# Patient Record
Sex: Female | Born: 1965
Health system: Southern US, Community
[De-identification: ages and names within clinical notes are randomized; demographics above are authoritative.]

## PROBLEM LIST (undated history)

## (undated) DIAGNOSIS — K219 Gastro-esophageal reflux disease without esophagitis: Secondary | ICD-10-CM

## (undated) DIAGNOSIS — F419 Anxiety disorder, unspecified: Secondary | ICD-10-CM

## (undated) DIAGNOSIS — N189 Chronic kidney disease, unspecified: Secondary | ICD-10-CM

## (undated) HISTORY — PX: LAPAROSCOPIC GASTRIC BYPASS: SUR771

## (undated) HISTORY — PX: KIDNEY STONE SURGERY: SHX686

## (undated) HISTORY — PX: ABDOMINAL HYSTERECTOMY: SHX81

## (undated) HISTORY — PX: COLONOSCOPY: SHX174

---

## 2003-01-22 HISTORY — PX: BREAST BIOPSY: SHX20

## 2005-03-13 ENCOUNTER — Ambulatory Visit: Payer: Self-pay | Admitting: Internal Medicine

## 2005-04-30 ENCOUNTER — Ambulatory Visit: Payer: Self-pay | Admitting: Internal Medicine

## 2006-08-05 ENCOUNTER — Ambulatory Visit: Payer: Self-pay | Admitting: Internal Medicine

## 2006-11-06 ENCOUNTER — Ambulatory Visit: Payer: Self-pay | Admitting: Emergency Medicine

## 2006-12-31 ENCOUNTER — Ambulatory Visit: Payer: Self-pay | Admitting: Internal Medicine

## 2007-01-06 ENCOUNTER — Ambulatory Visit: Payer: Self-pay | Admitting: Internal Medicine

## 2007-03-31 ENCOUNTER — Ambulatory Visit: Payer: Self-pay | Admitting: Family Medicine

## 2007-04-07 ENCOUNTER — Ambulatory Visit: Payer: Self-pay | Admitting: Internal Medicine

## 2007-04-10 ENCOUNTER — Ambulatory Visit: Payer: Self-pay | Admitting: Family Medicine

## 2007-04-30 ENCOUNTER — Ambulatory Visit: Payer: Self-pay | Admitting: Specialist

## 2007-05-15 ENCOUNTER — Ambulatory Visit: Payer: Self-pay | Admitting: Family Medicine

## 2007-06-11 ENCOUNTER — Ambulatory Visit: Payer: Self-pay | Admitting: Specialist

## 2007-07-13 ENCOUNTER — Ambulatory Visit: Payer: Self-pay | Admitting: Specialist

## 2008-03-23 ENCOUNTER — Ambulatory Visit: Payer: Self-pay | Admitting: Internal Medicine

## 2009-02-10 ENCOUNTER — Ambulatory Visit: Payer: Self-pay | Admitting: Gastroenterology

## 2009-02-19 IMAGING — RF DG UGI W/ KUB
2 series · 15 of 21 positions shown · non-contrast
Comparison: none

REASON FOR EXAM: dysphagia     reflux
COMMENTS:

[Series 1: run · 14 of 20 slices shown]
[im 1/20]
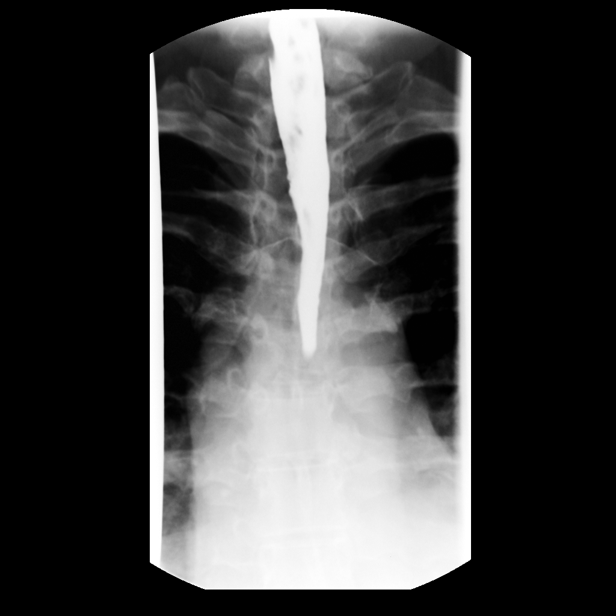
[im 3/20]
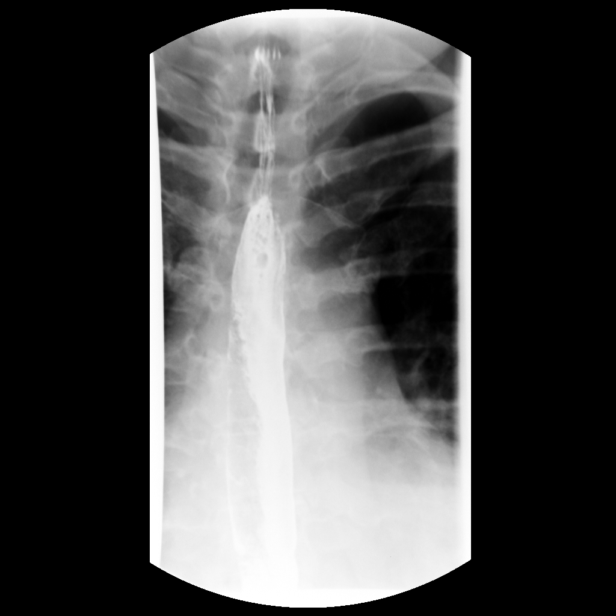
[im 4/20]
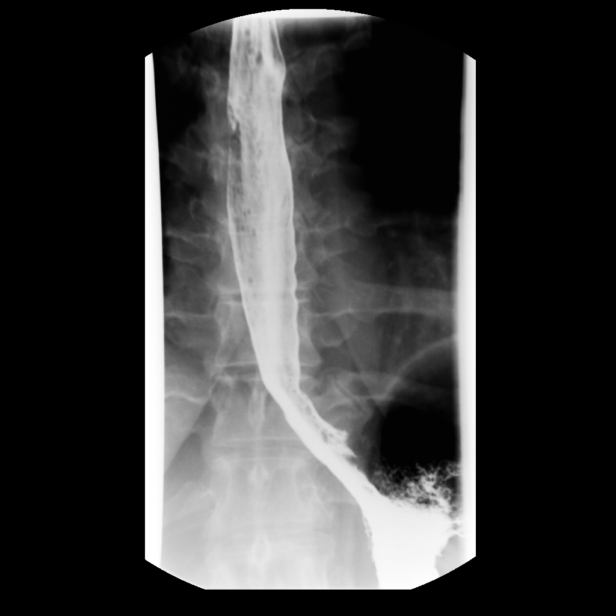
[im 5/20]
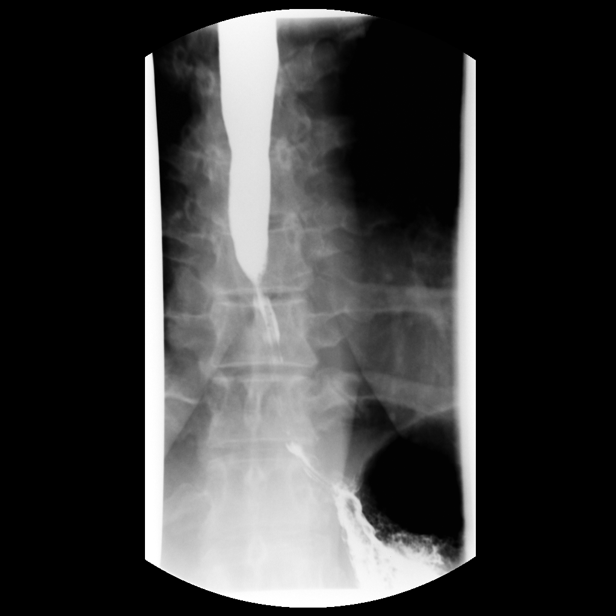
[im 7/20]
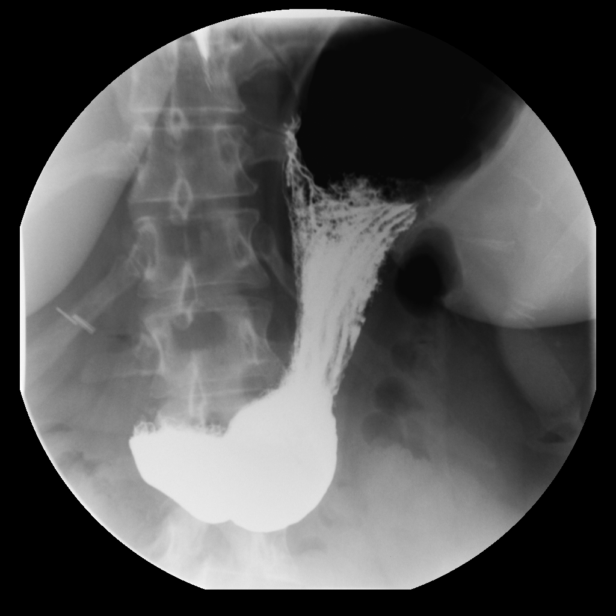
[im 8/20]
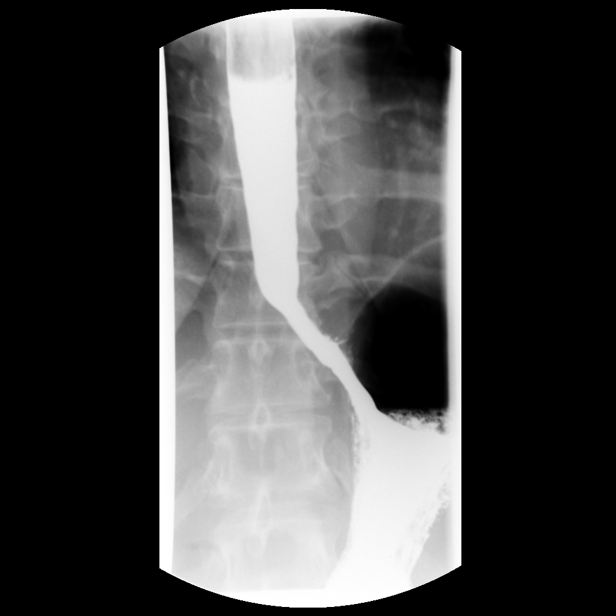
[im 10/20]
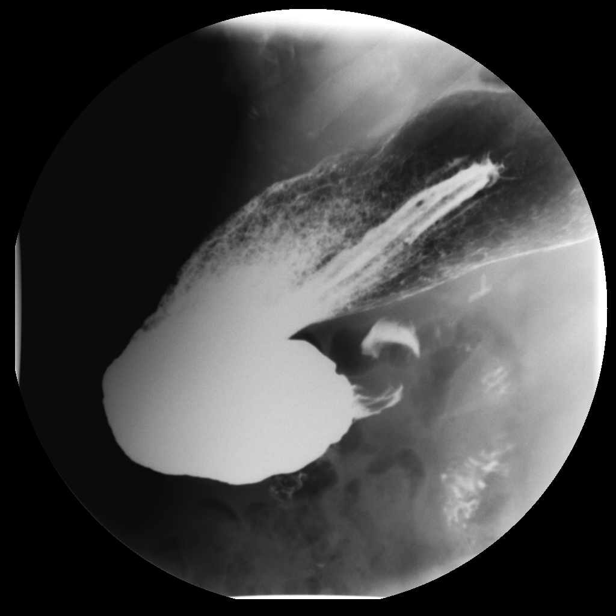
[im 11/20]
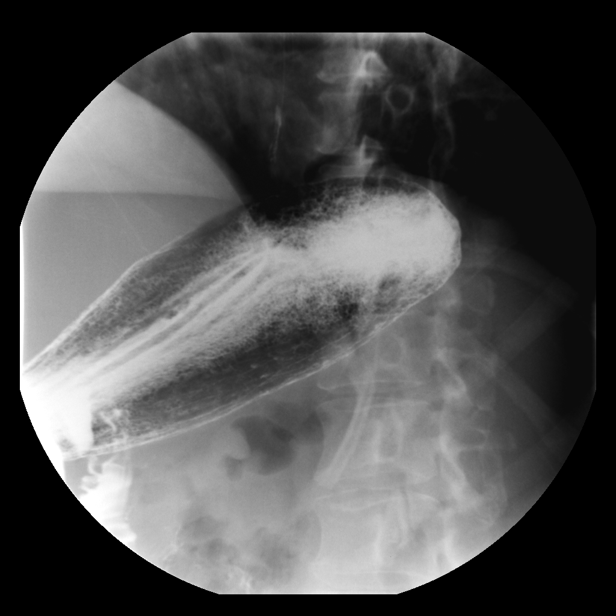
[im 12/20]
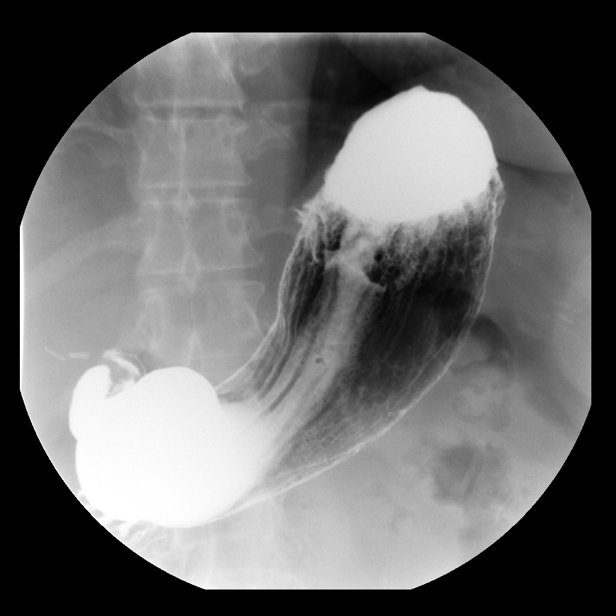
[im 14/20]
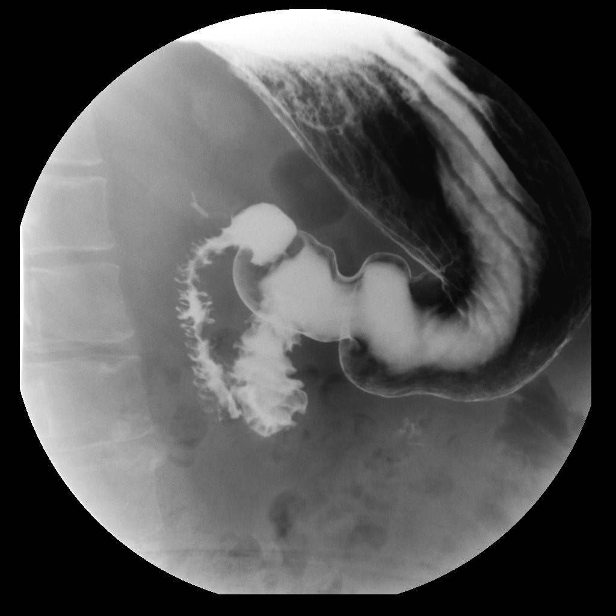
[im 15/20]
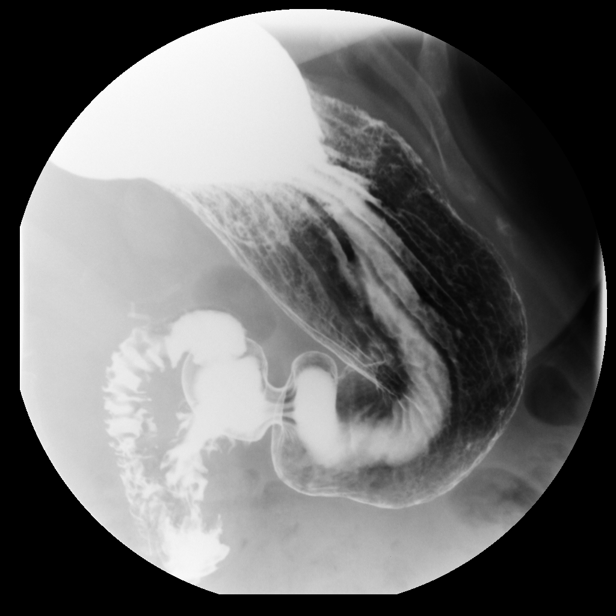
[im 17/20]
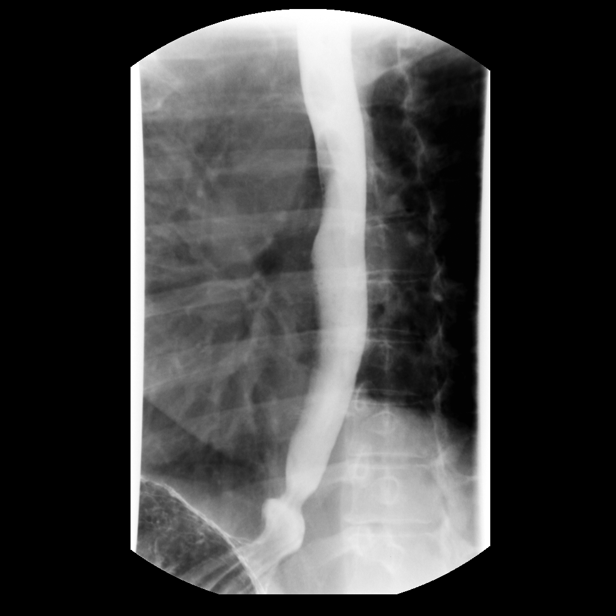
[im 18/20]
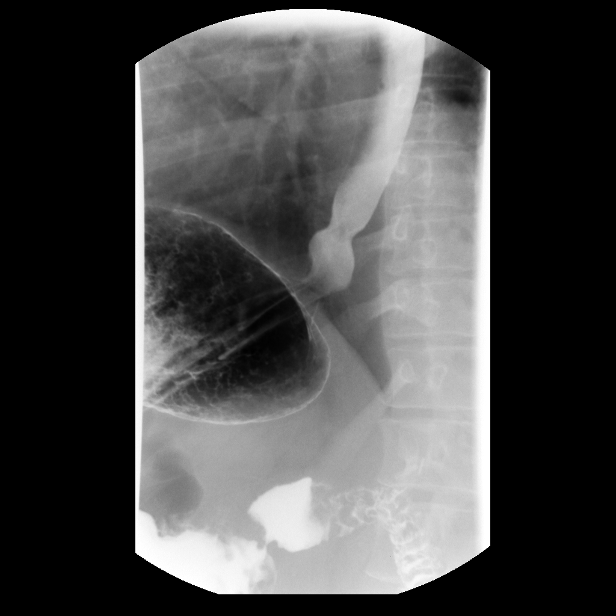
[im 19/20]
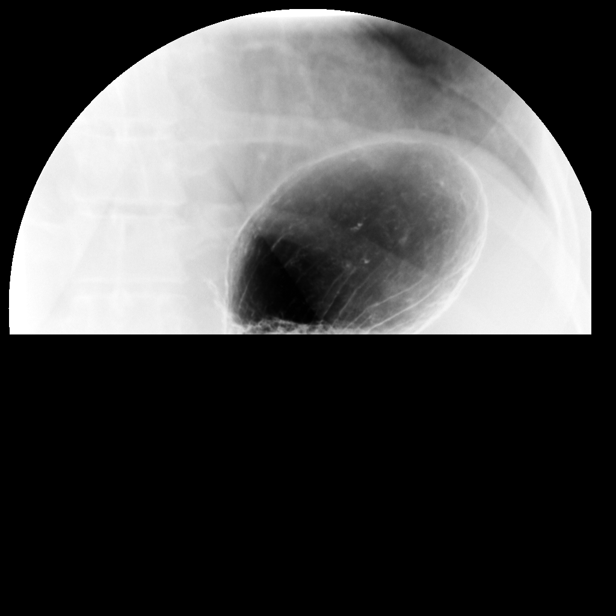

[Series 1: view not recorded · 0.17mm/px · 1 of 1 slices shown]
[im 1/1]
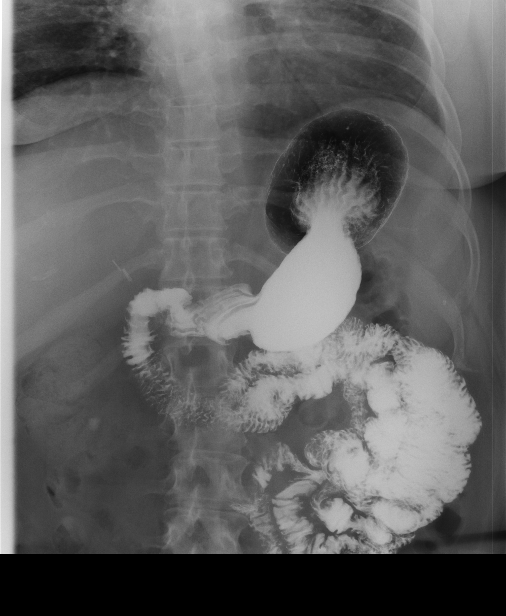

[15 of 21 positions shown; findings below may reference images not displayed]

PROCEDURE:     FL  - FL UPPER GI W/ BARIUM SWALLOW  - December 31, 2006  [DATE]

RESULT:     A double-contrast upper GI and barium swallow is performed. The
patient easily ingests the liquid barium. The esophageal mucosa and
morphology are normal. The stomach distends fully. The gastric mucosa is
unremarkable. The proximal small bowel is within normal limits. When the
patient is in a supine position there is a small sliding-type hiatal hernia.
No gastroesophageal reflux could be observed. With prone swallowing there is
proximal escape and some minimal distal esophageal spasm.
IMPRESSION: 1. No obstruction or aspiration.
2. There is a small intermittent sliding-type hiatal hernia.
3. There is no gastroesophageal reflux. There is some intermittent minimal
distal esophageal spasm.
4. Cholecystectomy clips are noted incidentally.
5. The proximal small bowel is unremarkable in appearance.

## 2009-02-24 ENCOUNTER — Ambulatory Visit: Payer: Self-pay | Admitting: Gastroenterology

## 2009-05-01 ENCOUNTER — Ambulatory Visit: Payer: Self-pay | Admitting: Specialist

## 2009-08-28 ENCOUNTER — Ambulatory Visit: Payer: Self-pay | Admitting: Internal Medicine

## 2009-12-04 ENCOUNTER — Ambulatory Visit (HOSPITAL_COMMUNITY)
Admission: RE | Admit: 2009-12-04 | Discharge: 2009-12-05 | Payer: Self-pay | Source: Home / Self Care | Admitting: Urology

## 2010-04-03 LAB — CBC
HCT: 40 % (ref 36.0–46.0)
Hemoglobin: 13.8 g/dL (ref 12.0–15.0)
MCH: 31.7 pg (ref 26.0–34.0)
MCHC: 34.6 g/dL (ref 30.0–36.0)
MCV: 91.7 fL (ref 78.0–100.0)
Platelets: 224 10*3/uL (ref 150–400)
RBC: 4.36 MIL/uL (ref 3.87–5.11)
RDW: 12.8 % (ref 11.5–15.5)
WBC: 7.9 10*3/uL (ref 4.0–10.5)

## 2010-04-03 LAB — SURGICAL PCR SCREEN
MRSA, PCR: NEGATIVE
Staphylococcus aureus: NEGATIVE

## 2010-09-13 ENCOUNTER — Ambulatory Visit: Payer: Self-pay | Admitting: Internal Medicine

## 2011-02-22 ENCOUNTER — Ambulatory Visit: Payer: Self-pay

## 2011-04-16 ENCOUNTER — Ambulatory Visit: Payer: Self-pay

## 2011-04-16 IMAGING — CR DG BE W/ AIR HIGH DENSITY
1 series · 8 of 10 positions shown · non-contrast
Comparison: none

REASON FOR EXAM: incomplete colonscopy scr
COMMENTS:

[Series 1: view not recorded · 0.17mm/px · 8 of 13 slices shown]
[im 1/13]
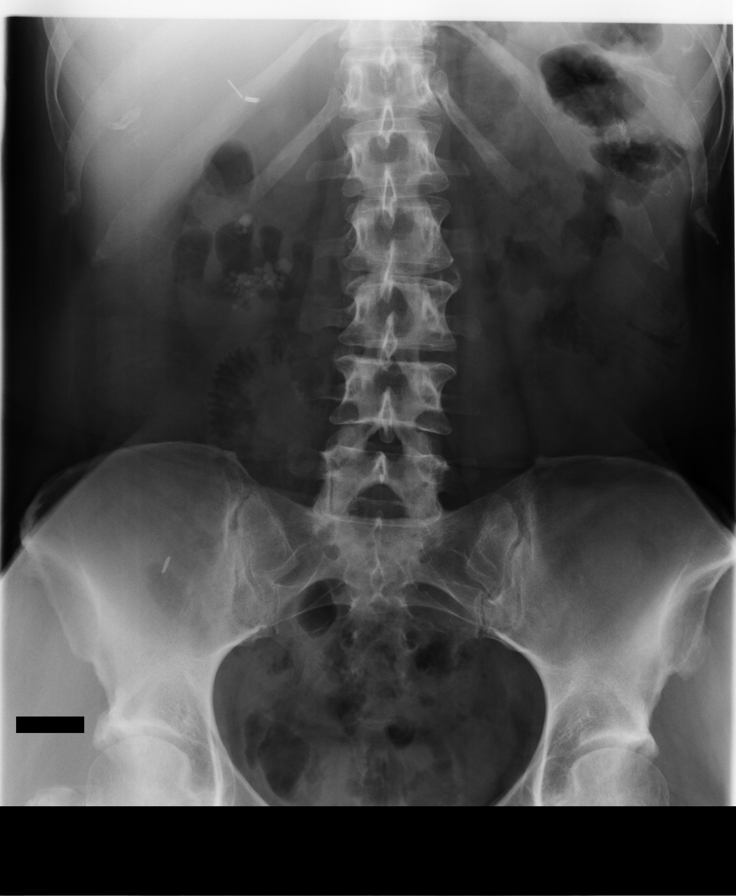
[im 2/13]
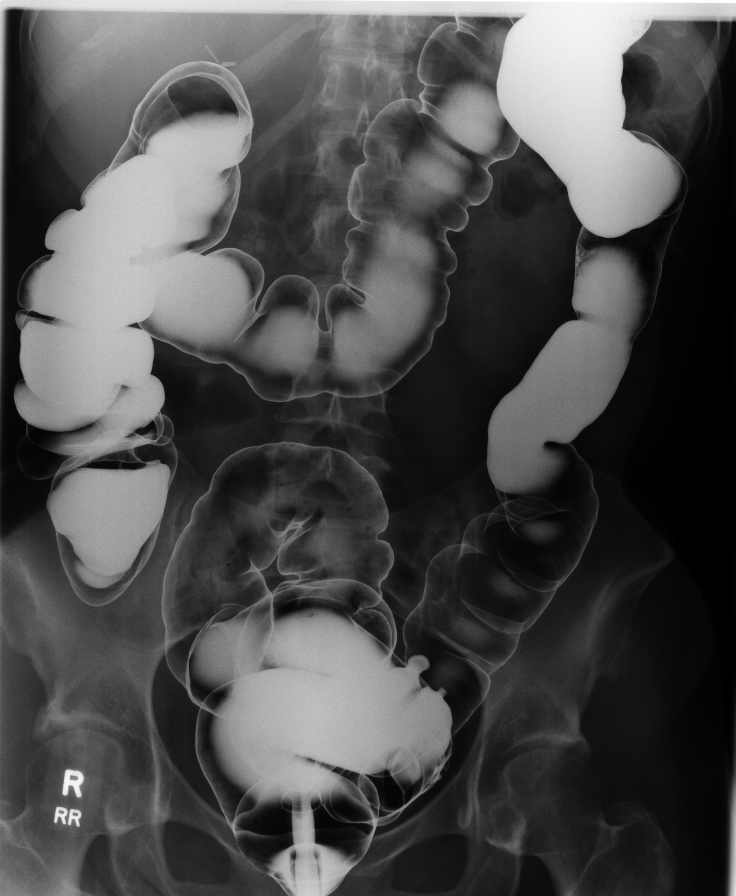
[im 3/13]
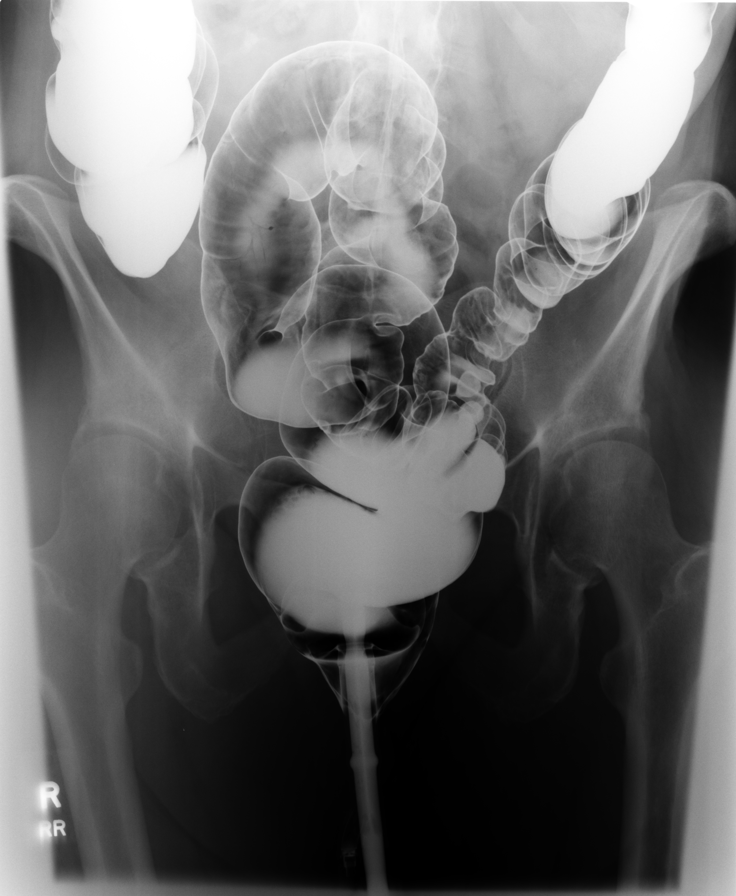
[im 5/13]
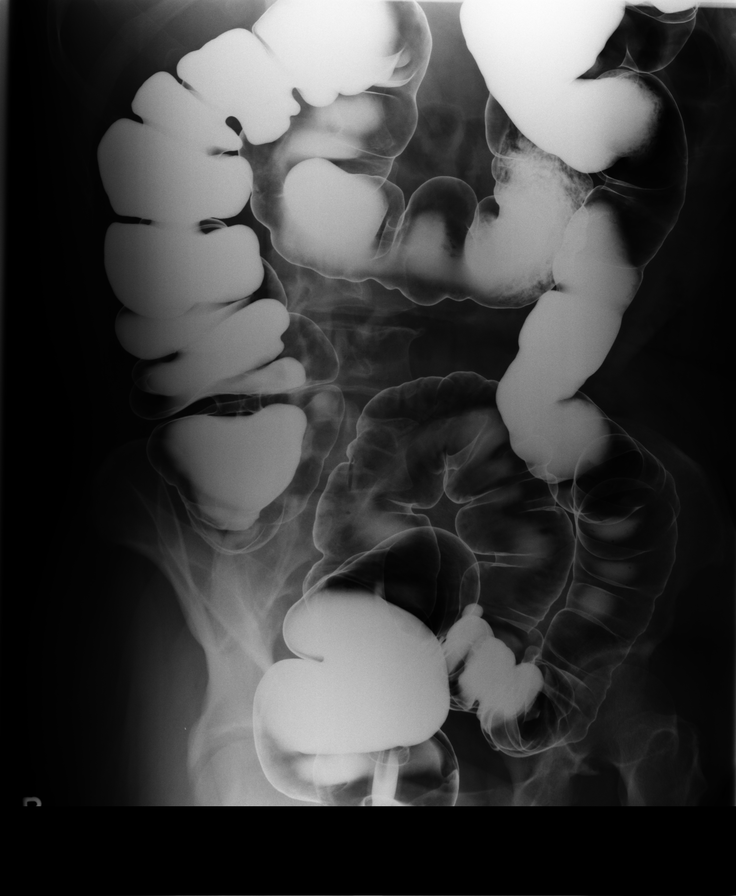
[im 6/13]
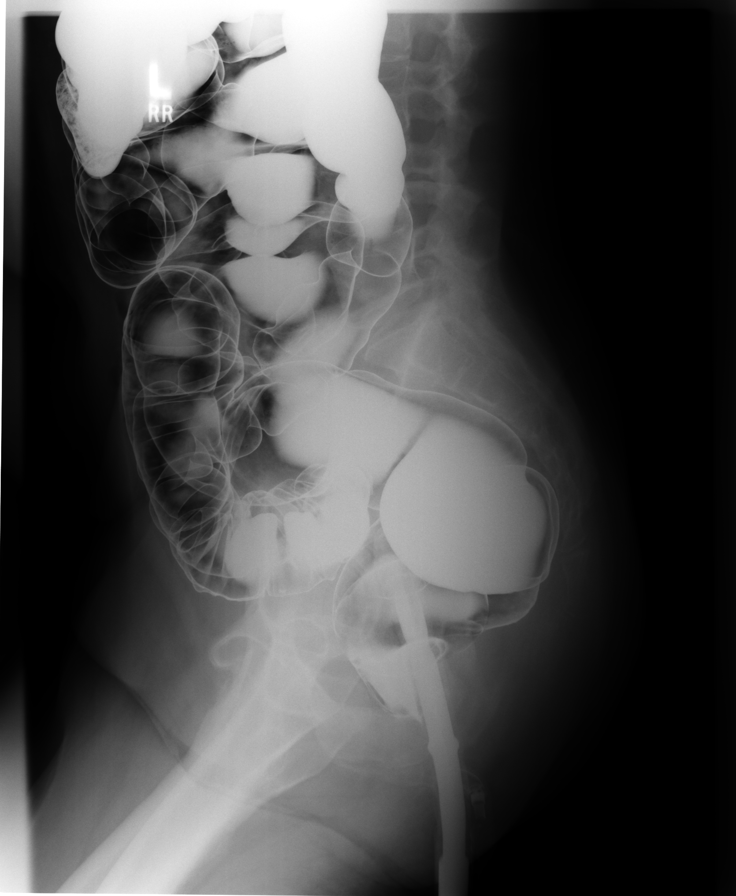
[im 7/13]
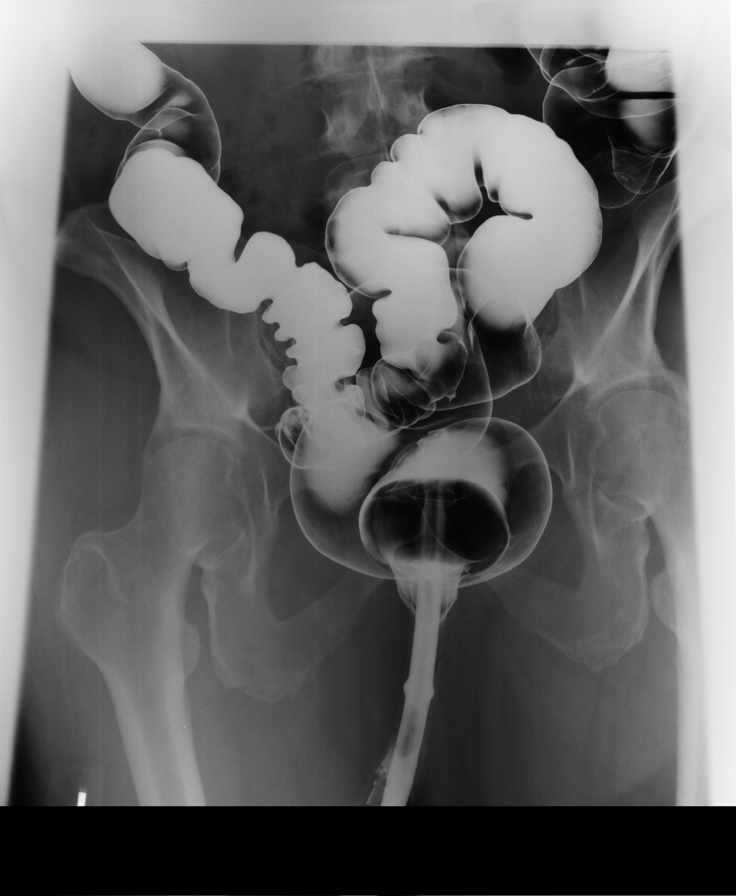
[im 9/13]
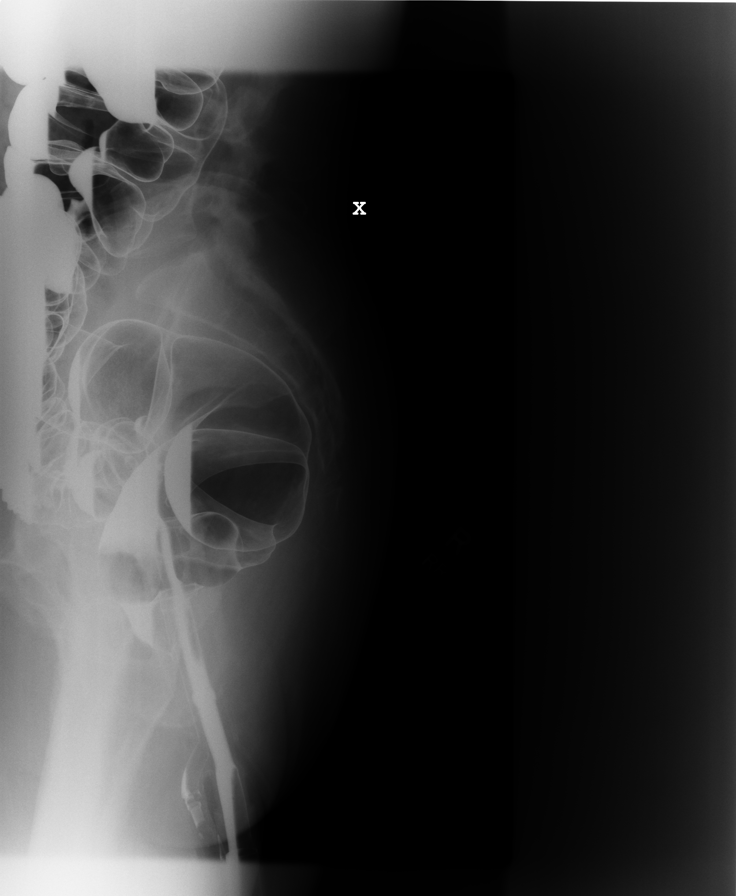
[im 10/13]
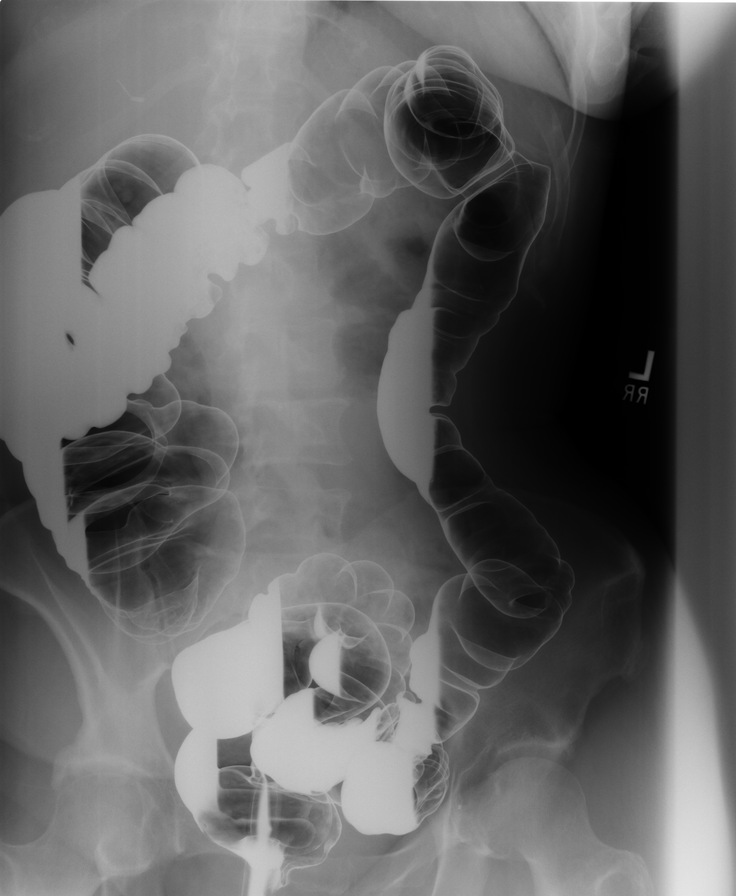

[8 of 10 positions shown; findings below may reference images not displayed]

PROCEDURE:     FL  - FL BARIUM ENEMA WITH AIR COLON  - February 24, 2009  [DATE]

RESULT:     The anticipated procedure was discussed with Ms. Siviora. She
voiced her willingness to proceed. The scout film reveals numerous calcified
stones projecting over the right renal collecting systems. There are
surgical clips in the gallbladder fossa and over the right iliac bone. There
is a moderate amount of gas within bowel but no significant stool was
identified.

The barium catheter balloon was inflated under fluoroscopic guidance. Barium
was then instilled into the colon followed by drainage in an insufflation of
air. Adequate coating and distention of the entire large bowel was obtained.

There are redundant loops of bowel in the rectosigmoid region and in the
region of the hepatic flexure. No annular constricting lesions are
identified. There are a few sigmoid diverticula without evidence of acute
diverticulitis. The haustral pattern appears normal. The appendix is
surgically absent. No significant reflux into the terminal ileum is
demonstrated. The cecum is normal in contour.
IMPRESSION: 1. I do not see evidence of annular constricting lesions nor of intraluminal
polypoid masses.
2. There are scattered diverticula noted predominantly in the sigmoid
region, but there is no evidence of acute diverticulitis nor other forms of
colitis.
3. I see no extrinsic impression upon the colonic loops.

## 2011-09-24 ENCOUNTER — Ambulatory Visit: Payer: Self-pay

## 2011-10-21 ENCOUNTER — Ambulatory Visit: Payer: Self-pay | Admitting: Urology

## 2011-10-21 DIAGNOSIS — N23 Unspecified renal colic: Secondary | ICD-10-CM | POA: Insufficient documentation

## 2011-10-21 DIAGNOSIS — N200 Calculus of kidney: Secondary | ICD-10-CM | POA: Insufficient documentation

## 2011-10-21 DIAGNOSIS — M545 Low back pain, unspecified: Secondary | ICD-10-CM | POA: Insufficient documentation

## 2012-06-30 ENCOUNTER — Ambulatory Visit: Payer: Self-pay | Admitting: Family Medicine

## 2012-09-07 ENCOUNTER — Ambulatory Visit: Payer: Self-pay | Admitting: Physician Assistant

## 2012-09-07 LAB — URINALYSIS, COMPLETE
Glucose,UR: NEGATIVE mg/dL (ref 0–75)
Ketone: NEGATIVE
Nitrite: POSITIVE
Ph: 6 (ref 4.5–8.0)
Protein: 30
RBC,UR: >30 /HPF (ref 0–5)
Specific Gravity: 1.02 (ref 1.003–1.030)

## 2012-09-09 LAB — URINE CULTURE

## 2012-09-30 ENCOUNTER — Ambulatory Visit: Payer: Self-pay | Admitting: Physician Assistant

## 2012-10-22 ENCOUNTER — Ambulatory Visit: Payer: Self-pay | Admitting: Urology

## 2012-11-05 ENCOUNTER — Ambulatory Visit: Payer: Self-pay | Admitting: Urology

## 2012-12-24 ENCOUNTER — Ambulatory Visit: Payer: Self-pay

## 2012-12-30 ENCOUNTER — Ambulatory Visit: Payer: Self-pay

## 2013-09-06 ENCOUNTER — Ambulatory Visit: Payer: Self-pay | Admitting: Physician Assistant

## 2013-09-06 LAB — RAPID STREP-A WITH REFLX: Micro Text Report: NEGATIVE

## 2013-09-08 LAB — BETA STREP CULTURE(ARMC)

## 2013-10-06 ENCOUNTER — Ambulatory Visit: Payer: Self-pay | Admitting: Internal Medicine

## 2014-01-12 ENCOUNTER — Ambulatory Visit: Payer: Self-pay | Admitting: Physician Assistant

## 2014-10-13 ENCOUNTER — Other Ambulatory Visit: Payer: Self-pay | Admitting: Physician Assistant

## 2014-10-13 DIAGNOSIS — R109 Unspecified abdominal pain: Secondary | ICD-10-CM

## 2014-10-13 DIAGNOSIS — R319 Hematuria, unspecified: Secondary | ICD-10-CM

## 2014-10-19 ENCOUNTER — Ambulatory Visit
Admission: RE | Admit: 2014-10-19 | Discharge: 2014-10-19 | Disposition: A | Discharge status: Home / Self Care | Payer: BLUE CROSS/BLUE SHIELD | Source: Ambulatory Visit | Attending: Physician Assistant | Admitting: Physician Assistant

## 2014-10-19 DIAGNOSIS — R319 Hematuria, unspecified: Secondary | ICD-10-CM

## 2014-10-19 DIAGNOSIS — R109 Unspecified abdominal pain: Secondary | ICD-10-CM | POA: Insufficient documentation

## 2014-12-12 IMAGING — CR DG ABDOMEN 1V
1 series · 2 of 2 positions shown · non-contrast
Comparison: none

REASON FOR EXAM: pre-op
COMMENTS:

[Series 1: t abdomen supine · 0.14mm/px · 2 of 2 slices shown]
[im 1/2]
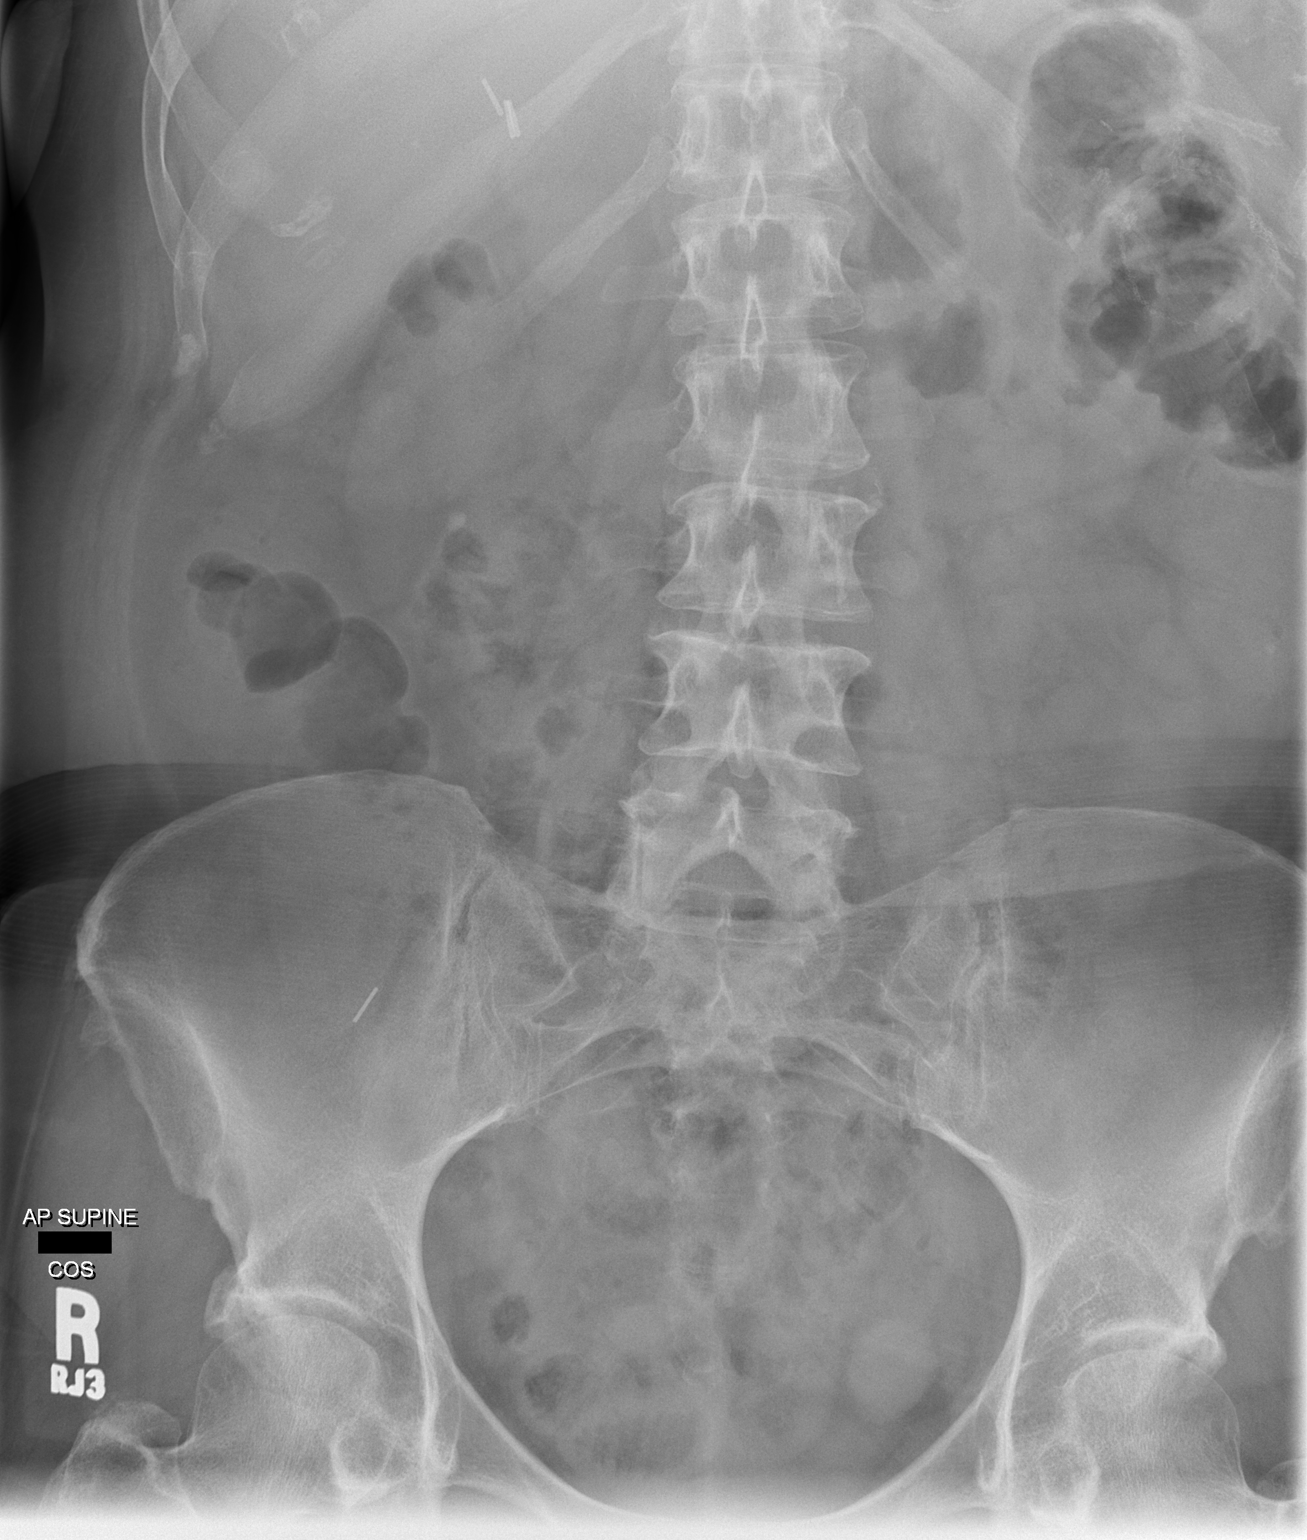
[im 2/2]
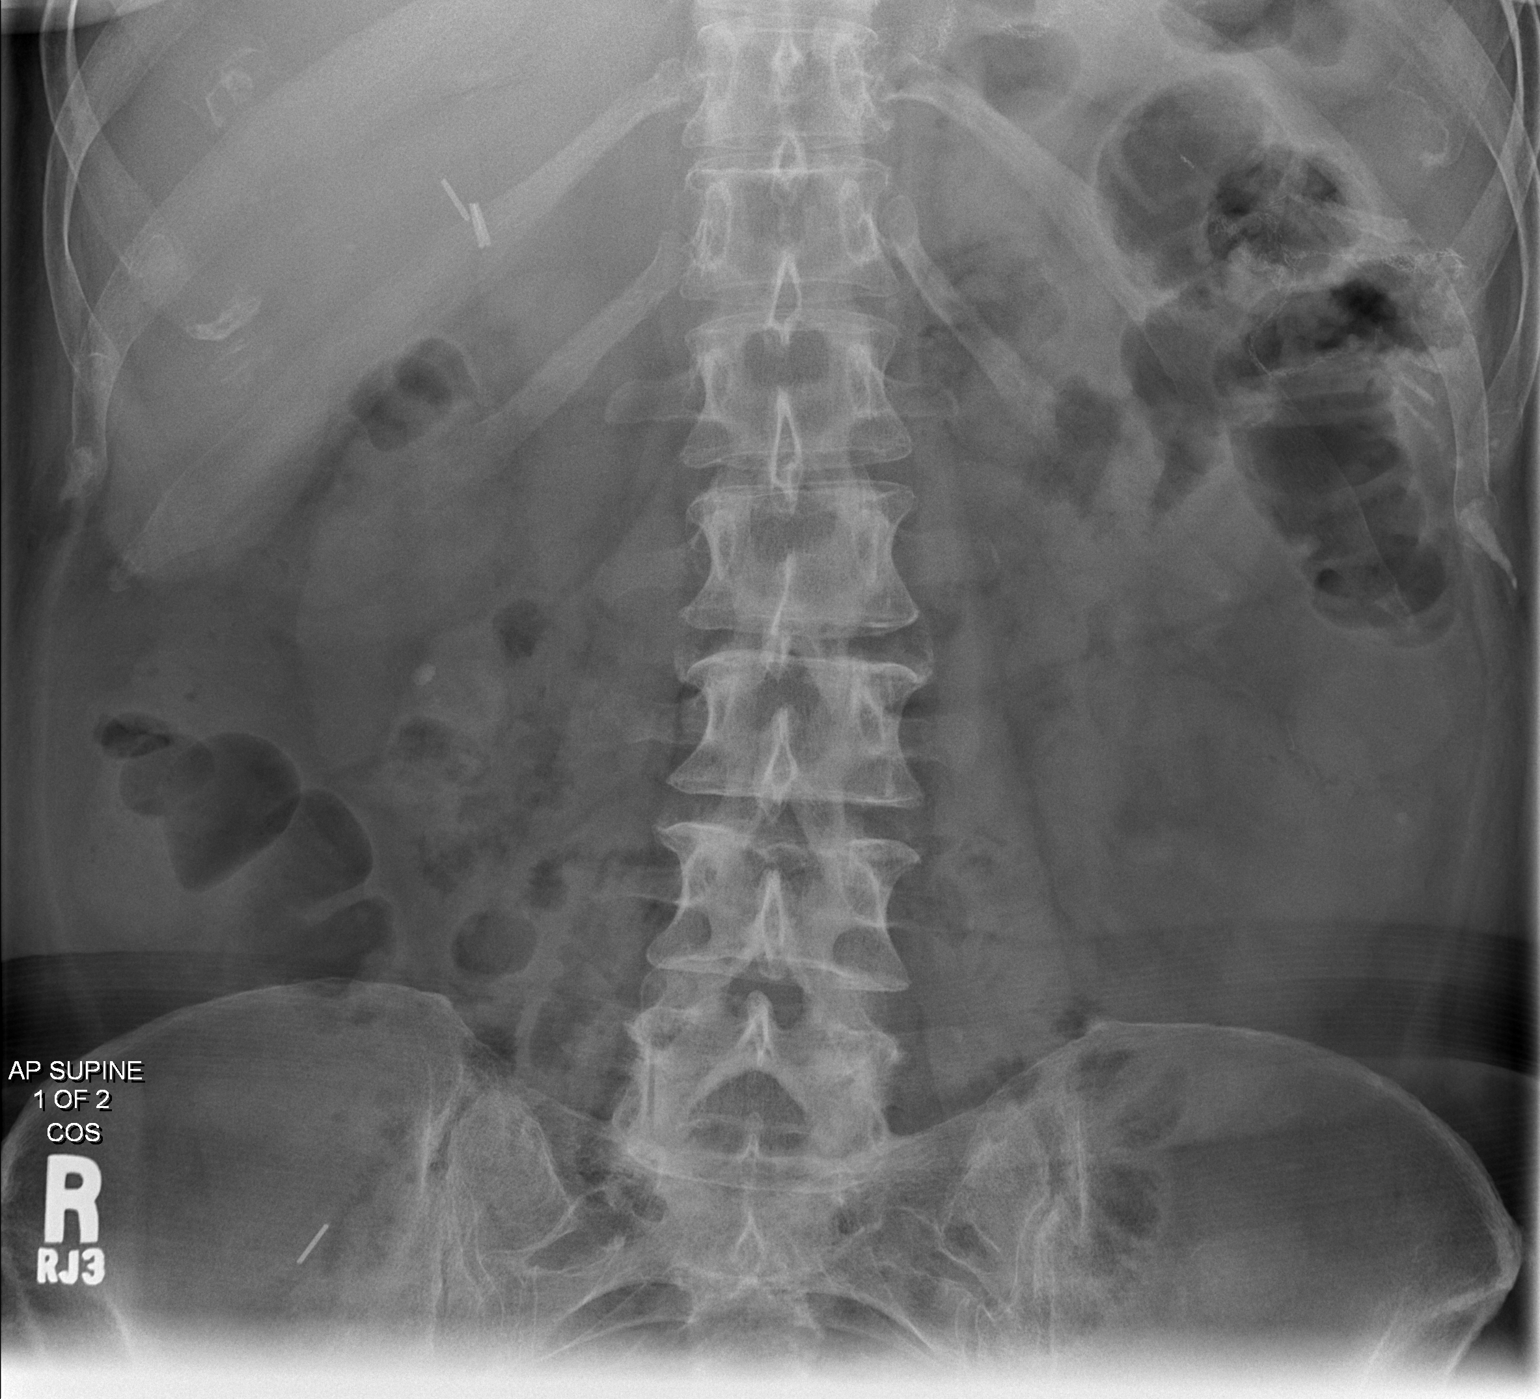

[2 of 2 positions shown; findings below may reference images not displayed]

PROCEDURE:     DXR - DXR KIDNEY URETER BLADDER  - October 22, 2012 [DATE]

RESULT:     Comparison made to the study September 30, 2012.

The bowel gas pattern is nonspecific. There is a coarse calcification
projecting over the lower pole of the right kidney measuring approximately 3
mm in diameter. No definite stones are demonstrated on the left. No definite
bladder stones are demonstrated.
IMPRESSION: There is an approximately 3 mm diameter lower pole stone
projecting over the lower pole of the right kidney.

[REDACTED]

## 2015-02-02 ENCOUNTER — Other Ambulatory Visit: Payer: Self-pay | Admitting: Nurse Practitioner

## 2015-02-02 DIAGNOSIS — Z1231 Encounter for screening mammogram for malignant neoplasm of breast: Secondary | ICD-10-CM

## 2015-02-13 ENCOUNTER — Ambulatory Visit
Admission: RE | Admit: 2015-02-13 | Discharge: 2015-02-13 | Disposition: A | Discharge status: Home / Self Care | Payer: Commercial Managed Care - HMO | Source: Ambulatory Visit | Attending: Nurse Practitioner | Admitting: Nurse Practitioner

## 2015-02-13 DIAGNOSIS — Z1231 Encounter for screening mammogram for malignant neoplasm of breast: Secondary | ICD-10-CM | POA: Diagnosis not present

## 2015-02-13 IMAGING — CR DG KNEE COMPLETE 4+V*R*
1 series · 4 of 4 positions shown · non-contrast
Comparison: None.

CLINICAL DATA: Right knee pain.

EXAM:
RIGHT KNEE - COMPLETE 4+ VIEW

[Series 1: ap · 0.17mm/px · 4 of 4 slices shown]
[im 1/4]
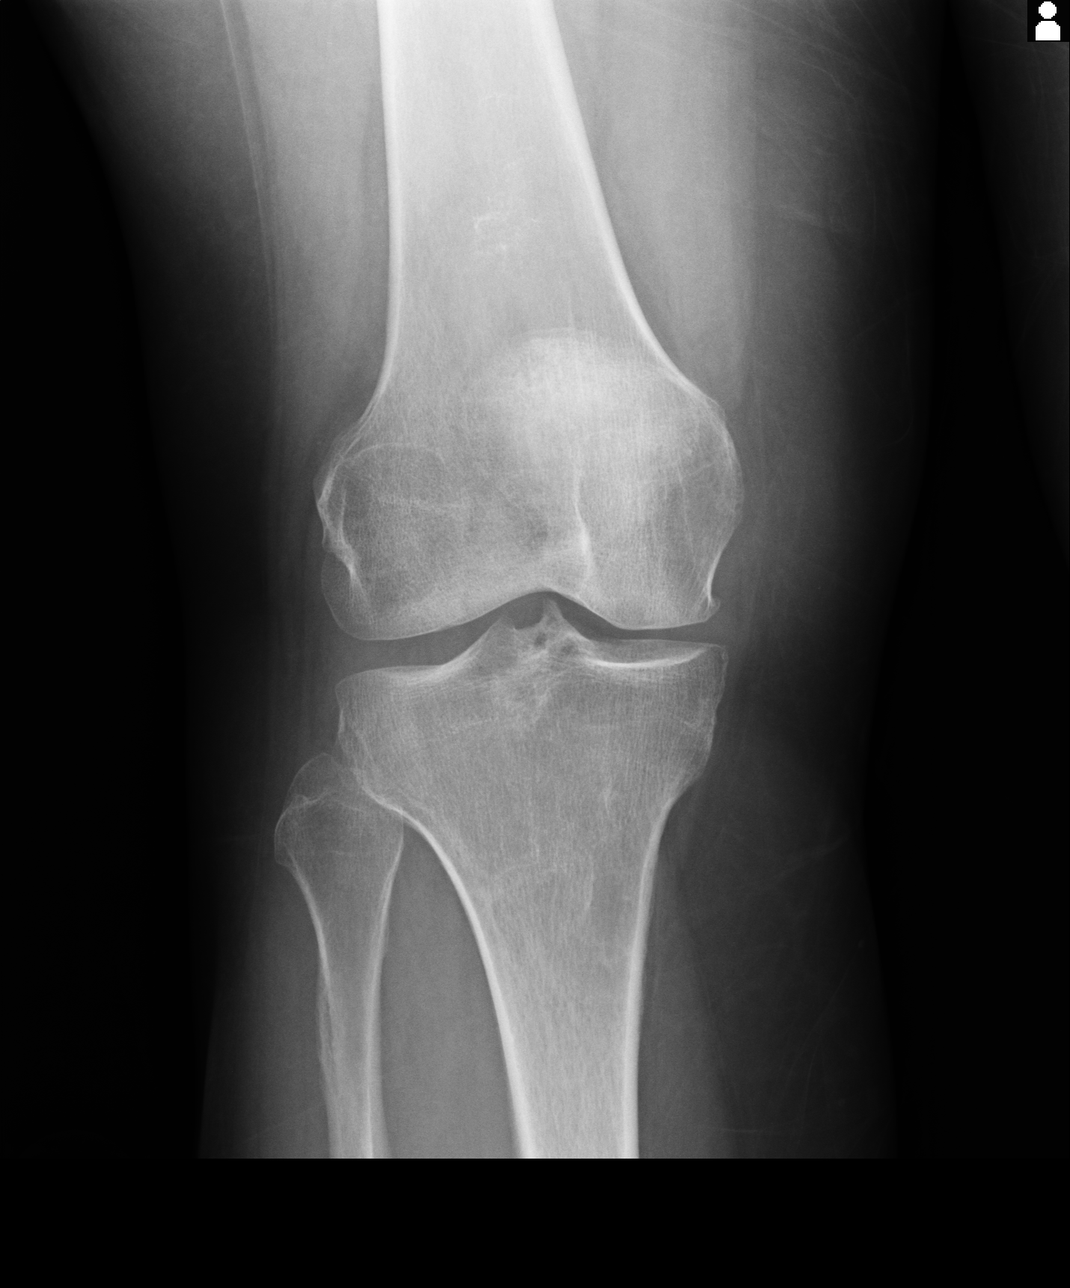
[im 2/4]
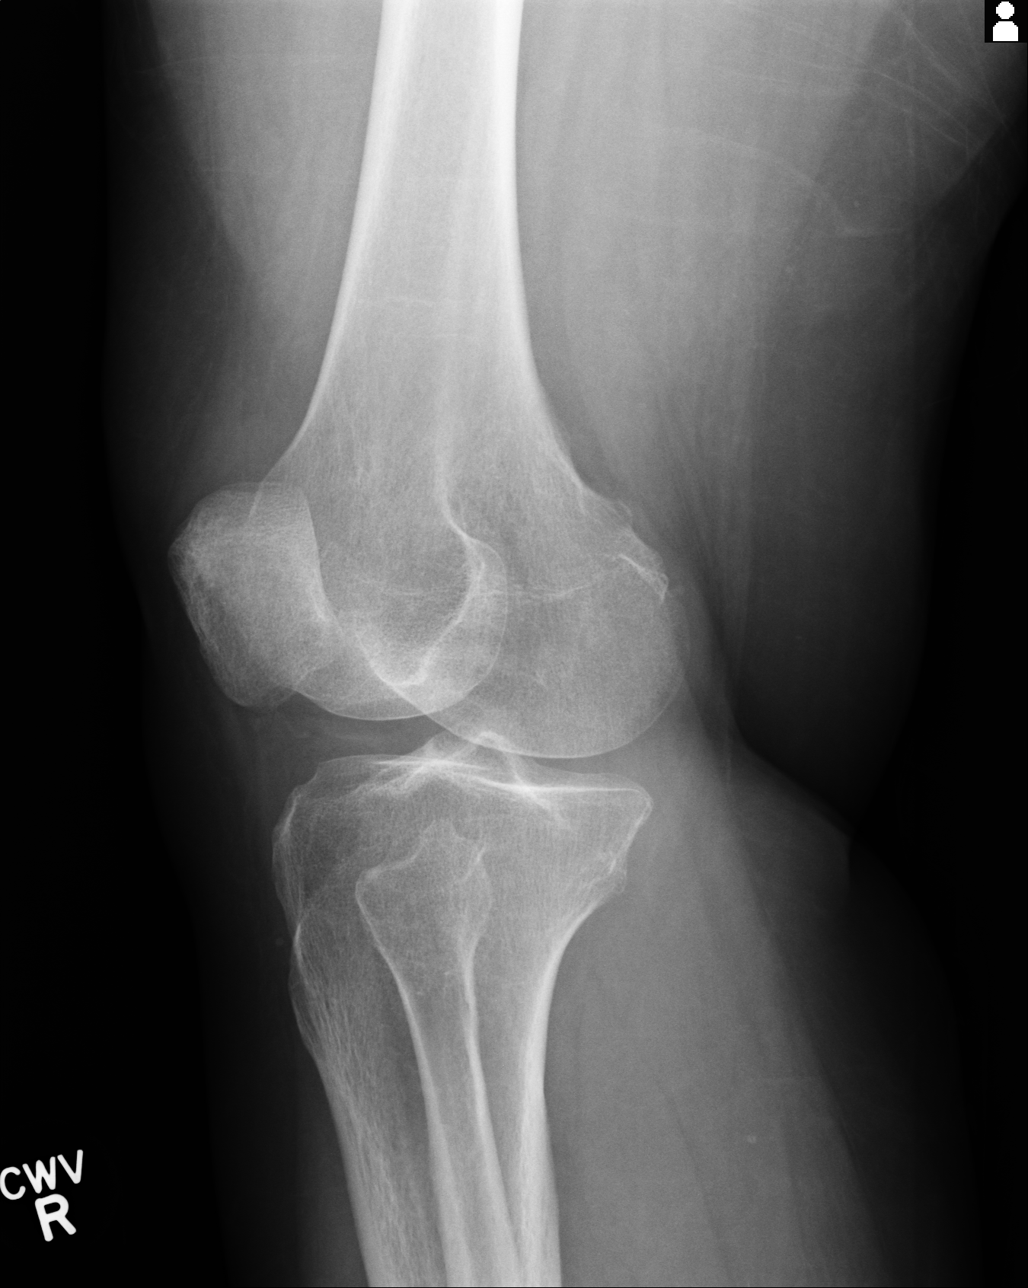
[im 3/4]
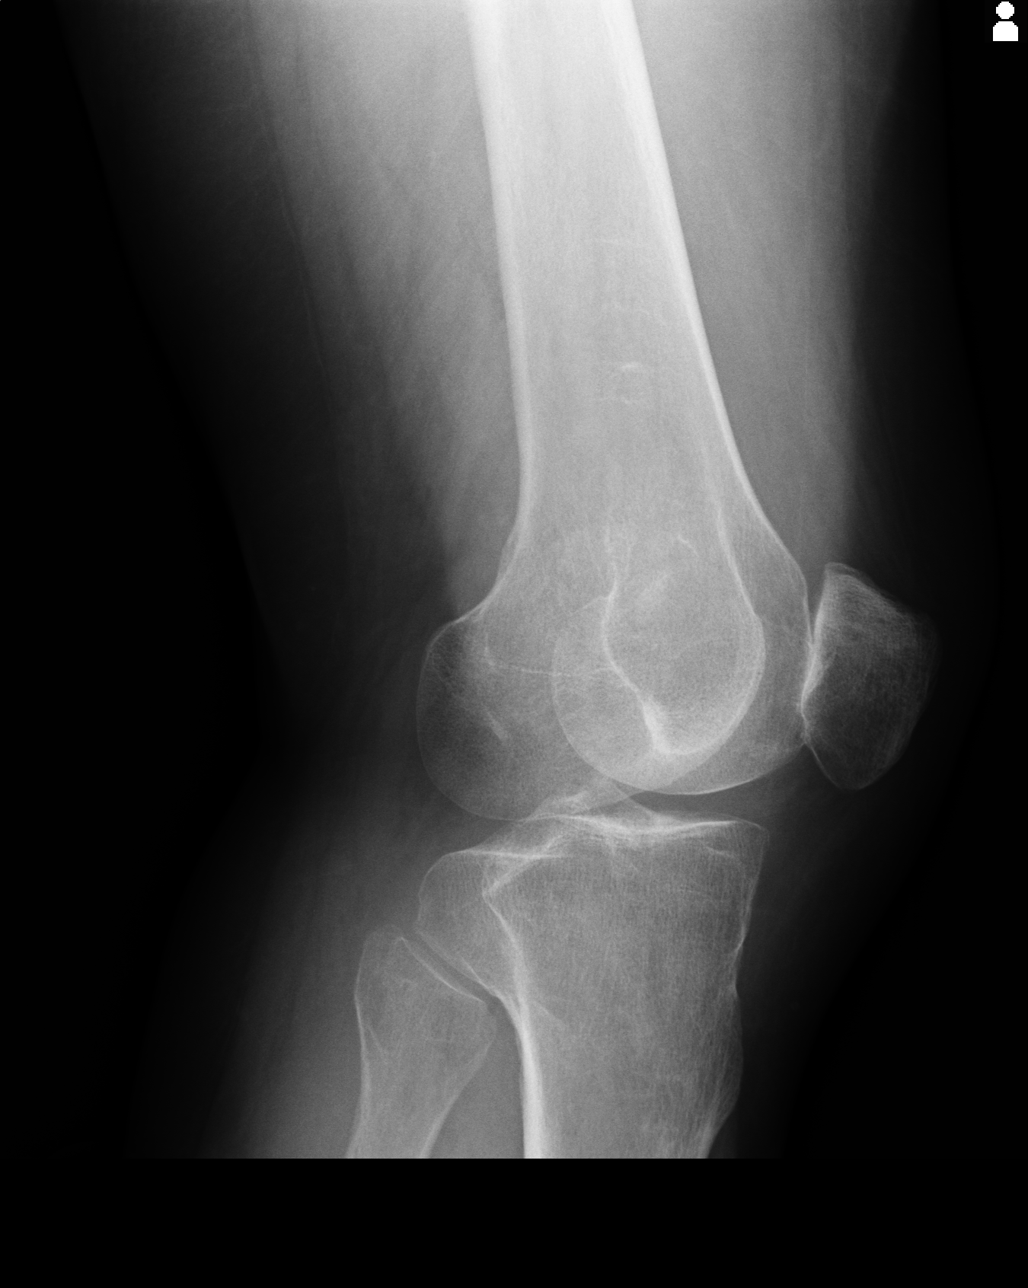
[im 4/4]
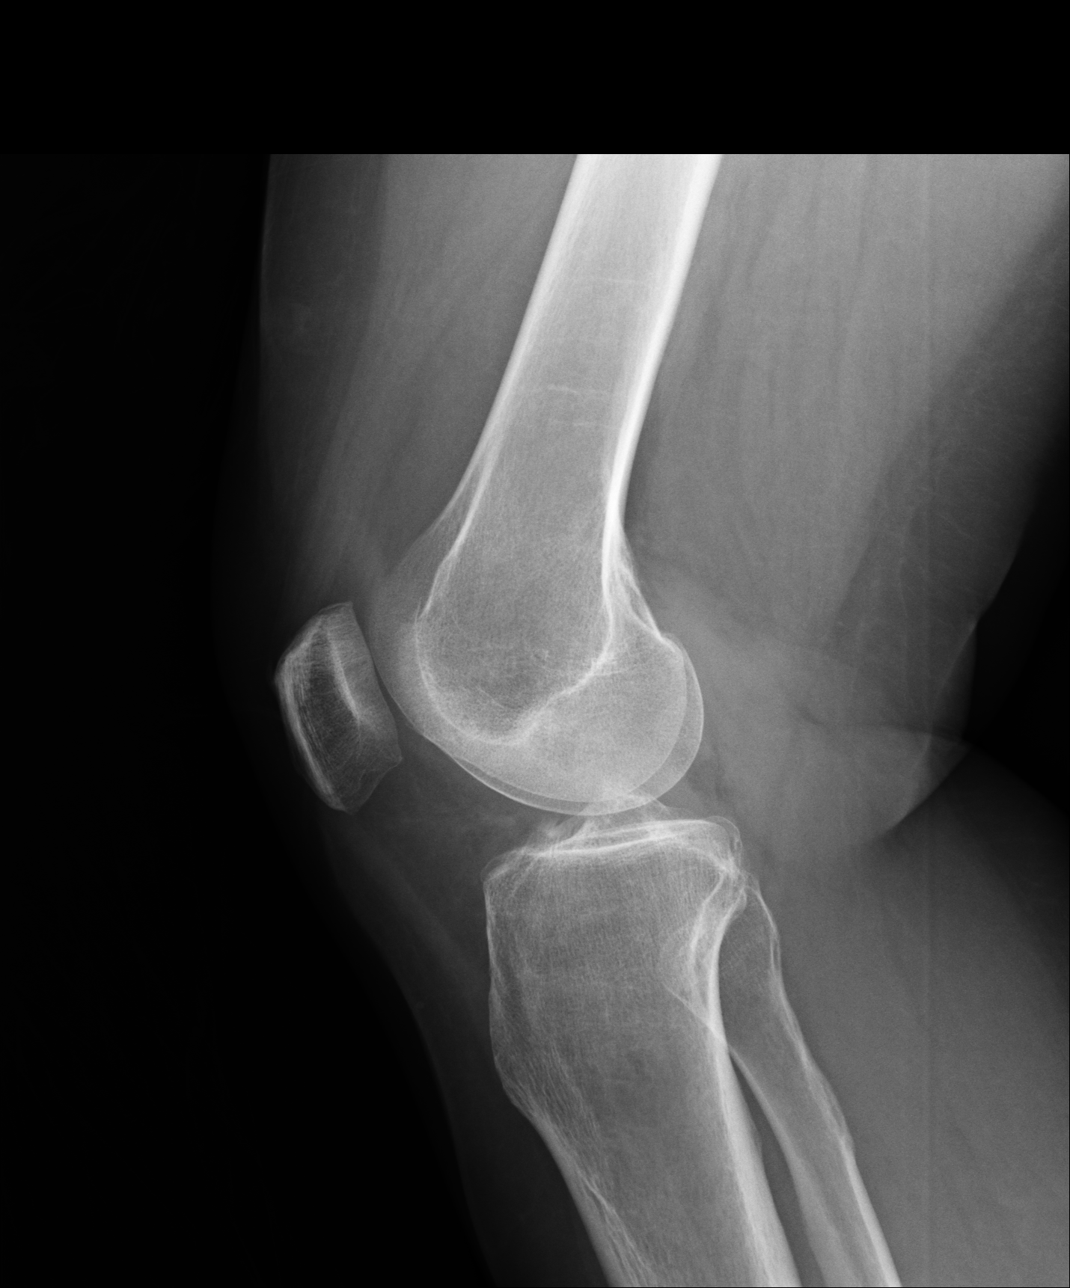

[4 of 4 positions shown; findings below may reference images not displayed]

FINDINGS: No fracture or dislocation is noted. No joint effusion is noted.
Mild narrowing of the medial joint space is noted with minimal
osteophyte formation. No soft tissue abnormality is noted.
IMPRESSION: Mild degenerative joint disease is noted involving the medial joint
space. No acute abnormality seen in the right knee.

## 2015-10-10 ENCOUNTER — Other Ambulatory Visit: Payer: Self-pay | Admitting: Nurse Practitioner

## 2015-10-10 DIAGNOSIS — R059 Cough, unspecified: Secondary | ICD-10-CM

## 2015-10-10 DIAGNOSIS — R05 Cough: Secondary | ICD-10-CM

## 2015-10-11 ENCOUNTER — Ambulatory Visit
Admission: RE | Admit: 2015-10-11 | Discharge: 2015-10-11 | Disposition: A | Discharge status: Home / Self Care | Payer: 59 | Source: Ambulatory Visit | Attending: Nurse Practitioner | Admitting: Nurse Practitioner

## 2015-10-11 DIAGNOSIS — R05 Cough: Secondary | ICD-10-CM

## 2015-10-11 DIAGNOSIS — R059 Cough, unspecified: Secondary | ICD-10-CM

## 2016-01-30 DIAGNOSIS — K5909 Other constipation: Secondary | ICD-10-CM | POA: Diagnosis not present

## 2016-01-30 DIAGNOSIS — R1084 Generalized abdominal pain: Secondary | ICD-10-CM | POA: Diagnosis not present

## 2016-02-21 DIAGNOSIS — R3129 Other microscopic hematuria: Secondary | ICD-10-CM | POA: Diagnosis not present

## 2016-02-21 DIAGNOSIS — N3020 Other chronic cystitis without hematuria: Secondary | ICD-10-CM | POA: Diagnosis not present

## 2016-02-21 DIAGNOSIS — Z6829 Body mass index (BMI) 29.0-29.9, adult: Secondary | ICD-10-CM | POA: Diagnosis not present

## 2016-04-01 ENCOUNTER — Encounter: Payer: Self-pay | Admitting: *Deleted

## 2016-04-02 ENCOUNTER — Encounter: Payer: Self-pay | Admitting: *Deleted

## 2016-04-02 ENCOUNTER — Ambulatory Visit
Admission: RE | Admit: 2016-04-02 | Discharge: 2016-04-02 | Disposition: A | Discharge status: Home / Self Care | Payer: BLUE CROSS/BLUE SHIELD | Source: Ambulatory Visit | Attending: Gastroenterology | Admitting: Gastroenterology

## 2016-04-02 ENCOUNTER — Ambulatory Visit: Payer: BLUE CROSS/BLUE SHIELD | Admitting: *Deleted

## 2016-04-02 ENCOUNTER — Encounter
Admission: RE | Disposition: A | Discharge status: Home / Self Care | Payer: Self-pay | Source: Ambulatory Visit | Attending: Gastroenterology

## 2016-04-02 DIAGNOSIS — F17200 Nicotine dependence, unspecified, uncomplicated: Secondary | ICD-10-CM | POA: Diagnosis not present

## 2016-04-02 DIAGNOSIS — Z9884 Bariatric surgery status: Secondary | ICD-10-CM | POA: Insufficient documentation

## 2016-04-02 DIAGNOSIS — K621 Rectal polyp: Secondary | ICD-10-CM | POA: Diagnosis not present

## 2016-04-02 DIAGNOSIS — K219 Gastro-esophageal reflux disease without esophagitis: Secondary | ICD-10-CM | POA: Diagnosis not present

## 2016-04-02 DIAGNOSIS — Z79899 Other long term (current) drug therapy: Secondary | ICD-10-CM | POA: Diagnosis not present

## 2016-04-02 DIAGNOSIS — K5730 Diverticulosis of large intestine without perforation or abscess without bleeding: Secondary | ICD-10-CM | POA: Diagnosis not present

## 2016-04-02 DIAGNOSIS — D125 Benign neoplasm of sigmoid colon: Secondary | ICD-10-CM | POA: Diagnosis not present

## 2016-04-02 DIAGNOSIS — D128 Benign neoplasm of rectum: Secondary | ICD-10-CM | POA: Insufficient documentation

## 2016-04-02 DIAGNOSIS — F419 Anxiety disorder, unspecified: Secondary | ICD-10-CM | POA: Insufficient documentation

## 2016-04-02 DIAGNOSIS — Z1211 Encounter for screening for malignant neoplasm of colon: Secondary | ICD-10-CM | POA: Diagnosis not present

## 2016-04-02 DIAGNOSIS — K5909 Other constipation: Secondary | ICD-10-CM | POA: Insufficient documentation

## 2016-04-02 DIAGNOSIS — R1030 Lower abdominal pain, unspecified: Secondary | ICD-10-CM | POA: Insufficient documentation

## 2016-04-02 DIAGNOSIS — K635 Polyp of colon: Secondary | ICD-10-CM | POA: Diagnosis not present

## 2016-04-02 DIAGNOSIS — Z8371 Family history of colonic polyps: Secondary | ICD-10-CM | POA: Diagnosis not present

## 2016-04-02 DIAGNOSIS — K5790 Diverticulosis of intestine, part unspecified, without perforation or abscess without bleeding: Secondary | ICD-10-CM | POA: Diagnosis not present

## 2016-04-02 HISTORY — DX: Gastro-esophageal reflux disease without esophagitis: K21.9

## 2016-04-02 HISTORY — DX: Chronic kidney disease, unspecified: N18.9

## 2016-04-02 HISTORY — PX: COLONOSCOPY WITH PROPOFOL: SHX5780

## 2016-04-02 HISTORY — DX: Anxiety disorder, unspecified: F41.9

## 2016-04-02 SURGERY — COLONOSCOPY WITH PROPOFOL
Anesthesia: General

## 2016-04-02 MED ORDER — SODIUM CHLORIDE 0.9 % IV SOLN
INTRAVENOUS | Status: DC
  Administered 2016-04-02: 13:00:00 via INTRAVENOUS

## 2016-04-02 MED ORDER — EPHEDRINE SULFATE 50 MG/ML IJ SOLN
INTRAMUSCULAR | Status: AC
  Filled 2016-04-02: qty 1

## 2016-04-02 MED ORDER — SODIUM CHLORIDE 0.9 % IV SOLN: INTRAVENOUS | Status: DC

## 2016-04-02 MED ORDER — PROPOFOL 500 MG/50ML IV EMUL
INTRAVENOUS | Status: AC
  Filled 2016-04-02: qty 50

## 2016-04-02 MED ORDER — EPHEDRINE SULFATE 50 MG/ML IJ SOLN
INTRAMUSCULAR | Status: DC | PRN
  Administered 2016-04-02 (×3): 10 mg via INTRAVENOUS

## 2016-04-02 MED ORDER — PROPOFOL 500 MG/50ML IV EMUL
INTRAVENOUS | Status: DC | PRN
  Administered 2016-04-02: 100 ug/kg/min via INTRAVENOUS

## 2016-04-02 NOTE — Anesthesia Preprocedure Evaluation (Signed)
Anesthesia Evaluation  Patient identified by MRN, date of birth, ID band Patient awake    Reviewed: Allergy & Precautions, NPO status , Patient's Chart, lab work & pertinent test results  History of Anesthesia Complications Negative for: history of anesthetic complications  Airway Mallampati: II  TM Distance: >3 FB Neck ROM: Full    Dental no notable dental hx.    Pulmonary neg sleep apnea, neg COPD, Current Smoker,    breath sounds clear to auscultation- rhonchi (-) wheezing      Cardiovascular Exercise Tolerance: Good (-) hypertension(-) CAD and (-) Past MI  Rhythm:Regular Rate:Normal - Systolic murmurs and - Diastolic murmurs    Neuro/Psych negative neurological ROS  negative psych ROS   GI/Hepatic Neg liver ROS, GERD  ,  Endo/Other  negative endocrine ROSneg diabetes  Renal/GU negative Renal ROS     Musculoskeletal negative musculoskeletal ROS (+)   Abdominal (+) - obese,   Peds  Hematology negative hematology ROS (+)   Anesthesia Other Findings Past Medical History: No date: Anxiety No date: Chronic kidney disease     Comment: kidney stones No date: GERD (gastroesophageal reflux disease)   Reproductive/Obstetrics                             Anesthesia Physical Anesthesia Plan  ASA: II  Anesthesia Plan: General   Post-op Pain Management:    Induction: Intravenous  Airway Management Planned: Natural Airway  Additional Equipment:   Intra-op Plan:   Post-operative Plan:   Informed Consent: I have reviewed the patients History and Physical, chart, labs and discussed the procedure including the risks, benefits and alternatives for the proposed anesthesia with the patient or authorized representative who has indicated his/her understanding and acceptance.   Dental advisory given  Plan Discussed with: CRNA and Anesthesiologist  Anesthesia Plan Comments:          Anesthesia Quick Evaluation

## 2016-04-02 NOTE — H&P (Signed)
Outpatient short stay form Pre-procedure 04/02/2016 12:45 PM Lollie Sails MD  Primary Physician: Dr Clayborn Bigness  Reason for visit:  Colonoscopy  History of present illness:  Patient is a 51 year old female presenting today as above. She does have problems with chronic constipation but takes MiraLAX only irregularly. She does have some central lower abdominal discomfort that is decreased after a bowel movement. She did have a colonoscopy in 2011. This was incomplete to the sigmoid he has had a gastric bypass predating that for about 2 years. She had a barium enema at that time that was negative for annular constricting lesions or intraluminal polypoid masses. There were scattered diverticuli. There is no evidence of extrinsic compression.    Current Facility-Administered Medications:  .  0.9 %  sodium chloride infusion, , Intravenous, Continuous, Lollie Sails, MD .  0.9 %  sodium chloride infusion, , Intravenous, Continuous, Lollie Sails, MD .  0.9 %  sodium chloride infusion, , Intravenous, Continuous, Lollie Sails, MD  Prescriptions Prior to Admission  Medication Sig Dispense Refill Last Dose  . clonazePAM (KLONOPIN) 1 MG tablet Take 1 mg by mouth daily as needed for anxiety.   Past Week at Unknown time  . ergocalciferol (VITAMIN D2) 50000 units capsule Take 50,000 Units by mouth once a week.     . gabapentin (NEURONTIN) 600 MG tablet Take 600 mg by mouth 3 (three) times daily.   04/01/2016 at Unknown time  . omeprazole (PRILOSEC) 20 MG capsule Take 20 mg by mouth daily.   04/01/2016 at Unknown time  . oxyCODONE-acetaminophen (PERCOCET/ROXICET) 5-325 MG tablet Take 1 tablet by mouth every 8 (eight) hours as needed for severe pain.   04/01/2016 at Unknown time  . dicyclomine (BENTYL) 10 MG capsule Take 10 mg by mouth 4 (four) times daily -  before meals and at bedtime.   Not Taking at Unknown time  . doxycycline (VIBRAMYCIN) 100 MG capsule Take 100 mg by mouth 2 (two) times  daily.   Not Taking at Unknown time     Allergies  Allergen Reactions  . Sulfamethoxazole-Trimethoprim Other (See Comments)     Past Medical History:  Diagnosis Date  . Anxiety   . Chronic kidney disease    kidney stones  . GERD (gastroesophageal reflux disease)     Review of systems:      Physical Exam    Heart and lungs: Regular rate and rhythm without rub or gallop, lungs are bilaterally clear.    HEENT: Normocephalic atraumatic eyes are anicteric    Other:     Pertinant exam for procedure: Soft nontender nondistended bowel sounds positive normoactive.    Planned proceedures: Colonoscopy and indicated procedures. I have discussed the risks benefits and complications of procedures to include not limited to bleeding, infection, perforation and the risk of sedation and the patient wishes to proceed.    Lollie Sails, MD Gastroenterology 04/02/2016  12:45 PM

## 2016-04-02 NOTE — Anesthesia Post-op Follow-up Note (Cosign Needed)
Anesthesia QCDR form completed.        

## 2016-04-02 NOTE — Op Note (Signed)
[facility] Gastroenterology Patient Name: Tamara Garrett Procedure Date: 04/02/2016 12:39 PM MRN: [id number] Account #: [id number] Date of Birth: 01-Jan-1966 Admit Type: Outpatient Age: 51 Room: [facility] ENDO ROOM 3 Gender: Female Note Status: Finalized Procedure:            Colonoscopy Indications:          Lower abdominal pain, Family history of colonic polyps                        in a first-degree relative Providers:            Lollie Sails, MD Referring MD:         Lavera Guise, MD (Referring MD) Medicines:            Monitored Anesthesia Care Complications:        No immediate complications. Procedure:            Pre-Anesthesia Assessment:                       - ASA Grade Assessment: II - A patient with mild                        systemic disease.                       After obtaining informed consent, the colonoscope was                        passed under direct vision. Throughout the procedure,                        the patient's blood pressure, pulse, and oxygen                        saturations were monitored continuously. The                        Colonoscope was introduced through the anus and                        advanced to the the cecum, identified by appendiceal                        orifice and ileocecal valve. The quality of the bowel                        preparation was good. The patient tolerated the                        procedure well. The colonoscopy was performed with                        moderate difficulty. Findings:      A few medium-mouthed diverticula were found in the sigmoid colon and       descending colon.      A 5 mm polyp was found in the rectum. The polyp was sessile. The polyp       was removed with a cold snare. Resection and retrieval were complete.      A 3 mm polyp was found in the distal sigmoid colon. The polyp  was       sessile. The polyp was removed with a cold snare. Resection and       retrieval were  complete.      Two sessile polyps were found in the distal sigmoid colon. The polyps       were 1 to 2 mm in size. These polyps were removed with a cold biopsy       forceps. Resection and retrieval were complete.      A 2 mm polyp was found in the rectum. The polyp was sessile. The polyp       was removed with a cold biopsy forceps. Resection and retrieval were       complete.      The exam was otherwise normal throughout the examined colon.      The digital rectal exam was normal. Impression:           - Diverticulosis in the sigmoid colon and in the                        descending colon.                       - One 5 mm polyp in the rectum, removed with a cold                        snare. Resected and retrieved.                       - One 3 mm polyp in the distal sigmoid colon, removed                        with a cold snare. Resected and retrieved.                       - Two 1 to 2 mm polyps in the distal sigmoid colon,                        removed with a cold biopsy forceps. Resected and                        retrieved.                       - One 2 mm polyp in the rectum, removed with a cold                        biopsy forceps. Resected and retrieved. Recommendation:       - Discharge patient to home.                       - Await pathology results.                       - Telephone GI clinic for pathology results in 1 week. Procedure Code(s):    --- Professional ---                       [phone], Colonoscopy, flexible; with removal of tumor(s),                        polyp(s), or other  lesion(s) by snare technique                       [id number], 59, Colonoscopy, flexible; with biopsy, single                        or multiple Diagnosis Code(s):    --- Professional ---                       D12.5, Benign neoplasm of sigmoid colon                       K62.1, Rectal polyp                       R10.30, Lower abdominal pain, unspecified                       Z83.71, Family  history of colonic polyps                       K57.30, Diverticulosis of large intestine without                        perforation or abscess without bleeding CPT copyright 2016 [employer]. All rights reserved. The codes documented in this report are preliminary and upon coder review may  be revised to meet current compliance requirements. Lollie Sails, MD 04/02/2016 1:38:08 PM This report has been signed electronically. Number of Addenda: 0 Note Initiated On: 04/02/2016 12:39 PM Scope Withdrawal Time: 0 hours 12 minutes 58 seconds  Total Procedure Duration: 0 hours 29 minutes 3 seconds       [facility]

## 2016-04-02 NOTE — Anesthesia Postprocedure Evaluation (Signed)
Anesthesia Post Note  Patient: Tamara Garrett  Procedure(s) Performed: Procedure(s) (LRB): COLONOSCOPY WITH PROPOFOL (N/A)  Patient location during evaluation: PACU Anesthesia Type: General Level of consciousness: awake and alert and oriented Pain management: pain level controlled Vital Signs Assessment: post-procedure vital signs reviewed and stable Respiratory status: spontaneous breathing Cardiovascular status: blood pressure returned to baseline Anesthetic complications: no     Last Vitals:  Vitals:   04/02/16 1345 04/02/16 1355  BP: 130/71 136/79  Pulse:    Resp:    Temp:      Last Pain:  Vitals:   04/02/16 1335  TempSrc: Tympanic  PainSc:                  [name]

## 2016-04-02 NOTE — H&P (Signed)
Outpatient short stay form Pre-procedure 04/02/2016 12:19 PM Lollie Sails MD  Primary Physician:   Reason for visit:    History of present illness:      Current Facility-Administered Medications:  .  0.9 %  sodium chloride infusion, , Intravenous, Continuous, Lollie Sails, MD  Prescriptions Prior to Admission  Medication Sig Dispense Refill Last Dose  . clonazePAM (KLONOPIN) 1 MG tablet Take 1 mg by mouth daily as needed for anxiety.   Past Week at Unknown time  . ergocalciferol (VITAMIN D2) 50000 units capsule Take 50,000 Units by mouth once a week.     . gabapentin (NEURONTIN) 600 MG tablet Take 600 mg by mouth 3 (three) times daily.   04/01/2016 at Unknown time  . omeprazole (PRILOSEC) 20 MG capsule Take 20 mg by mouth daily.   04/01/2016 at Unknown time  . oxyCODONE-acetaminophen (PERCOCET/ROXICET) 5-325 MG tablet Take 1 tablet by mouth every 8 (eight) hours as needed for severe pain.   04/01/2016 at Unknown time  . dicyclomine (BENTYL) 10 MG capsule Take 10 mg by mouth 4 (four) times daily -  before meals and at bedtime.   Not Taking at Unknown time  . doxycycline (VIBRAMYCIN) 100 MG capsule Take 100 mg by mouth 2 (two) times daily.   Not Taking at Unknown time     Allergies  Allergen Reactions  . Sulfamethoxazole-Trimethoprim Other (See Comments)     Past Medical History:  Diagnosis Date  . Anxiety   . Chronic kidney disease    kidney stones  . GERD (gastroesophageal reflux disease)     Review of systems:      Physical Exam    Heart and lungs:     HEENT:     Other:     Pertinant exam for procedure:     Planned proceedures:     Lollie Sails, MD Gastroenterology 04/02/2016  12:19 PM

## 2016-04-02 NOTE — Transfer of Care (Signed)
Immediate Anesthesia Transfer of Care Note  Patient: Iyania Denne  Procedure(s) Performed: Procedure(s): COLONOSCOPY WITH PROPOFOL (N/A)  Patient Location: PACU  Anesthesia Type:General  Level of Consciousness: awake, alert  and oriented  Airway & Oxygen Therapy: Patient Spontanous Breathing and Patient connected to nasal cannula oxygen  Post-op Assessment: Report given to RN and Post -op Vital signs reviewed and stable  Post vital signs: Reviewed and stable  Last Vitals:  Vitals:   04/02/16 1223  BP: 132/78  Pulse: 66  Resp: 18  Temp: 36.3 C    Last Pain:  Vitals:   04/02/16 1223  TempSrc: Tympanic  PainSc: 4       Patients Stated Pain Goal: 4 (07/57/32 2567)  Complications: No apparent anesthesia complications

## 2016-04-03 LAB — SURGICAL PATHOLOGY

## 2016-04-24 DIAGNOSIS — L0100 Impetigo, unspecified: Secondary | ICD-10-CM | POA: Diagnosis not present

## 2016-05-07 DIAGNOSIS — K219 Gastro-esophageal reflux disease without esophagitis: Secondary | ICD-10-CM | POA: Diagnosis not present

## 2016-05-07 DIAGNOSIS — F17210 Nicotine dependence, cigarettes, uncomplicated: Secondary | ICD-10-CM | POA: Diagnosis not present

## 2016-05-07 DIAGNOSIS — F419 Anxiety disorder, unspecified: Secondary | ICD-10-CM | POA: Diagnosis not present

## 2016-05-07 DIAGNOSIS — N390 Urinary tract infection, site not specified: Secondary | ICD-10-CM | POA: Diagnosis not present

## 2016-05-07 DIAGNOSIS — Z0001 Encounter for general adult medical examination with abnormal findings: Secondary | ICD-10-CM | POA: Diagnosis not present

## 2016-05-08 ENCOUNTER — Other Ambulatory Visit: Payer: Self-pay | Admitting: Nurse Practitioner

## 2016-05-08 DIAGNOSIS — Z1231 Encounter for screening mammogram for malignant neoplasm of breast: Secondary | ICD-10-CM

## 2016-05-30 ENCOUNTER — Ambulatory Visit: Payer: BLUE CROSS/BLUE SHIELD

## 2016-06-21 DIAGNOSIS — N390 Urinary tract infection, site not specified: Secondary | ICD-10-CM | POA: Diagnosis not present

## 2016-06-21 DIAGNOSIS — F411 Generalized anxiety disorder: Secondary | ICD-10-CM | POA: Diagnosis not present

## 2016-06-21 DIAGNOSIS — R319 Hematuria, unspecified: Secondary | ICD-10-CM | POA: Diagnosis not present

## 2016-06-21 DIAGNOSIS — F17210 Nicotine dependence, cigarettes, uncomplicated: Secondary | ICD-10-CM | POA: Diagnosis not present

## 2016-06-24 ENCOUNTER — Ambulatory Visit
Admission: RE | Admit: 2016-06-24 | Discharge: 2016-06-24 | Disposition: A | Discharge status: Home / Self Care | Payer: BLUE CROSS/BLUE SHIELD | Source: Ambulatory Visit | Attending: Nurse Practitioner | Admitting: Nurse Practitioner

## 2016-06-24 DIAGNOSIS — N200 Calculus of kidney: Secondary | ICD-10-CM | POA: Diagnosis not present

## 2016-06-24 DIAGNOSIS — Z1231 Encounter for screening mammogram for malignant neoplasm of breast: Secondary | ICD-10-CM

## 2016-06-24 DIAGNOSIS — R319 Hematuria, unspecified: Secondary | ICD-10-CM | POA: Diagnosis not present

## 2016-08-05 DIAGNOSIS — F17210 Nicotine dependence, cigarettes, uncomplicated: Secondary | ICD-10-CM | POA: Diagnosis not present

## 2016-08-05 DIAGNOSIS — F411 Generalized anxiety disorder: Secondary | ICD-10-CM | POA: Diagnosis not present

## 2016-08-05 DIAGNOSIS — N390 Urinary tract infection, site not specified: Secondary | ICD-10-CM | POA: Diagnosis not present

## 2016-08-05 DIAGNOSIS — R319 Hematuria, unspecified: Secondary | ICD-10-CM | POA: Diagnosis not present

## 2016-08-19 DIAGNOSIS — F17210 Nicotine dependence, cigarettes, uncomplicated: Secondary | ICD-10-CM | POA: Diagnosis not present

## 2016-08-19 DIAGNOSIS — N390 Urinary tract infection, site not specified: Secondary | ICD-10-CM | POA: Diagnosis not present

## 2016-08-19 DIAGNOSIS — F411 Generalized anxiety disorder: Secondary | ICD-10-CM | POA: Diagnosis not present

## 2016-08-19 DIAGNOSIS — F331 Major depressive disorder, recurrent, moderate: Secondary | ICD-10-CM | POA: Diagnosis not present

## 2016-09-19 DIAGNOSIS — F900 Attention-deficit hyperactivity disorder, predominantly inattentive type: Secondary | ICD-10-CM | POA: Diagnosis not present

## 2016-09-19 DIAGNOSIS — N390 Urinary tract infection, site not specified: Secondary | ICD-10-CM | POA: Diagnosis not present

## 2016-09-19 DIAGNOSIS — F331 Major depressive disorder, recurrent, moderate: Secondary | ICD-10-CM | POA: Diagnosis not present

## 2016-09-19 DIAGNOSIS — F17210 Nicotine dependence, cigarettes, uncomplicated: Secondary | ICD-10-CM | POA: Diagnosis not present

## 2017-02-25 ENCOUNTER — Encounter: Payer: Self-pay | Admitting: Nurse Practitioner

## 2017-02-25 ENCOUNTER — Ambulatory Visit: Payer: Self-pay | Admitting: Nurse Practitioner

## 2017-02-25 VITALS — BP 130/80 | HR 95 | Resp 16 | Ht 64.0 in | Wt 171.0 lb

## 2017-02-25 DIAGNOSIS — J208 Acute bronchitis due to other specified organisms: Secondary | ICD-10-CM

## 2017-02-25 DIAGNOSIS — B9689 Other specified bacterial agents as the cause of diseases classified elsewhere: Secondary | ICD-10-CM

## 2017-02-25 DIAGNOSIS — R05 Cough: Secondary | ICD-10-CM

## 2017-02-25 DIAGNOSIS — F419 Anxiety disorder, unspecified: Secondary | ICD-10-CM

## 2017-02-25 DIAGNOSIS — R300 Dysuria: Secondary | ICD-10-CM

## 2017-02-25 DIAGNOSIS — R41840 Attention and concentration deficit: Secondary | ICD-10-CM

## 2017-02-25 DIAGNOSIS — R059 Cough, unspecified: Secondary | ICD-10-CM

## 2017-02-25 LAB — POCT URINALYSIS DIPSTICK
Bilirubin, UA: NEGATIVE
Glucose, UA: NEGATIVE
Ketones, UA: NEGATIVE
Nitrite, UA: NEGATIVE
Spec Grav, UA: 1.01 (ref 1.010–1.025)
Urobilinogen, UA: 0.2 E.U./dL
pH, UA: 5 (ref 5.0–8.0)

## 2017-02-25 MED ORDER — LEVOFLOXACIN 500 MG PO TABS: 500.0000 mg | ORAL_TABLET | Freq: Every day | ORAL | 0 refills | Status: DC

## 2017-02-25 MED ORDER — PROMETHAZINE-CODEINE 6.25-10 MG/5ML PO SYRP: 5.0000 mL | ORAL_SOLUTION | Freq: Three times a day (TID) | ORAL | 0 refills | Status: DC | PRN

## 2017-02-25 NOTE — Progress Notes (Addendum)
[facility] [place], [place] [address]  Internal MEDICINE  Office Visit Note  Patient Name: Tamara Garrett  [id number]  [id number]  Date of Service: 02/25/2017  Chief Complaint  Patient presents with  . Sinusitis  . Urinary Tract Infection    Sinusitis  This is a new problem. The current episode started in the past 7 days. The problem is unchanged. The maximum temperature recorded prior to her arrival was 102 - 102.9 F. The fever has been present for 1 to 2 days. The pain is moderate. Associated symptoms include congestion, coughing, headaches and sinus pressure. (Cough is congested and more severe at night. Keeping her awake at night. ) Past treatments include acetaminophen. The treatment provided moderate relief.  Urinary Tract Infection   This is a recurrent problem. The current episode started in the past 7 days. The problem occurs intermittently.    Pt is here for routine follow up.    Current Medication: Outpatient Encounter Medications as of 02/25/2017  Medication Sig  . amphetamine-dextroamphetamine (ADDERALL) 10 MG tablet TAKE 1 TABLET BY MOUTH TWICE A DAY AS NEEDED FOR FOCUS  . clonazePAM (KLONOPIN) 1 MG tablet Take 1 mg by mouth daily as needed for anxiety.  Marland Kitchen estradiol (ESTRACE) 0.1 MG/GM vaginal cream Use pea sized application to the urethra each night at bedtime each night.  Marland Kitchen FLUoxetine (PROZAC) 20 MG capsule Take by mouth daily.  Marland Kitchen gabapentin (NEURONTIN) 600 MG tablet Take 600 mg by mouth 3 (three) times daily.  Marland Kitchen omeprazole (PRILOSEC) 20 MG capsule Take 20 mg by mouth daily.  Marland Kitchen oxyCODONE-acetaminophen (PERCOCET/ROXICET) 5-325 MG tablet Take 1 tablet by mouth every 8 (eight) hours as needed for severe pain.  . [DISCONTINUED] dicyclomine (BENTYL) 10 MG capsule Take 10 mg by mouth 4 (four) times daily -  before meals and at bedtime.  Marland Kitchen levofloxacin (LEVAQUIN) 500 MG tablet Take 1 tablet (500 mg total) by mouth daily.  . promethazine-codeine  (PHENERGAN WITH CODEINE) 6.25-10 MG/5ML syrup Take 5 mLs by mouth every 8 (eight) hours as needed for cough.  . [DISCONTINUED] doxycycline (VIBRAMYCIN) 100 MG capsule Take 100 mg by mouth 2 (two) times daily.  . [DISCONTINUED] ergocalciferol (VITAMIN D2) 50000 units capsule Take 50,000 Units by mouth once a week.   No facility-administered encounter medications on file as of 02/25/2017.     Surgical History: Past Surgical History:  Procedure Laterality Date  . ABDOMINAL HYSTERECTOMY    . BREAST BIOPSY Left 2005   benign  . COLONOSCOPY    . COLONOSCOPY WITH PROPOFOL N/A 04/02/2016   Procedure: COLONOSCOPY WITH PROPOFOL;  Surgeon: Lollie Sails, MD;  Location: [facility] ENDOSCOPY;  Service: Endoscopy;  Laterality: N/A;  . KIDNEY STONE SURGERY Right   . LAPAROSCOPIC GASTRIC BYPASS      Medical History: Past Medical History:  Diagnosis Date  . Anxiety   . Chronic kidney disease    kidney stones  . GERD (gastroesophageal reflux disease)     Family History: Family History  Problem Relation Age of Onset  . Colon polyps Mother   . COPD Mother   . Congestive Heart Failure Mother   . Diabetes Mother   . Dementia Father   . Stroke Father     Social History   Socioeconomic History  . Marital status: Married    Spouse name: Not on file  . Number of children: Not on file  . Years of education: Not on file  . Highest education level:  Not on file  Social Needs  . Financial resource strain: Not on file  . Food insecurity - worry: Not on file  . Food insecurity - inability: Not on file  . Transportation needs - medical: Not on file  . Transportation needs - non-medical: Not on file  Occupational History  . Not on file  Tobacco Use  . Smoking status: Current Every Day Smoker  . Smokeless tobacco: Never Used  Substance and Sexual Activity  . Alcohol use: No  . Drug use: No  . Sexual activity: Not on file  Other Topics Concern  . Not on file  Social History Narrative  . Not  on file      Review of Systems  HENT: Positive for congestion and sinus pressure.   Respiratory: Positive for cough.   Neurological: Positive for headaches.  Psychiatric/Behavioral: Positive for decreased concentration. The patient is nervous/anxious.        The patient started new job at the end of November. She had been started on low dose adderall - 10mg . She usually takes this daily when needed, but will sometimes take a second dose. Her new job is part time, however, expected to increase to full-time hours over the next few months. This medication helps to keep her on track and focused on her work. She needs to have new prescriptions for this today.    Today's Vitals   02/25/17 1204  BP: 130/80  Pulse: 95  Resp: 16  SpO2: 98%  Weight: 171 lb (77.6 kg)  Height: 5\' 4"  (1.626 m)    Physical Exam  Constitutional: She is oriented to person, place, and time. She appears well-developed and well-nourished.  HENT:  Head: Normocephalic and atraumatic.  Right Ear: Tympanic membrane is erythematous and bulging.  Left Ear: Tympanic membrane is erythematous and bulging.  Nose: Rhinorrhea present. Right sinus exhibits maxillary sinus tenderness and frontal sinus tenderness. Left sinus exhibits maxillary sinus tenderness and frontal sinus tenderness.  Mouth/Throat: Posterior oropharyngeal edema and posterior oropharyngeal erythema present.  Eyes: EOM are normal. Pupils are equal, round, and reactive to light.  Neck: Normal range of motion. Neck supple.  Cardiovascular: Normal rate and regular rhythm.  Pulmonary/Chest: Effort normal and breath sounds normal. She has no wheezes.  Congested, non-productive cough present.   Abdominal: Soft. Bowel sounds are normal. There is no tenderness.  Genitourinary:  Genitourinary Comments: Urine sample showing small WBC and large amount of blood.   Musculoskeletal: Normal range of motion.  Lymphadenopathy:    She has cervical adenopathy.   Neurological: She is alert and oriented to person, place, and time.  Skin: Skin is warm and dry.  Psychiatric: She has a normal mood and affect. Her behavior is normal. Judgment and thought content normal.   Assessment/Plan:  1. Acute bronchitis, bacterial - levofloxacin (LEVAQUIN) 500 MG tablet; Take 1 tablet (500 mg total) by mouth daily.  Dispense: 10 tablet; Refill: 0 mucinex OTC to relieve congestion. Recommend use of tylenol as needed to relieve headache and fever.   2. Cough - promethazine-codeine (PHENERGAN WITH CODEINE) 6.25-10 MG/5ML syrup; Take 5 mLs by mouth every 8 (eight) hours as needed for cough.  Dispense: 120 mL; Refill: 0 Advised patient to limit this to night time dosing, as it may cause dizziness or drowsiness.   3. Dysuria - POCT Urinalysis Dipstick - indicates small WBC and large blood. Treat for infection with levofloxacin 500mg  daily.  4. Attention and concentration deficit Renew rx for adderall 10mg  BID when  needed. Three 30 day prescriptions provided. Dates are 02/25/2017, 03/25/2017, and 04/23/2017  5. Anxiety Written rx for clonazepam 1mg  daily as needed given to patient today.   General Counseling: Tamara Garrett verbalizes understanding of the findings of todays visit and agrees with plan of treatment. I have discussed any further diagnostic evaluation that may be needed or ordered today. We also reviewed her medications today. she has been encouraged to call the office with any questions or concerns that should arise related to todays visit.  This patient was seen by Leretha Pol, FNP- C in Collaboration with Dr Lavera Guise as a part of collaborative care agreement    Orders Placed This Encounter  Procedures  . POCT Urinalysis Dipstick    Meds ordered this encounter  Medications  . levofloxacin (LEVAQUIN) 500 MG tablet    Sig: Take 1 tablet (500 mg total) by mouth daily.    Dispense:  10 tablet    Refill:  0    Order Specific Question:   Supervising  Provider    Answer:   Lavera Guise [[id number]]  . promethazine-codeine (PHENERGAN WITH CODEINE) 6.25-10 MG/5ML syrup    Sig: Take 5 mLs by mouth every 8 (eight) hours as needed for cough.    Dispense:  120 mL    Refill:  0    Order Specific Question:   Supervising Provider    Answer:   Lavera Guise [[id number]]    Time spent 32 Minutes    Dr Lavera Guise Internal medicine

## 2017-04-21 ENCOUNTER — Other Ambulatory Visit: Payer: Self-pay

## 2017-04-21 MED ORDER — FLUOXETINE HCL 20 MG PO CAPS: ORAL_CAPSULE | ORAL | 0 refills | Status: DC

## 2017-05-22 ENCOUNTER — Encounter: Payer: Self-pay | Admitting: Nurse Practitioner

## 2017-05-22 ENCOUNTER — Ambulatory Visit (INDEPENDENT_AMBULATORY_CARE_PROVIDER_SITE_OTHER): Payer: Self-pay | Admitting: Nurse Practitioner

## 2017-05-22 VITALS — BP 110/78 | HR 76 | Resp 16 | Ht 64.0 in | Wt 179.0 lb

## 2017-05-22 DIAGNOSIS — J3481 Nasal mucositis (ulcerative): Secondary | ICD-10-CM

## 2017-05-22 DIAGNOSIS — R238 Other skin changes: Secondary | ICD-10-CM

## 2017-05-22 DIAGNOSIS — R233 Spontaneous ecchymoses: Secondary | ICD-10-CM

## 2017-05-22 MED ORDER — MUPIROCIN 2 % EX OINT: 1.0000 "application " | TOPICAL_OINTMENT | Freq: Two times a day (BID) | CUTANEOUS | 1 refills | Status: DC

## 2017-05-22 NOTE — Progress Notes (Signed)
[facility] [place], [place] [address]  Internal MEDICINE  Office Visit Note  Patient Name: Tamara Garrett  [id number]  [id number]  Date of Service: 06/11/2017    Pt is here for a sick visit.  Chief Complaint  Patient presents with  . bruise    bruise on the middle of the back that has been there for two months not going away  . Nasal Congestion    scab in right nostril     Patient noted bruise on upper part of the left side of her back about 2 months ago. Gets lighter and darker. Does not remember backing into anything. Does not remember falling or any injury of any sort. She has not bruises at any other place on her body. Does have scab inner part of the right nostril. Will pick it off. Nostril will bleed. Hurts up into the right sinus area and even into her teeth.        Current Medication:  Outpatient Encounter Medications as of 05/22/2017  Medication Sig  . amphetamine-dextroamphetamine (ADDERALL) 10 MG tablet TAKE 1 TABLET BY MOUTH TWICE A DAY AS NEEDED FOR FOCUS  . clonazePAM (KLONOPIN) 1 MG tablet Take 1 mg by mouth daily as needed for anxiety.  Marland Kitchen FLUoxetine (PROZAC) 20 MG capsule Take 1 tab po daily  . gabapentin (NEURONTIN) 600 MG tablet Take 600 mg by mouth 3 (three) times daily.  Marland Kitchen oxyCODONE-acetaminophen (PERCOCET/ROXICET) 5-325 MG tablet Take 1 tablet by mouth every 8 (eight) hours as needed for severe pain.  . [DISCONTINUED] omeprazole (PRILOSEC) 20 MG capsule Take 20 mg by mouth daily.  Marland Kitchen estradiol (ESTRACE) 0.1 MG/GM vaginal cream Use pea sized application to the urethra each night at bedtime each night.  . mupirocin ointment (BACTROBAN) 2 % Place 1 application into the nose 2 (two) times daily.  . [DISCONTINUED] levofloxacin (LEVAQUIN) 500 MG tablet Take 1 tablet (500 mg total) by mouth daily.  . [DISCONTINUED] promethazine-codeine (PHENERGAN WITH CODEINE) 6.25-10 MG/5ML syrup Take 5 mLs by mouth every 8 (eight) hours as needed  for cough.   No facility-administered encounter medications on file as of 05/22/2017.       Medical History: Past Medical History:  Diagnosis Date  . Anxiety   . Chronic kidney disease    kidney stones  . GERD (gastroesophageal reflux disease)      Today's Vitals   05/22/17 1054  BP: 110/78  Pulse: 76  Resp: 16  SpO2: 97%  Weight: 179 lb (81.2 kg)  Height: 5\' 4"  (1.626 m)    Review of Systems  Constitutional: Negative for activity change, chills, fatigue and unexpected weight change.  HENT: Positive for rhinorrhea. Negative for congestion, sneezing and sore throat.        Scab in right nostril   Eyes: Negative.  Negative for redness.  Respiratory: Negative for cough, chest tightness and shortness of breath.   Cardiovascular: Negative for chest pain and palpitations.  Gastrointestinal: Negative for abdominal pain, constipation, diarrhea, nausea and vomiting.  Genitourinary: Negative for dysuria and frequency.  Musculoskeletal: Negative for arthralgias, back pain, joint swelling and neck pain.  Skin: Negative for rash.       Bruising on left upper back, adjacent to the spine.   Neurological: Negative.  Negative for tremors and numbness.  Hematological: Negative for adenopathy. Does not bruise/bleed easily.  Psychiatric/Behavioral: Negative for behavioral problems (Depression), sleep disturbance and suicidal ideas. The patient is not nervous/anxious.     Physical  Exam  Constitutional: She is oriented to person, place, and time. She appears well-developed and well-nourished. No distress.  HENT:  Head: Normocephalic and atraumatic.  Mouth/Throat: Oropharynx is clear and moist. No oropharyngeal exudate.  Eyes: Pupils are equal, round, and reactive to light. EOM are normal.  Neck: Normal range of motion. Neck supple. No JVD present. No tracheal deviation present. No thyromegaly present.  Cardiovascular: Normal rate, regular rhythm and normal heart sounds. Exam reveals no  gallop and no friction rub.  No murmur heard. Pulmonary/Chest: Effort normal and breath sounds normal. No respiratory distress. She has no wheezes. She has no rales. She exhibits no tenderness.  Abdominal: Soft. Bowel sounds are normal. There is no tenderness.  Musculoskeletal: Normal range of motion.  Lymphadenopathy:    She has no cervical adenopathy.  Neurological: She is alert and oriented to person, place, and time. No cranial nerve deficit.  Skin: Skin is warm and dry. She is not diaphoretic.     Psychiatric: She has a normal mood and affect. Her behavior is normal. Judgment and thought content normal.  Nursing note and vitals reviewed.  Assessment/Plan: 1. Abnormal bruising Check cbc with platelet count for further evaluation.  - CBC, No Differential/Platelet  2. Nasal mucositis (ulcerative) - mupirocin ointment (BACTROBAN) 2 %; Place 1 application into the nose 2 (two) times daily.  Dispense: 22 g; Refill: 1  General Counseling: Michell verbalizes understanding of the findings of todays visit and agrees with plan of treatment. I have discussed any further diagnostic evaluation that may be needed or ordered today. We also reviewed her medications today. she has been encouraged to call the office with any questions or concerns that should arise related to todays visit.  This patient was seen by Leretha Pol, FNP- C in Collaboration with Dr Lavera Guise as a part of collaborative care agreement    Orders Placed This Encounter  Procedures  . CBC, No Differential/Platelet  . Platelet count  . Specimen status report    Meds ordered this encounter  Medications  . mupirocin ointment (BACTROBAN) 2 %    Sig: Place 1 application into the nose 2 (two) times daily.    Dispense:  22 g    Refill:  1    Order Specific Question:   Supervising Provider    Answer:   Lavera Guise [[id number]]    Time spent: 15 Minutes

## 2017-05-23 ENCOUNTER — Telehealth: Payer: Self-pay

## 2017-05-23 LAB — CBC, NO DIFFERENTIAL/PLATELET
Hematocrit: 38.8 % (ref 34.0–46.6)
Hemoglobin: 12.7 g/dL (ref 11.1–15.9)
MCH: 30 pg (ref 26.6–33.0)
MCHC: 32.7 g/dL (ref 31.5–35.7)
MCV: 92 fL (ref 79–97)
RBC: 4.23 x10E6/uL (ref 3.77–5.28)
RDW: 13.8 % (ref 12.3–15.4)
WBC: 8.4 10*3/uL (ref 3.4–10.8)

## 2017-05-23 NOTE — Progress Notes (Signed)
Please let the patient know that her lab work was perfect. thanks

## 2017-05-23 NOTE — Progress Notes (Signed)
Hey. Can you have them add platelet count onto this ASAP. Thanks

## 2017-05-23 NOTE — Telephone Encounter (Signed)
Called [facility] and add on platelel count

## 2017-05-26 ENCOUNTER — Telehealth: Payer: Self-pay

## 2017-05-26 NOTE — Telephone Encounter (Signed)
Pt advised that labs were normal.  dbs

## 2017-05-27 ENCOUNTER — Other Ambulatory Visit: Payer: Self-pay | Admitting: Internal Medicine

## 2017-06-07 LAB — PLATELET COUNT: Platelets: 280 10*3/uL (ref 150–379)

## 2017-06-07 LAB — SPECIMEN STATUS REPORT

## 2017-06-09 ENCOUNTER — Telehealth: Payer: Self-pay | Admitting: Nurse Practitioner

## 2017-06-09 NOTE — Telephone Encounter (Signed)
Attempted to call patient , pt voicemail was full unable to leave a message.

## 2017-06-09 NOTE — Telephone Encounter (Signed)
-----   Message from Ronnell Freshwater, NP sent at 06/09/2017  8:34 AM EDT ----- Please let the patient know that blood count and platelets were normal. thanks

## 2017-06-11 DIAGNOSIS — J3481 Nasal mucositis (ulcerative): Secondary | ICD-10-CM | POA: Insufficient documentation

## 2017-06-11 DIAGNOSIS — R233 Spontaneous ecchymoses: Secondary | ICD-10-CM | POA: Insufficient documentation

## 2017-06-11 DIAGNOSIS — R238 Other skin changes: Secondary | ICD-10-CM | POA: Insufficient documentation

## 2017-06-26 ENCOUNTER — Ambulatory Visit: Payer: Self-pay | Admitting: Nurse Practitioner

## 2017-07-25 ENCOUNTER — Other Ambulatory Visit: Payer: Self-pay

## 2017-07-25 MED ORDER — FLUOXETINE HCL 20 MG PO CAPS: ORAL_CAPSULE | ORAL | 0 refills | Status: DC

## 2017-09-03 ENCOUNTER — Encounter: Payer: Self-pay | Admitting: Nurse Practitioner

## 2017-09-03 ENCOUNTER — Ambulatory Visit (INDEPENDENT_AMBULATORY_CARE_PROVIDER_SITE_OTHER): Payer: Self-pay | Admitting: Nurse Practitioner

## 2017-09-03 VITALS — BP 122/74 | HR 75 | Resp 16 | Ht 64.0 in | Wt 183.8 lb

## 2017-09-03 DIAGNOSIS — R41840 Attention and concentration deficit: Secondary | ICD-10-CM | POA: Insufficient documentation

## 2017-09-03 DIAGNOSIS — R109 Unspecified abdominal pain: Secondary | ICD-10-CM

## 2017-09-03 DIAGNOSIS — L209 Atopic dermatitis, unspecified: Secondary | ICD-10-CM

## 2017-09-03 DIAGNOSIS — G8929 Other chronic pain: Secondary | ICD-10-CM | POA: Insufficient documentation

## 2017-09-03 DIAGNOSIS — F419 Anxiety disorder, unspecified: Secondary | ICD-10-CM

## 2017-09-03 MED ORDER — TRIAMCINOLONE ACETONIDE 0.025 % EX CREA: 1.0000 "application " | TOPICAL_CREAM | Freq: Two times a day (BID) | CUTANEOUS | 2 refills | Status: DC

## 2017-09-03 MED ORDER — AMPHETAMINE-DEXTROAMPHETAMINE 10 MG PO TABS: 10.0000 mg | ORAL_TABLET | Freq: Two times a day (BID) | ORAL | 0 refills | Status: DC

## 2017-09-03 MED ORDER — CLONAZEPAM 1 MG PO TABS: 1.0000 mg | ORAL_TABLET | Freq: Every day | ORAL | 2 refills | Status: DC | PRN

## 2017-09-03 NOTE — Progress Notes (Signed)
[facility] [place], [place] [address]  Internal MEDICINE  Office Visit Note  Patient Name: Tamara Garrett  [id number]  [id number]  Date of Service: 09/03/2017  Chief Complaint  Patient presents with  . Gastroesophageal Reflux    general follow up  . Anxiety  . Chronic Kidney Disease  . Medical Management of Chronic Issues    medication refills    Patient noted bruise on upper part of the left side of her back about 4 months ago. Gets lighter and darker. Does not remember backing into anything. Does not remember falling or any injury of any sort. She has not bruises at any other place on her body. Has not changed since her last visit. Does have tenderness associated with light/medium palpation.         Current Medication: Outpatient Encounter Medications as of 09/03/2017  Medication Sig  . amphetamine-dextroamphetamine (ADDERALL) 10 MG tablet Take 1 tablet (10 mg total) by mouth 2 (two) times daily.  . clonazePAM (KLONOPIN) 1 MG tablet Take 1 tablet (1 mg total) by mouth daily as needed for anxiety.  Marland Kitchen estradiol (ESTRACE) 0.1 MG/GM vaginal cream Use pea sized application to the urethra each night at bedtime each night.  Marland Kitchen FLUoxetine (PROZAC) 20 MG capsule Take 1 tab po daily  . gabapentin (NEURONTIN) 600 MG tablet Take 600 mg by mouth 3 (three) times daily.  . mupirocin ointment (BACTROBAN) 2 % Place 1 application into the nose 2 (two) times daily.  Marland Kitchen omeprazole (PRILOSEC) 20 MG capsule TAKE 1 CAPSULE BY MOUTH EVERY DAY  . oxyCODONE-acetaminophen (PERCOCET/ROXICET) 5-325 MG tablet Take 1 tablet by mouth every 8 (eight) hours as needed for severe pain.  . [DISCONTINUED] amphetamine-dextroamphetamine (ADDERALL) 10 MG tablet TAKE 1 TABLET BY MOUTH TWICE A DAY AS NEEDED FOR FOCUS  . [DISCONTINUED] amphetamine-dextroamphetamine (ADDERALL) 10 MG tablet Take 1 tablet (10 mg total) by mouth 2 (two) times daily.  . [DISCONTINUED]  amphetamine-dextroamphetamine (ADDERALL) 10 MG tablet Take 1 tablet (10 mg total) by mouth 2 (two) times daily.  . [DISCONTINUED] clonazePAM (KLONOPIN) 1 MG tablet Take 1 mg by mouth daily as needed for anxiety.  . triamcinolone (KENALOG) 0.025 % cream Apply 1 application topically 2 (two) times daily.   No facility-administered encounter medications on file as of 09/03/2017.     Surgical History: Past Surgical History:  Procedure Laterality Date  . ABDOMINAL HYSTERECTOMY    . BREAST BIOPSY Left 2005   benign  . COLONOSCOPY    . COLONOSCOPY WITH PROPOFOL N/A 04/02/2016   Procedure: COLONOSCOPY WITH PROPOFOL;  Surgeon: Lollie Sails, MD;  Location[facility] ENDOSCOPY;  Service: Endoscopy;  Laterality: N/A;  . KIDNEY STONE SURGERY Right   . LAPAROSCOPIC GASTRIC BYPASS      Medical History: Past Medical History:  Diagnosis Date  . Anxiety   . Chronic kidney disease    kidney stones  . GERD (gastroesophageal reflux disease)     Family History: Family History  Problem Relation Age of Onset  . Colon polyps Mother   . COPD Mother   . Congestive Heart Failure Mother   . Diabetes Mother   . Dementia Father   . Stroke Father     Social History   Socioeconomic History  . Marital status: Married    Spouse name: Not on file  . Number of children: Not on file  . Years of education: Not on file  . Highest education level: Not on file  Occupational  History  . Not on file  Social Needs  . Financial resource strain: Not on file  . Food insecurity:    Worry: Not on file    Inability: Not on file  . Transportation needs:    Medical: Not on file    Non-medical: Not on file  Tobacco Use  . Smoking status: Current Every Day Smoker  . Smokeless tobacco: Never Used  Substance and Sexual Activity  . Alcohol use: No  . Drug use: No  . Sexual activity: Not on file  Lifestyle  . Physical activity:    Days per week: Not on file    Minutes per session: Not on file  . Stress: Not  on file  Relationships  . Social connections:    Talks on phone: Not on file    Gets together: Not on file    Attends religious service: Not on file    Active member of club or organization: Not on file    Attends meetings of clubs or organizations: Not on file    Relationship status: Not on file  . Intimate partner violence:    Fear of current or ex partner: Not on file    Emotionally abused: Not on file    Physically abused: Not on file    Forced sexual activity: Not on file  Other Topics Concern  . Not on file  Social History Narrative  . Not on file    Review of Systems  Constitutional: Negative for activity change, chills, fatigue and unexpected weight change.  HENT: Negative for congestion, rhinorrhea, sneezing and sore throat.   Eyes: Negative.  Negative for redness.  Respiratory: Negative for cough, chest tightness, shortness of breath and wheezing.   Cardiovascular: Negative for chest pain and palpitations.  Gastrointestinal: Negative for abdominal pain, constipation, diarrhea, nausea and vomiting.  Endocrine: Negative for cold intolerance, heat intolerance, polydipsia and polyphagia.  Genitourinary: Negative.  Negative for dysuria and frequency.  Musculoskeletal: Positive for back pain. Negative for arthralgias, joint swelling and neck pain.       Chronic. Patient does see pain management.   Skin: Negative for rash.       Bruising on left upper back, adjacent to the spine.   Allergic/Immunologic: Negative for environmental allergies.  Neurological: Negative for dizziness, tremors, numbness and headaches.  Hematological: Negative for adenopathy. Does not bruise/bleed easily.  Psychiatric/Behavioral: Positive for decreased concentration. Negative for behavioral problems (Depression), sleep disturbance and suicidal ideas. The patient is not nervous/anxious.        Has started a new job at [employer] near [place]. Enjoys this job much more than prion job. Continues to use  adderall to help her stay focused and on track. Will also take clonazepam at night if needed for anxiety which keeps her awake.       Today's Vitals   09/03/17 1004  BP: 122/74  Pulse: 75  Resp: 16  SpO2: 98%  Weight: 183 lb 12.8 oz (83.4 kg)  Height: 5\' 4"  (1.626 m)   Physical Exam  Constitutional: She is oriented to person, place, and time. She appears well-developed and well-nourished. No distress.  HENT:  Head: Normocephalic and atraumatic.  Nose: Nose normal.  Mouth/Throat: Oropharynx is clear and moist. No oropharyngeal exudate.  Eyes: Pupils are equal, round, and reactive to light. Conjunctivae and EOM are normal.  Neck: Normal range of motion. Neck supple. No JVD present. No tracheal deviation present. No thyromegaly present.  Cardiovascular: Normal rate, regular rhythm and  normal heart sounds. Exam reveals no gallop and no friction rub.  No murmur heard. Pulmonary/Chest: Effort normal and breath sounds normal. No respiratory distress. She has no wheezes. She has no rales. She exhibits no tenderness.  Abdominal: Soft. Bowel sounds are normal. There is no tenderness.  Musculoskeletal: Normal range of motion.  Lymphadenopathy:    She has no cervical adenopathy.  Neurological: She is alert and oriented to person, place, and time. No cranial nerve deficit.  Skin: Skin is warm and dry. Capillary refill takes less than 2 seconds. She is not diaphoretic.     Psychiatric: She has a normal mood and affect. Her behavior is normal. Judgment and thought content normal.  Nursing note and vitals reviewed.  Assessment/Plan: 1. Atopic dermatitis, unspecified type Add triamcinolone 0.025% cream. Apply to affected area twice daily.  - triamcinolone (KENALOG) 0.025 % cream; Apply 1 application topically 2 (two) times daily.  Dispense: 80 g; Refill: 2  2. Left flank pain, chronic Tenderness of flank area, directly associated with area of bruisin/discoloration of the skin. Normal labs.  Will ultrasound kidneys for further evaluation.  - US Renal; Future  3. Attention and concentration deficit Ok to continue adderall 10mg  twice daily if needed. Three 30 day prescriptions provided today. Dates are 09/03/2017, 10/02/2017, and 10/30/2017 - amphetamine-dextroamphetamine (ADDERALL) 10 MG tablet; Take 1 tablet (10 mg total) by mouth 2 (two) times daily.  Dispense: 60 tablet; Refill: 0  4. Anxiety Ok to continue clonazepam 1mg  daily/at bedtime, as needed. New rx provided today.  - clonazePAM (KLONOPIN) 1 MG tablet; Take 1 tablet (1 mg total) by mouth daily as needed for anxiety.  Dispense: 30 tablet; Refill: 2  General Counseling: Neyah verbalizes understanding of the findings of todays visit and agrees with plan of treatment. I have discussed any further diagnostic evaluation that may be needed or ordered today. We also reviewed her medications today. she has been encouraged to call the office with any questions or concerns that should arise related to todays visit.  This patient was seen by Leretha Pol FNP Collaboration with Dr Lavera Guise as a part of collaborative care agreement  Orders Placed This Encounter  Procedures  . US Renal    Meds ordered this encounter  Medications  . triamcinolone (KENALOG) 0.025 % cream    Sig: Apply 1 application topically 2 (two) times daily.    Dispense:  80 g    Refill:  2    Order Specific Question:   Supervising Provider    Answer:   Lavera Guise [[id number]]  . DISCONTD: amphetamine-dextroamphetamine (ADDERALL) 10 MG tablet    Sig: Take 1 tablet (10 mg total) by mouth 2 (two) times daily.    Dispense:  60 tablet    Refill:  0    Order Specific Question:   Supervising Provider    Answer:   Lavera Guise [[id number]]  . clonazePAM (KLONOPIN) 1 MG tablet    Sig: Take 1 tablet (1 mg total) by mouth daily as needed for anxiety.    Dispense:  30 tablet    Refill:  2    Order Specific Question:   Supervising Provider    Answer:   Lavera Guise  [[id number]]  . DISCONTD: amphetamine-dextroamphetamine (ADDERALL) 10 MG tablet    Sig: Take 1 tablet (10 mg total) by mouth 2 (two) times daily.    Dispense:  60 tablet    Refill:  0    Fill after 10/02/2017  Order Specific Question:   Supervising Provider    Answer:   Lavera Guise [[id number]]  . amphetamine-dextroamphetamine (ADDERALL) 10 MG tablet    Sig: Take 1 tablet (10 mg total) by mouth 2 (two) times daily.    Dispense:  60 tablet    Refill:  0    Fill after 10/30/2017    Order Specific Question:   Supervising Provider    Answer:   Lavera Guise [[id number]]    Time spent: 40 Minutes      Dr Lavera Guise Internal medicine

## 2017-09-19 ENCOUNTER — Other Ambulatory Visit: Payer: Self-pay

## 2017-09-26 ENCOUNTER — Other Ambulatory Visit: Payer: Self-pay

## 2017-10-10 ENCOUNTER — Other Ambulatory Visit: Payer: Self-pay

## 2017-10-24 ENCOUNTER — Other Ambulatory Visit: Payer: Self-pay | Admitting: Nurse Practitioner

## 2017-10-24 MED ORDER — FLUOXETINE HCL 20 MG PO CAPS: ORAL_CAPSULE | ORAL | 0 refills | Status: DC

## 2017-11-01 ENCOUNTER — Other Ambulatory Visit: Payer: Self-pay

## 2017-11-01 ENCOUNTER — Ambulatory Visit
Admission: EM | Admit: 2017-11-01 | Discharge: 2017-11-01 | Disposition: A | Discharge status: Home / Self Care | Payer: BLUE CROSS/BLUE SHIELD | Attending: Family Medicine | Admitting: Family Medicine

## 2017-11-01 DIAGNOSIS — R300 Dysuria: Secondary | ICD-10-CM

## 2017-11-01 DIAGNOSIS — F17210 Nicotine dependence, cigarettes, uncomplicated: Secondary | ICD-10-CM

## 2017-11-01 DIAGNOSIS — J029 Acute pharyngitis, unspecified: Secondary | ICD-10-CM

## 2017-11-01 DIAGNOSIS — J069 Acute upper respiratory infection, unspecified: Secondary | ICD-10-CM

## 2017-11-01 DIAGNOSIS — R062 Wheezing: Secondary | ICD-10-CM

## 2017-11-01 LAB — URINALYSIS, COMPLETE (UACMP) WITH MICROSCOPIC
Bilirubin Urine: NEGATIVE
Glucose, UA: NEGATIVE mg/dL
Ketones, ur: NEGATIVE mg/dL
Nitrite: NEGATIVE
Protein, ur: NEGATIVE mg/dL
Specific Gravity, Urine: 1.015 (ref 1.005–1.030)
pH: 5 (ref 5.0–8.0)

## 2017-11-01 MED ORDER — PREDNISONE 10 MG PO TABS: ORAL_TABLET | ORAL | 0 refills | Status: DC

## 2017-11-01 MED ORDER — DOXYCYCLINE HYCLATE 100 MG PO CAPS
100.0000 mg | ORAL_CAPSULE | Freq: Two times a day (BID) | ORAL | 0 refills | Status: DC
Start: 2017-11-01 — End: 2018-03-09

## 2017-11-01 MED ORDER — BENZONATATE 100 MG PO CAPS: 100.0000 mg | ORAL_CAPSULE | Freq: Three times a day (TID) | ORAL | 0 refills | Status: DC | PRN

## 2017-11-01 MED ORDER — ALBUTEROL SULFATE HFA 108 (90 BASE) MCG/ACT IN AERS: 2.0000 | INHALATION_SPRAY | RESPIRATORY_TRACT | 0 refills | Status: DC | PRN

## 2017-11-01 NOTE — ED Triage Notes (Addendum)
Pt reports "I think I have a UTI." Also wants to be checked for her cough and congestion. Reports "I smoke at least a pack a day."

## 2017-11-01 NOTE — Discharge Instructions (Addendum)
Take medication as prescribed. Rest. Drink plenty of fluids.  ° °Follow up with your primary care physician this week as needed. Return to Urgent care for new or worsening concerns.  ° °

## 2017-11-01 NOTE — ED Provider Notes (Signed)
[facility] ____________________________________________  Time seen: Approximately 2:00 PM  I have reviewed the triage vital signs and the nursing notes.   HISTORY  Chief Complaint Dysuria and Cough   HPI Tamara Garrett is a 52 y.o. female presenting for evaluation of 4 days of runny nose, nasal congestion, sore throat and cough.  Reports has intermittently been hearing herself wheeze.  Sometimes cough is productive.  Denies hemoptysis.  States occasional tightness in chest, but denies any chest pain or shortness of breath.  States tightness is only present when she hears herself wheeze.  Patient reports that she has a chronic smoker but denies any known history of COPD.  No history of asthma.  Patient states that she would not be surprised that she has COPD, but reports she has had a gap in insurance and has not been following with her primary care regularly.  Denies known fevers.  Sore throat currently mild, was moderate earlier in the week.  Has been taken over-the-counter cough and congestion medication without resolution.  Patient also reports concern for urinary tract infection.  States that she has a history of recurrent UTIs.  Does report that she has kidney stones as well, but denies any flank pain at this time consistent with previous kidney stones.  States urinary concerns is that she has some intermittent pressure and burning with urination. denies urinary frequency, urinary urgency, vaginal discharge, abdominal pain or atypical back pain.  States that she wanted to make sure she did not have a UTI.  Denies chest pain, shortness of breath, abdominal pain, rash.  Denies recent sickness or recent antibiotic use.  Past Medical History:  Diagnosis Date  . Anxiety   . Chronic kidney disease    kidney stones  . GERD (gastroesophageal reflux disease)   Chronic back pain  Patient Active Problem List   Diagnosis Date Noted  . Atopic dermatitis 09/03/2017  . Left  flank pain, chronic 09/03/2017  . Attention and concentration deficit 09/03/2017  . Abnormal bruising 06/11/2017  . Nasal mucositis (ulcerative) 06/11/2017  . Anxiety 02/25/2017    Past Surgical History:  Procedure Laterality Date  . ABDOMINAL HYSTERECTOMY    . BREAST BIOPSY Left 2005   benign  . COLONOSCOPY    . COLONOSCOPY WITH PROPOFOL N/A 04/02/2016   Procedure: COLONOSCOPY WITH PROPOFOL;  Surgeon: Lollie Sails, MD;  Location: [facility] ENDOSCOPY;  Service: Endoscopy;  Laterality: N/A;  . KIDNEY STONE SURGERY Right   . LAPAROSCOPIC GASTRIC BYPASS       No current facility-administered medications for this encounter.   Current Outpatient Medications:  .  albuterol (PROVENTIL HFA;VENTOLIN HFA) 108 (90 Base) MCG/ACT inhaler, Inhale 2 puffs into the lungs every 4 (four) hours as needed for wheezing., Disp: 1 Inhaler, Rfl: 0 .  amphetamine-dextroamphetamine (ADDERALL) 10 MG tablet, Take 1 tablet (10 mg total) by mouth 2 (two) times daily., Disp: 60 tablet, Rfl: 0 .  benzonatate (TESSALON PERLES) 100 MG capsule, Take 1 capsule (100 mg total) by mouth 3 (three) times daily as needed for cough., Disp: 15 capsule, Rfl: 0 .  clonazePAM (KLONOPIN) 1 MG tablet, Take 1 tablet (1 mg total) by mouth daily as needed for anxiety., Disp: 30 tablet, Rfl: 2 .  doxycycline (VIBRAMYCIN) 100 MG capsule, Take 1 capsule (100 mg total) by mouth 2 (two) times daily., Disp: 20 capsule, Rfl: 0 .  estradiol (ESTRACE) 0.1 MG/GM vaginal cream, Use pea sized application to the urethra each night at bedtime each night.,  Disp: , Rfl:  .  FLUoxetine (PROZAC) 20 MG capsule, Take 1 tab po daily, Disp: 90 capsule, Rfl: 0 .  gabapentin (NEURONTIN) 600 MG tablet, Take 600 mg by mouth 3 (three) times daily., Disp: , Rfl:  .  mupirocin ointment (BACTROBAN) 2 %, Place 1 application into the nose 2 (two) times daily., Disp: 22 g, Rfl: 1 .  omeprazole (PRILOSEC) 20 MG capsule, TAKE 1 CAPSULE BY MOUTH EVERY DAY, Disp: 90  capsule, Rfl: 0 .  oxyCODONE-acetaminophen (PERCOCET/ROXICET) 5-325 MG tablet, Take 1 tablet by mouth every 8 (eight) hours as needed for severe pain., Disp: , Rfl:  .  predniSONE (DELTASONE) 10 MG tablet, Start 60 mg po day one, then 50 mg po day two, taper by 10 mg daily until complete., Disp: 21 tablet, Rfl: 0  Allergies Nsaids and Sulfamethoxazole-trimethoprim  Family History  Problem Relation Age of Onset  . Colon polyps Mother   . COPD Mother   . Congestive Heart Failure Mother   . Diabetes Mother   . Dementia Father   . Stroke Father     Social History Social History   Tobacco Use  . Smoking status: Current Every Day Smoker    Packs/day: 1.00    Types: Cigarettes  . Smokeless tobacco: Never Used  Substance Use Topics  . Alcohol use: No  . Drug use: No    Review of Systems Constitutional: No fever ENT: As above.  Cardiovascular: Denies chest pain. Respiratory: Denies shortness of breath. Gastrointestinal: No abdominal pain.   Musculoskeletal: Negative for back pain. Skin: Negative for rash.   ____________________________________________   PHYSICAL EXAM:  VITAL SIGNS: ED Triage Vitals  Enc Vitals Group     BP 11/01/17 1249 108/72     Pulse Rate 11/01/17 1249 75     Resp 11/01/17 1249 18     Temp 11/01/17 1249 98 F (36.7 C)     Temp Source 11/01/17 1249 Oral     SpO2 11/01/17 1249 98 %     Weight 11/01/17 1246 175 lb (79.4 kg)     Height 11/01/17 1246 5\' 4"  (1.626 m)     Head Circumference --      Peak Flow --      Pain Score 11/01/17 1246 3     Pain Loc --      Pain Edu? --      Excl. in [place]? --     Constitutional: Alert and oriented. Well appearing and in no acute distress. Eyes: Conjunctivae are normal.  Head: Atraumatic. No sinus tenderness to palpation. No swelling. No erythema.  Ears: no erythema, normal TMs bilaterally.   Nose:Nasal congestion   Mouth/Throat: Mucous membranes are moist. No pharyngeal erythema. No tonsillar swelling or  exudate.  Neck: No stridor.  No cervical spine tenderness to palpation. Hematological/Lymphatic/Immunilogical: No cervical lymphadenopathy. Cardiovascular: Normal rate, regular rhythm. Grossly normal heart sounds.  Good peripheral circulation. Respiratory: Normal respiratory effort.  No retractions.  Mild scattered rhonchi.  Scattered mild inspiratory and expiratory wheezes.  Dry intermittent cough with bronchospasm.  Speaks in complete sentences.  Good air movement.  Gastrointestinal: Soft and nontender. No CVA tenderness. Musculoskeletal: Ambulatory with steady gait.  Neurologic:  Normal speech and language. No gait instability. Skin:  Skin appears warm, dry and intact. No rash noted. Psychiatric: Mood and affect are normal. Speech and behavior are normal.  ___________________________________________   LABS (all labs ordered are listed, but only abnormal results are displayed)  Labs Reviewed  URINALYSIS, COMPLETE (  UACMP) WITH MICROSCOPIC - Abnormal; Notable for the following components:      Result Value   Hgb urine dipstick MODERATE (*)    Leukocytes, UA TRACE (*)    Bacteria, UA RARE (*)    All other components within normal limits  URINE CULTURE    PROCEDURES Procedures   INITIAL IMPRESSION / ASSESSMENT AND PLAN / ED COURSE  Pertinent labs & imaging results that were available during my care of the patient were reviewed by me and considered in my medical decision making (see chart for details).  Well-appearing patient.  No acute distress.  Patient chronic smoker with scattered wheezes.  Counseled regarding possible diagnosis of COPD.  Patient expressed concern that she does not currently have insurance.  Will defer chest x-ray at this time, patient agrees.  Also reports some dysuria complaints, urinalysis reviewed and discussed with patient, not clear UTI.  Discussed with patient and agreed to culture urine.  Will start patient on oral doxycycline, prednisone taper, PRN  albuterol inhaler, PRN Tessalon Perles.  Encourage rest, fluids, supportive care.  Discussed strict follow-up and return parameters.Discussed indication, risks and benefits of medications with patient.  Discussed follow up and return parameters including no resolution or any worsening concerns. Patient verbalized understanding and agreed to plan.   ____________________________________________   FINAL CLINICAL IMPRESSION(S) / ED DIAGNOSES  Final diagnoses:  Upper respiratory tract infection, unspecified type  Wheezing  Dysuria     ED Discharge Orders         Ordered    doxycycline (VIBRAMYCIN) 100 MG capsule  2 times daily     11/01/17 1336    benzonatate (TESSALON PERLES) 100 MG capsule  3 times daily PRN     11/01/17 1336    predniSONE (DELTASONE) 10 MG tablet     11/01/17 1336    albuterol (PROVENTIL HFA;VENTOLIN HFA) 108 (90 Base) MCG/ACT inhaler  Every 4 hours PRN     11/01/17 1336           Note: This dictation was prepared with Dragon dictation along with smaller phrase technology. Any transcriptional errors that result from this process are unintentional.         Marylene Land, NP 11/01/17 1429

## 2017-11-04 LAB — URINE CULTURE: Culture: >=100000 — AB

## 2017-11-05 ENCOUNTER — Telehealth (HOSPITAL_COMMUNITY): Payer: Self-pay

## 2017-11-05 MED ORDER — CEPHALEXIN 500 MG PO CAPS: 500.0000 mg | ORAL_CAPSULE | Freq: Two times a day (BID) | ORAL | 0 refills | Status: AC

## 2017-11-05 NOTE — Telephone Encounter (Signed)
Urine culture positive for klebsiella pneumoniae. This was not treated at [facility] visit. rx for Keflex 500 mg bid x 5 days sent to pharmacy of record. Attempted to reach patient. No answer at this time.

## 2017-11-07 ENCOUNTER — Telehealth (HOSPITAL_COMMUNITY): Payer: Self-pay

## 2017-11-07 NOTE — Telephone Encounter (Signed)
Pt aware of results and new rx.

## 2017-12-04 ENCOUNTER — Ambulatory Visit: Payer: Self-pay | Admitting: Nurse Practitioner

## 2017-12-05 ENCOUNTER — Ambulatory Visit: Payer: Self-pay | Admitting: Nurse Practitioner

## 2017-12-05 ENCOUNTER — Encounter: Payer: Self-pay | Admitting: Nurse Practitioner

## 2017-12-05 VITALS — BP 137/71 | HR 59 | Resp 16 | Ht 64.0 in | Wt 183.0 lb

## 2017-12-05 DIAGNOSIS — Z79899 Other long term (current) drug therapy: Secondary | ICD-10-CM

## 2017-12-05 DIAGNOSIS — N390 Urinary tract infection, site not specified: Secondary | ICD-10-CM | POA: Insufficient documentation

## 2017-12-05 DIAGNOSIS — J069 Acute upper respiratory infection, unspecified: Secondary | ICD-10-CM | POA: Insufficient documentation

## 2017-12-05 DIAGNOSIS — R319 Hematuria, unspecified: Secondary | ICD-10-CM

## 2017-12-05 DIAGNOSIS — R300 Dysuria: Secondary | ICD-10-CM

## 2017-12-05 DIAGNOSIS — R41840 Attention and concentration deficit: Secondary | ICD-10-CM

## 2017-12-05 DIAGNOSIS — F419 Anxiety disorder, unspecified: Secondary | ICD-10-CM

## 2017-12-05 LAB — POCT URINE DRUG SCREEN
POC Amphetamine UR: NOT DETECTED
POC BENZODIAZEPINES UR: POSITIVE — AB
POC Barbiturate UR: NOT DETECTED
POC Cocaine UR: NOT DETECTED
POC Ecstasy UR: NOT DETECTED
POC Marijuana UR: NOT DETECTED
POC Methadone UR: NOT DETECTED
POC Methamphetamine UR: NOT DETECTED
POC Opiate Ur: POSITIVE — AB
POC Oxycodone UR: POSITIVE — AB
POC PHENCYCLIDINE UR: NOT DETECTED
POC TRICYCLICS UR: NOT DETECTED

## 2017-12-05 LAB — POCT URINALYSIS DIPSTICK
Bilirubin, UA: NEGATIVE
Glucose, UA: NEGATIVE
Ketones, UA: NEGATIVE
Leukocytes, UA: NEGATIVE
Nitrite, UA: NEGATIVE
Protein, UA: NEGATIVE
Spec Grav, UA: 1.01 (ref 1.010–1.025)
Urobilinogen, UA: 0.2 E.U./dL
pH, UA: 6.5 (ref 5.0–8.0)

## 2017-12-05 MED ORDER — CLONAZEPAM 1 MG PO TABS: 1.0000 mg | ORAL_TABLET | Freq: Every day | ORAL | 2 refills | Status: DC | PRN

## 2017-12-05 MED ORDER — AMPHETAMINE-DEXTROAMPHETAMINE 10 MG PO TABS: 10.0000 mg | ORAL_TABLET | Freq: Two times a day (BID) | ORAL | 0 refills | Status: DC

## 2017-12-05 MED ORDER — CIPROFLOXACIN HCL 500 MG PO TABS: 500.0000 mg | ORAL_TABLET | Freq: Two times a day (BID) | ORAL | 0 refills | Status: DC

## 2017-12-05 NOTE — Progress Notes (Signed)
[facility] [place], [place] [address]  Internal MEDICINE  Office Visit Note  Patient Name: Tamara Garrett  [id number]  [id number]  Date of Service: 12/05/2017  Chief Complaint  Patient presents with  . Anxiety  . Gastroesophageal Reflux  . Urinary Tract Infection    The patient is here for routine follow up exam. Does feel some pressure in her bladder and lower back pain. Was treated for URI and urine was positive for infection. She was treated with two antibiotics. She took doxycycline twice daily and added macrobid twice daily. She did get better after that for a little while. Symptoms started right back after she stopped antibiotics. She does have long history of UTIs and kidney stones. Always feels some symptoms and never knows for sure if she has infection or not.  She also complains of cough, sore throat, and congestion. Restarted symptoms a few days ago. Denies fever nausea, or vomiting.  She needs to have refills for her routine medications today. She does take adderall 10mg  tablets twice daily when needed. Mostly, she takes this in the mornings before work. Will sometimes take the afternoon dose. She takes clonazepam 1mg , generally at bedtime, to help with anxiety and insomnia. A UDS performed is appropriately positive for BZO and very light positive for AMP. She is positive for oxycodone. She sees a pain management provider for this medicaiton and it's management.       Current Medication: Outpatient Encounter Medications as of 12/05/2017  Medication Sig  . albuterol (PROVENTIL HFA;VENTOLIN HFA) 108 (90 Base) MCG/ACT inhaler Inhale 2 puffs into the lungs every 4 (four) hours as needed for wheezing.  Marland Kitchen amphetamine-dextroamphetamine (ADDERALL) 10 MG tablet Take 1 tablet (10 mg total) by mouth 2 (two) times daily.  . benzonatate (TESSALON PERLES) 100 MG capsule Take 1 capsule (100 mg total) by mouth 3 (three) times daily as needed for cough.  .  clonazePAM (KLONOPIN) 1 MG tablet Take 1 tablet (1 mg total) by mouth daily as needed for anxiety.  Marland Kitchen doxycycline (VIBRAMYCIN) 100 MG capsule Take 1 capsule (100 mg total) by mouth 2 (two) times daily.  Marland Kitchen estradiol (ESTRACE) 0.1 MG/GM vaginal cream Use pea sized application to the urethra each night at bedtime each night.  Marland Kitchen FLUoxetine (PROZAC) 20 MG capsule Take 1 tab po daily  . gabapentin (NEURONTIN) 600 MG tablet Take 600 mg by mouth 3 (three) times daily.  . mupirocin ointment (BACTROBAN) 2 % Place 1 application into the nose 2 (two) times daily.  Marland Kitchen omeprazole (PRILOSEC) 20 MG capsule TAKE 1 CAPSULE BY MOUTH EVERY DAY  . oxyCODONE-acetaminophen (PERCOCET/ROXICET) 5-325 MG tablet Take 1 tablet by mouth every 8 (eight) hours as needed for severe pain.  . predniSONE (DELTASONE) 10 MG tablet Start 60 mg po day one, then 50 mg po day two, taper by 10 mg daily until complete.  . [DISCONTINUED] amphetamine-dextroamphetamine (ADDERALL) 10 MG tablet Take 1 tablet (10 mg total) by mouth 2 (two) times daily.  . [DISCONTINUED] amphetamine-dextroamphetamine (ADDERALL) 10 MG tablet Take 1 tablet (10 mg total) by mouth 2 (two) times daily.  . [DISCONTINUED] amphetamine-dextroamphetamine (ADDERALL) 10 MG tablet Take 1 tablet (10 mg total) by mouth 2 (two) times daily.  . [DISCONTINUED] amphetamine-dextroamphetamine (ADDERALL) 10 MG tablet Take 1 tablet (10 mg total) by mouth 2 (two) times daily.  . [DISCONTINUED] clonazePAM (KLONOPIN) 1 MG tablet Take 1 tablet (1 mg total) by mouth daily as needed for anxiety.  . ciprofloxacin (  CIPRO) 500 MG tablet Take 1 tablet (500 mg total) by mouth 2 (two) times daily.   No facility-administered encounter medications on file as of 12/05/2017.     Surgical History: Past Surgical History:  Procedure Laterality Date  . ABDOMINAL HYSTERECTOMY    . BREAST BIOPSY Left 2005   benign  . COLONOSCOPY    . COLONOSCOPY WITH PROPOFOL N/A 04/02/2016   Procedure: COLONOSCOPY  WITH PROPOFOL;  Surgeon: Lollie Sails, MD;  Location[facility] ENDOSCOPY;  Service: Endoscopy;  Laterality: N/A;  . KIDNEY STONE SURGERY Right   . LAPAROSCOPIC GASTRIC BYPASS      Medical History: Past Medical History:  Diagnosis Date  . Anxiety   . Chronic kidney disease    kidney stones  . GERD (gastroesophageal reflux disease)     Family History: Family History  Problem Relation Age of Onset  . Colon polyps Mother   . COPD Mother   . Congestive Heart Failure Mother   . Diabetes Mother   . Dementia Father   . Stroke Father     Social History   Socioeconomic History  . Marital status: Married    Spouse name: Not on file  . Number of children: Not on file  . Years of education: Not on file  . Highest education level: Not on file  Occupational History  . Not on file  Social Needs  . Financial resource strain: Not on file  . Food insecurity:    Worry: Not on file    Inability: Not on file  . Transportation needs:    Medical: Not on file    Non-medical: Not on file  Tobacco Use  . Smoking status: Current Every Day Smoker    Packs/day: 1.00    Types: Cigarettes  . Smokeless tobacco: Never Used  Substance and Sexual Activity  . Alcohol use: No  . Drug use: No  . Sexual activity: Not on file  Lifestyle  . Physical activity:    Days per week: Not on file    Minutes per session: Not on file  . Stress: Not on file  Relationships  . Social connections:    Talks on phone: Not on file    Gets together: Not on file    Attends religious service: Not on file    Active member of club or organization: Not on file    Attends meetings of clubs or organizations: Not on file    Relationship status: Not on file  . Intimate partner violence:    Fear of current or ex partner: Not on file    Emotionally abused: Not on file    Physically abused: Not on file    Forced sexual activity: Not on file  Other Topics Concern  . Not on file  Social History Narrative  . Not on  file      Review of Systems  Constitutional: Positive for fatigue. Negative for chills and unexpected weight change.  HENT: Positive for congestion, postnasal drip, rhinorrhea, sinus pain and sore throat. Negative for ear pain and voice change.   Respiratory: Positive for cough and wheezing.   Cardiovascular: Negative for chest pain and palpitations.  Gastrointestinal: Positive for nausea. Negative for vomiting.  Endocrine: Negative for cold intolerance, heat intolerance, polydipsia, polyphagia and polyuria.  Genitourinary: Positive for dysuria, flank pain and hematuria.  Musculoskeletal: Positive for back pain.  Skin: Negative.   Allergic/Immunologic: Positive for environmental allergies.  Neurological: Positive for headaches.  Hematological: Negative for adenopathy.  Psychiatric/Behavioral: Positive for decreased concentration. The patient is nervous/anxious.     Today's Vitals   12/05/17 0838  BP: 137/71  Pulse: (!) 59  Resp: 16  SpO2: 99%  Weight: 183 lb (83 kg)  Height: 5\' 4"  (1.626 m)    Physical Exam  Constitutional: She is oriented to person, place, and time. She appears well-developed and well-nourished. No distress.  HENT:  Head: Normocephalic and atraumatic.  Nose: Rhinorrhea present. Right sinus exhibits frontal sinus tenderness. Left sinus exhibits frontal sinus tenderness.  Mouth/Throat: Oropharyngeal exudate and posterior oropharyngeal erythema present.  Eyes: Pupils are equal, round, and reactive to light. EOM are normal.  Neck: Normal range of motion. Neck supple. No JVD present. No tracheal deviation present. No thyromegaly present.  Cardiovascular: Normal rate, regular rhythm and normal heart sounds. Exam reveals no gallop and no friction rub.  No murmur heard. Pulmonary/Chest: Effort normal and breath sounds normal. No respiratory distress. She has no wheezes. She has no rales. She exhibits no tenderness.  Congested, non-productive cough present.    Abdominal: Soft. Bowel sounds are normal.  Genitourinary:  Genitourinary Comments: Urine sample positive for large blood .  Lymphadenopathy:    She has cervical adenopathy.  Neurological: She is alert and oriented to person, place, and time. No cranial nerve deficit.  Skin: Skin is warm and dry. She is not diaphoretic.  Psychiatric: She has a normal mood and affect. Her behavior is normal. Judgment and thought content normal.  Nursing note and vitals reviewed.  Assessment/Plan: 1. Urinary tract infection with hematuria, site unspecified Will treat with cipro 500mg  bid for 10 days. Send urine for culture and sensitivity and adjust abx as indicated.  - ciproflox acin (CIPRO) 500 MG tablet; Take 1 tablet (500 mg total) by mouth 2 (two) times daily.  Dispense: 20 tablet; Refill: 0  2. Acute upper respiratory infection cipro to cover uri as well. Take OTC medication to alleviate symptoms.   3. Attention and concentration deficit May continue adderall 10mg  twice daily when needed for focus/concentration. Three 30 day prescriptions provided. Dates are 12/05/2017, 01/02/2018, and 01/31/2018 - amphetamine-dextroamphetamine (ADDERALL) 10 MG tablet; Take 1 tablet (10 mg total) by mouth 2 (two) times daily.  Dispense: 60 tablet; Refill: 0  4. Anxiety May continue clonazepam 1mg  one time daily as needed. A new prescription was sent to her pharmac.  - clonazePAM (KLONOPIN) 1 MG tablet; Take 1 tablet (1 mg total) by mouth daily as needed for anxiety.  Dispense: 30 tablet; Refill: 2  5. Encounter for long-term (current) use of medications - POCT Urine Drug Screen positive for BZO and OXY. Slight positive for AMP. Appropriate. She does seen pain management provider who administers narcotic pain medications.   6. Dysuria - POCT Urinalysis Dipstick  General Counseling: Jacee verbalizes understanding of the findings of todays visit and agrees with plan of treatment. I have discussed any further  diagnostic evaluation that may be needed or ordered today. We also reviewed her medications today. she has been encouraged to call the office with any questions or concerns that should arise related to todays visit.  Rest and increase fluids. Continue using OTC medication to control symptoms.   Refilled Controlled medications today. Reviewed risks and possible side effects associated with taking Stimulants. Combination of these drugs with other psychotropic medications could cause dizziness and drowsiness. Pt needs to Monitor symptoms and exercise caution in driving and operating heavy machinery to avoid damages to oneself, to others and to the surroundings. Patient  verbalized understanding in this matter. Dependence and abuse for these drugs will be monitored closely. A Controlled substance policy and procedure is on file which allows [facility] to order a urine drug screen test at any visit. Patient understands and agrees with the plan..  This patient was seen by Leretha Pol FNP Collaboration with Dr Lavera Guise as a part of collaborative care agreement  Orders Placed This Encounter  Procedures  . POCT Urinalysis Dipstick  . POCT Urine Drug Screen    Meds ordered this encounter  Medications  . DISCONTD: amphetamine-dextroamphetamine (ADDERALL) 10 MG tablet    Sig: Take 1 tablet (10 mg total) by mouth 2 (two) times daily.    Dispense:  60 tablet    Refill:  0    Order Specific Question:   Supervising Provider    Answer:   Lavera Guise [[id number]]  . clonazePAM (KLONOPIN) 1 MG tablet    Sig: Take 1 tablet (1 mg total) by mouth daily as needed for anxiety.    Dispense:  30 tablet    Refill:  2    Order Specific Question:   Supervising Provider    Answer:   Lavera Guise [[id number]]  . ciprofloxacin (CIPRO) 500 MG tablet    Sig: Take 1 tablet (500 mg total) by mouth 2 (two) times daily.    Dispense:  20 tablet    Refill:  0    Order Specific Question:   Supervising Provider     Answer:   Lavera Guise [[id number]]  . DISCONTD: amphetamine-dextroamphetamine (ADDERALL) 10 MG tablet    Sig: Take 1 tablet (10 mg total) by mouth 2 (two) times daily.    Dispense:  60 tablet    Refill:  0    Fill after 12/03/2017    Order Specific Question:   Supervising Provider    Answer:   Lavera Guise [[id number]]  . DISCONTD: amphetamine-dextroamphetamine (ADDERALL) 10 MG tablet    Sig: Take 1 tablet (10 mg total) by mouth 2 (two) times daily.    Dispense:  60 tablet    Refill:  0    Fill after 01/02/2018    Order Specific Question:   Supervising Provider    Answer:   Lavera Guise [[id number]]  . amphetamine-dextroamphetamine (ADDERALL) 10 MG tablet    Sig: Take 1 tablet (10 mg total) by mouth 2 (two) times daily.    Dispense:  60 tablet    Refill:  0    Fill after 01/31/2018    Order Specific Question:   Supervising Provider    Answer:   Lavera Guise [[id number]]    Time spent: 25 Minutes      Dr Lavera Guise Internal medicine

## 2017-12-05 NOTE — Addendum Note (Signed)
Addended by: Corlis Hove on: 12/05/2017 03:55 PM   Modules accepted: Orders

## 2017-12-07 LAB — CULTURE, URINE COMPREHENSIVE

## 2017-12-08 ENCOUNTER — Other Ambulatory Visit: Payer: Self-pay | Admitting: Nurse Practitioner

## 2017-12-08 ENCOUNTER — Telehealth: Payer: Self-pay

## 2017-12-08 DIAGNOSIS — R319 Hematuria, unspecified: Principal | ICD-10-CM

## 2017-12-08 DIAGNOSIS — N390 Urinary tract infection, site not specified: Secondary | ICD-10-CM

## 2017-12-08 MED ORDER — NITROFURANTOIN MONOHYD MACRO 100 MG PO CAPS: 100.0000 mg | ORAL_CAPSULE | Freq: Two times a day (BID) | ORAL | 0 refills | Status: DC

## 2017-12-08 NOTE — Telephone Encounter (Signed)
Pt advised that we add macrobid for Uti and finished cipro as prescribed for sinusitis

## 2017-12-08 NOTE — Progress Notes (Signed)
Please let the patient know that I added macrobid to help treat UTI. I would still like her to finish prescribed cipro, but needs to have the macrobid added. I sent this to her pharmacy. Thanks.

## 2018-02-19 ENCOUNTER — Other Ambulatory Visit: Payer: Self-pay

## 2018-02-19 MED ORDER — FLUOXETINE HCL 20 MG PO CAPS: ORAL_CAPSULE | ORAL | 0 refills | Status: DC

## 2018-03-09 ENCOUNTER — Encounter: Payer: Self-pay | Admitting: Nurse Practitioner

## 2018-03-09 ENCOUNTER — Ambulatory Visit: Payer: Self-pay | Admitting: Nurse Practitioner

## 2018-03-09 VITALS — BP 135/74 | HR 61 | Resp 16 | Ht 64.0 in | Wt 178.8 lb

## 2018-03-09 DIAGNOSIS — F419 Anxiety disorder, unspecified: Secondary | ICD-10-CM

## 2018-03-09 DIAGNOSIS — J069 Acute upper respiratory infection, unspecified: Secondary | ICD-10-CM

## 2018-03-09 DIAGNOSIS — R05 Cough: Secondary | ICD-10-CM

## 2018-03-09 DIAGNOSIS — R41840 Attention and concentration deficit: Secondary | ICD-10-CM

## 2018-03-09 DIAGNOSIS — R059 Cough, unspecified: Secondary | ICD-10-CM

## 2018-03-09 MED ORDER — CLONAZEPAM 1 MG PO TABS: 1.0000 mg | ORAL_TABLET | Freq: Every day | ORAL | 2 refills | Status: DC | PRN

## 2018-03-09 MED ORDER — AMPHETAMINE-DEXTROAMPHETAMINE 10 MG PO TABS: 10.0000 mg | ORAL_TABLET | Freq: Two times a day (BID) | ORAL | 0 refills | Status: DC

## 2018-03-09 MED ORDER — CEFUROXIME AXETIL 500 MG PO TABS: 500.0000 mg | ORAL_TABLET | Freq: Two times a day (BID) | ORAL | 0 refills | Status: DC

## 2018-03-09 MED ORDER — BENZONATATE 100 MG PO CAPS: 100.0000 mg | ORAL_CAPSULE | Freq: Three times a day (TID) | ORAL | 0 refills | Status: DC | PRN

## 2018-03-09 NOTE — Progress Notes (Signed)
[facility] [place], [place] [address]  Internal MEDICINE  Office Visit Note  Patient Name: Tamara Garrett  [id number]  [id number]  Date of Service: 03/09/2018  Chief Complaint  Patient presents with  . Gastroesophageal Reflux  . Anxiety    She also complains of cough, sore throat, and congestion. Significant cough at night. Keeping her and her husband awake at night. Symptoms started on Friday and have gradually become more severe.  She needs to have refills for her routine medications today. She does take adderall 10mg  tablets twice daily when needed. Mostly, she takes this in the mornings before work. Will sometimes take the afternoon dose. She takes clonazepam 1mg , generally at bedtime, to help with anxiety and insomnia.       Current Medication: Outpatient Encounter Medications as of 03/09/2018  Medication Sig  . albuterol (PROVENTIL HFA;VENTOLIN HFA) 108 (90 Base) MCG/ACT inhaler Inhale 2 puffs into the lungs every 4 (four) hours as needed for wheezing.  Marland Kitchen amphetamine-dextroamphetamine (ADDERALL) 10 MG tablet Take 1 tablet (10 mg total) by mouth 2 (two) times daily.  . benzonatate (TESSALON PERLES) 100 MG capsule Take 1 capsule (100 mg total) by mouth 3 (three) times daily as needed for cough.  . clonazePAM (KLONOPIN) 1 MG tablet Take 1 tablet (1 mg total) by mouth daily as needed for anxiety.  Marland Kitchen estradiol (ESTRACE) 0.1 MG/GM vaginal cream Use pea sized application to the urethra each night at bedtime each night.  Marland Kitchen FLUoxetine (PROZAC) 20 MG capsule Take 1 tab po daily  . gabapentin (NEURONTIN) 600 MG tablet Take 600 mg by mouth 3 (three) times daily.  . mupirocin ointment (BACTROBAN) 2 % Place 1 application into the nose 2 (two) times daily.  Marland Kitchen omeprazole (PRILOSEC) 20 MG capsule TAKE 1 CAPSULE BY MOUTH EVERY DAY  . oxyCODONE-acetaminophen (PERCOCET/ROXICET) 5-325 MG tablet Take 1 tablet by mouth every 8 (eight) hours as needed for severe pain.  .  predniSONE (DELTASONE) 10 MG tablet Start 60 mg po day one, then 50 mg po day two, taper by 10 mg daily until complete.  . [DISCONTINUED] amphetamine-dextroamphetamine (ADDERALL) 10 MG tablet Take 1 tablet (10 mg total) by mouth 2 (two) times daily.  . [DISCONTINUED] amphetamine-dextroamphetamine (ADDERALL) 10 MG tablet Take 1 tablet (10 mg total) by mouth 2 (two) times daily.  . [DISCONTINUED] amphetamine-dextroamphetamine (ADDERALL) 10 MG tablet Take 1 tablet (10 mg total) by mouth 2 (two) times daily.  . [DISCONTINUED] benzonatate (TESSALON PERLES) 100 MG capsule Take 1 capsule (100 mg total) by mouth 3 (three) times daily as needed for cough.  . [DISCONTINUED] clonazePAM (KLONOPIN) 1 MG tablet Take 1 tablet (1 mg total) by mouth daily as needed for anxiety.  . [DISCONTINUED] doxycycline (VIBRAMYCIN) 100 MG capsule Take 1 capsule (100 mg total) by mouth 2 (two) times daily.  . [DISCONTINUED] nitrofurantoin, macrocrystal-monohydrate, (MACROBID) 100 MG capsule Take 1 capsule (100 mg total) by mouth 2 (two) times daily.  . cefUROXime (CEFTIN) 500 MG tablet Take 1 tablet (500 mg total) by mouth 2 (two) times daily with a meal.  . [DISCONTINUED] ciprofloxacin (CIPRO) 500 MG tablet Take 1 tablet (500 mg total) by mouth 2 (two) times daily. (Patient not taking: Reported on 03/09/2018)   No facility-administered encounter medications on file as of 03/09/2018.     Surgical History: Past Surgical History:  Procedure Laterality Date  . ABDOMINAL HYSTERECTOMY    . BREAST BIOPSY Left 2005   benign  . COLONOSCOPY    .  COLONOSCOPY WITH PROPOFOL N/A 04/02/2016   Procedure: COLONOSCOPY WITH PROPOFOL;  Surgeon: Lollie Sails, MD;  Location: [facility] ENDOSCOPY;  Service: Endoscopy;  Laterality: N/A;  . KIDNEY STONE SURGERY Right   . LAPAROSCOPIC GASTRIC BYPASS      Medical History: Past Medical History:  Diagnosis Date  . Anxiety   . Chronic kidney disease    kidney stones  . GERD (gastroesophageal  reflux disease)     Family History: Family History  Problem Relation Age of Onset  . Colon polyps Mother   . COPD Mother   . Congestive Heart Failure Mother   . Diabetes Mother   . Dementia Father   . Stroke Father     Social History   Socioeconomic History  . Marital status: Married    Spouse name: Not on file  . Number of children: Not on file  . Years of education: Not on file  . Highest education level: Not on file  Occupational History  . Not on file  Social Needs  . Financial resource strain: Not on file  . Food insecurity:    Worry: Not on file    Inability: Not on file  . Transportation needs:    Medical: Not on file    Non-medical: Not on file  Tobacco Use  . Smoking status: Current Every Day Smoker    Packs/day: 1.00    Types: Cigarettes  . Smokeless tobacco: Never Used  Substance and Sexual Activity  . Alcohol use: No  . Drug use: No  . Sexual activity: Not on file  Lifestyle  . Physical activity:    Days per week: Not on file    Minutes per session: Not on file  . Stress: Not on file  Relationships  . Social connections:    Talks on phone: Not on file    Gets together: Not on file    Attends religious service: Not on file    Active member of club or organization: Not on file    Attends meetings of clubs or organizations: Not on file    Relationship status: Not on file  . Intimate partner violence:    Fear of current or ex partner: Not on file    Emotionally abused: Not on file    Physically abused: Not on file    Forced sexual activity: Not on file  Other Topics Concern  . Not on file  Social History Narrative  . Not on file      Review of Systems  Constitutional: Positive for fatigue. Negative for chills and unexpected weight change.  HENT: Positive for congestion, postnasal drip, rhinorrhea, sinus pain and sore throat. Negative for ear pain and voice change.   Respiratory: Positive for cough and wheezing.   Cardiovascular: Negative  for chest pain and palpitations.  Gastrointestinal: Positive for nausea. Negative for vomiting.  Endocrine: Negative for cold intolerance, heat intolerance, polydipsia and polyuria.  Musculoskeletal: Positive for back pain.  Skin: Negative for rash.  Allergic/Immunologic: Positive for environmental allergies.  Neurological: Positive for headaches.  Hematological: Negative for adenopathy.  Psychiatric/Behavioral: Positive for decreased concentration. The patient is nervous/anxious.     Today's Vitals   03/09/18 0903  BP: 135/74  Pulse: 61  Resp: 16  SpO2: 97%  Weight: 178 lb 12.8 oz (81.1 kg)  Height: 5\' 4"  (1.626 m)   Body mass index is 30.69 kg/m.  Physical Exam Vitals signs and nursing note reviewed.  Constitutional:      General: She  is not in acute distress.    Appearance: She is well-developed. She is ill-appearing. She is not diaphoretic.  HENT:     Head: Normocephalic and atraumatic.     Nose: Congestion and rhinorrhea present.     Right Sinus: Frontal sinus tenderness present.     Left Sinus: Frontal sinus tenderness present.     Mouth/Throat:     Pharynx: Oropharyngeal exudate and posterior oropharyngeal erythema present.  Eyes:     Pupils: Pupils are equal, round, and reactive to light.  Neck:     Musculoskeletal: Normal range of motion and neck supple.     Thyroid: No thyromegaly.     Vascular: No JVD.     Trachea: No tracheal deviation.  Cardiovascular:     Rate and Rhythm: Normal rate and regular rhythm.     Heart sounds: Normal heart sounds. No murmur. No friction rub. No gallop.   Pulmonary:     Effort: Pulmonary effort is normal. No respiratory distress.     Breath sounds: Wheezing present. No rales.     Comments: Scant, expiratory wheezing present with congested, non-productive cough Chest:     Chest wall: No tenderness.  Abdominal:     General: Bowel sounds are normal.     Palpations: Abdomen is soft.  Genitourinary:    Comments: Urine sample  positive for large blood . Lymphadenopathy:     Cervical: Cervical adenopathy present.  Skin:    General: Skin is warm and dry.  Neurological:     Mental Status: She is alert and oriented to person, place, and time.     Cranial Nerves: No cranial nerve deficit.  Psychiatric:        Behavior: Behavior normal.        Thought Content: Thought content normal.        Judgment: Judgment normal.    Assessment/Plan: 1. Acute upper respiratory infection Start ceftin 500mg  bid for 10 days. Rest and increase fluids. Take OTC medication to improve symptoms. Samples mucinex provided.  - cefUROXime (CEFTIN) 500 MG tablet; Take 1 tablet (500 mg total) by mouth 2 (two) times daily with a meal.  Dispense: 20 tablet; Refill: 0  2. Cough Tessalon perls can be taken three times daily as needed for cough. Samples delsym were also given. May be taken up to twice daily if needed for cough.  - benzonatate (TESSALON PERLES) 100 MG capsule; Take 1 capsule (100 mg total) by mouth 3 (three) times daily as needed for cough.  Dispense: 15 capsule; Refill: 0  3. Anxiety Renew clonazepam 1mg  at bedtime as needed for acute anxiety.  - clonazePAM (KLONOPIN) 1 MG tablet; Take 1 tablet (1 mg total) by mouth daily as needed for anxiety.  Dispense: 30 tablet; Refill: 2  4. Attention and concentration deficit May continue adderall 10mg  twice daily when needed for focus and concentration. Three 30 day prescriptions provided. Dates are 03/09/2018, 04/06/2018, and 04/04/2018 - amphetamine-dextroamphetamine (ADDERALL) 10 MG tablet; Take 1 tablet (10 mg total) by mouth 2 (two) times daily.  Dispense: 60 tablet; Refill: 0  General Counseling: Fatimah verbalizes understanding of the findings of todays visit and agrees with plan of treatment. I have discussed any further diagnostic evaluation that may be needed or ordered today. We also reviewed her medications today. she has been encouraged to call the office with any questions or  concerns that should arise related to todays visit.  Rest and increase fluids. Continue using OTC medication to control symptoms.  Refilled Controlled medications today. Reviewed risks and possible side effects associated with taking Stimulants. Combination of these drugs with other psychotropic medications could cause dizziness and drowsiness. Pt needs to Monitor symptoms and exercise caution in driving and operating heavy machinery to avoid damages to oneself, to others and to the surroundings. Patient verbalized understanding in this matter. Dependence and abuse for these drugs will be monitored closely. A Controlled substance policy and procedure is on file which allows [facility] to order a urine drug screen test at any visit. Patient understands and agrees with the plan..  This patient was seen by Leretha Pol FNP Collaboration with Dr Lavera Guise as a part of collaborative care agreement  Meds ordered this encounter  Medications  . cefUROXime (CEFTIN) 500 MG tablet    Sig: Take 1 tablet (500 mg total) by mouth 2 (two) times daily with a meal.    Dispense:  20 tablet    Refill:  0    Order Specific Question:   Supervising Provider    Answer:   Lavera Guise [place]  . benzonatate (TESSALON PERLES) 100 MG capsule    Sig: Take 1 capsule (100 mg total) by mouth 3 (three) times daily as needed for cough.    Dispense:  15 capsule    Refill:  0    Order Specific Question:   Supervising Provider    Answer:   Lavera Guise [[id number]]  . clonazePAM (KLONOPIN) 1 MG tablet    Sig: Take 1 tablet (1 mg total) by mouth daily as needed for anxiety.    Dispense:  30 tablet    Refill:  2    Order Specific Question:   Supervising Provider    Answer:   Lavera Guise [[id number]]  . DISCONTD: amphetamine-dextroamphetamine (ADDERALL) 10 MG tablet    Sig: Take 1 tablet (10 mg total) by mouth 2 (two) times daily.    Dispense:  60 tablet    Refill:  0    Order Specific Question:   Supervising  Provider    Answer:   Lavera Guise [[id number]]  . DISCONTD: amphetamine-dextroamphetamine (ADDERALL) 10 MG tablet    Sig: Take 1 tablet (10 mg total) by mouth 2 (two) times daily.    Dispense:  60 tablet    Refill:  0    Fill after 04/06/2018    Order Specific Question:   Supervising Provider    Answer:   Lavera Guise [place]  . amphetamine-dextroamphetamine (ADDERALL) 10 MG tablet    Sig: Take 1 tablet (10 mg total) by mouth 2 (two) times daily.    Dispense:  60 tablet    Refill:  0    Fill after 05/05/2018    Order Specific Question:   Supervising Provider    Answer:   Lavera Guise [[id number]]    Time spent: 25 Minutes      Dr Lavera Guise Internal medicine

## 2018-03-18 ENCOUNTER — Telehealth: Payer: Self-pay

## 2018-03-18 ENCOUNTER — Other Ambulatory Visit: Payer: Self-pay | Admitting: Nurse Practitioner

## 2018-03-18 DIAGNOSIS — R059 Cough, unspecified: Secondary | ICD-10-CM

## 2018-03-18 DIAGNOSIS — J069 Acute upper respiratory infection, unspecified: Secondary | ICD-10-CM

## 2018-03-18 DIAGNOSIS — R05 Cough: Secondary | ICD-10-CM

## 2018-03-18 MED ORDER — PREDNISONE 10 MG PO TABS: ORAL_TABLET | ORAL | 0 refills | Status: DC

## 2018-03-18 MED ORDER — LEVOFLOXACIN 500 MG PO TABS: 500.0000 mg | ORAL_TABLET | Freq: Every day | ORAL | 0 refills | Status: DC

## 2018-03-18 MED ORDER — BENZONATATE 100 MG PO CAPS: 100.0000 mg | ORAL_CAPSULE | Freq: Three times a day (TID) | ORAL | 0 refills | Status: DC | PRN

## 2018-03-18 NOTE — Telephone Encounter (Signed)
After finishing round antibiotics, patient still congested with cough. Will do round of levofloxacin 500mg  daily. Added tessalon perls as needed for cough. Also added prednosine dose pack for 6 days.

## 2018-03-18 NOTE — Telephone Encounter (Signed)
Pt advised we  Send another of antibiotic

## 2018-03-18 NOTE — Progress Notes (Signed)
After finishing round antibiotics, patient still congested with cough. Will do round of levofloxacin 500mg  daily. Added tessalon perls as needed for cough. Also added prednosine dose pack for 6 days.

## 2018-04-24 ENCOUNTER — Other Ambulatory Visit: Payer: Self-pay

## 2018-04-24 MED ORDER — OMEPRAZOLE 20 MG PO CPDR: DELAYED_RELEASE_CAPSULE | ORAL | 0 refills | Status: DC

## 2018-05-27 ENCOUNTER — Other Ambulatory Visit: Payer: Self-pay

## 2018-05-27 MED ORDER — FLUOXETINE HCL 20 MG PO CAPS: ORAL_CAPSULE | ORAL | 0 refills | Status: DC

## 2018-06-12 ENCOUNTER — Ambulatory Visit: Payer: BC Managed Care – PPO | Admitting: Nurse Practitioner

## 2018-06-12 ENCOUNTER — Encounter: Payer: Self-pay | Admitting: Nurse Practitioner

## 2018-06-12 ENCOUNTER — Other Ambulatory Visit: Payer: Self-pay

## 2018-06-12 VITALS — Ht 64.0 in | Wt 162.0 lb

## 2018-06-12 DIAGNOSIS — F419 Anxiety disorder, unspecified: Secondary | ICD-10-CM | POA: Diagnosis not present

## 2018-06-12 DIAGNOSIS — R5383 Other fatigue: Secondary | ICD-10-CM | POA: Diagnosis not present

## 2018-06-12 DIAGNOSIS — R41840 Attention and concentration deficit: Secondary | ICD-10-CM | POA: Diagnosis not present

## 2018-06-12 MED ORDER — PAROXETINE HCL 20 MG PO TABS: 20.0000 mg | ORAL_TABLET | Freq: Every day | ORAL | 3 refills | Status: DC

## 2018-06-12 MED ORDER — AMPHETAMINE-DEXTROAMPHETAMINE 10 MG PO TABS: 10.0000 mg | ORAL_TABLET | Freq: Two times a day (BID) | ORAL | 0 refills | Status: DC

## 2018-06-12 MED ORDER — CLONAZEPAM 1 MG PO TABS: 1.0000 mg | ORAL_TABLET | Freq: Every day | ORAL | 2 refills | Status: DC | PRN

## 2018-06-12 NOTE — Progress Notes (Signed)
[facility] [place], [place] [address]  Internal MEDICINE  Telephone Visit  Patient Name: Tamara Garrett  [id number]  [id number]   Date of Service: 06/28/2018  I connected with the patient at 3:27pm by telephone and verified the patients identity using two identifiers.   I discussed the limitations, risks, security and privacy concerns of performing an evaluation and management service by telephone and the availability of in person appointments. I also discussed with the patient that there may be a patient responsible charge related to the service.  The patient expressed understanding and agrees to proceed.    Chief Complaint  Patient presents with  . Telephone Assessment  . Telephone Screen  . Medication Refill    adderall    The patient has been contacted via telephone for follow up visit due to concerns for spread of novel coronavirus. She states that she is not really getting much help with anxiety and depression from fluoxetine. She states that she feels like she is taking this medication for no reason. She states that she does get relief of acute anxiety from clonazepam which she takes as needed in the evenings. She also does well with current dose of adderall. This keeps her on track and focused at work without negative side effects.       Current Medication: Outpatient Encounter Medications as of 06/12/2018  Medication Sig  . albuterol (PROVENTIL HFA;VENTOLIN HFA) 108 (90 Base) MCG/ACT inhaler Inhale 2 puffs into the lungs every 4 (four) hours as needed for wheezing.  Marland Kitchen amphetamine-dextroamphetamine (ADDERALL) 10 MG tablet Take 1 tablet (10 mg total) by mouth 2 (two) times daily.  . benzonatate (TESSALON PERLES) 100 MG capsule Take 1 capsule (100 mg total) by mouth 3 (three) times daily as needed for cough.  . clonazePAM (KLONOPIN) 1 MG tablet Take 1 tablet (1 mg total) by mouth daily as needed for anxiety.  Marland Kitchen estradiol (ESTRACE) 0.1 MG/GM vaginal  cream Use pea sized application to the urethra each night at bedtime each night.  Marland Kitchen FLUoxetine (PROZAC) 20 MG capsule Take 1 tab po daily  . gabapentin (NEURONTIN) 600 MG tablet Take 600 mg by mouth 3 (three) times daily.  . mupirocin ointment (BACTROBAN) 2 % Place 1 application into the nose 2 (two) times daily.  Marland Kitchen oxyCODONE-acetaminophen (PERCOCET/ROXICET) 5-325 MG tablet Take 1 tablet by mouth every 8 (eight) hours as needed for severe pain.  . [DISCONTINUED] amphetamine-dextroamphetamine (ADDERALL) 10 MG tablet Take 1 tablet (10 mg total) by mouth 2 (two) times daily.  . [DISCONTINUED] amphetamine-dextroamphetamine (ADDERALL) 10 MG tablet Take 1 tablet (10 mg total) by mouth 2 (two) times daily.  . [DISCONTINUED] amphetamine-dextroamphetamine (ADDERALL) 10 MG tablet Take 1 tablet (10 mg total) by mouth 2 (two) times daily.  . [DISCONTINUED] clonazePAM (KLONOPIN) 1 MG tablet Take 1 tablet (1 mg total) by mouth daily as needed for anxiety.  . [DISCONTINUED] omeprazole (PRILOSEC) 20 MG capsule TAKE 1 CAPSULE BY MOUTH EVERY DAY  . levofloxacin (LEVAQUIN) 500 MG tablet Take 1 tablet (500 mg total) by mouth daily. (Patient not taking: Reported on 06/12/2018)  . PARoxetine (PAXIL) 20 MG tablet Take 1 tablet (20 mg total) by mouth daily.  . predniSONE (DELTASONE) 10 MG tablet Start 60 mg po day one, then 50 mg po day two, taper by 10 mg daily until complete. (Patient not taking: Reported on 06/12/2018)   No facility-administered encounter medications on file as of 06/12/2018.     Surgical History: Past  Surgical History:  Procedure Laterality Date  . ABDOMINAL HYSTERECTOMY    . BREAST BIOPSY Left 2005   benign  . COLONOSCOPY    . COLONOSCOPY WITH PROPOFOL N/A 04/02/2016   Procedure: COLONOSCOPY WITH PROPOFOL;  Surgeon: Lollie Sails, MD;  Location: [facility] ENDOSCOPY;  Service: Endoscopy;  Laterality: N/A;  . KIDNEY STONE SURGERY Right   . LAPAROSCOPIC GASTRIC BYPASS      Medical History: Past  Medical History:  Diagnosis Date  . Anxiety   . Chronic kidney disease    kidney stones  . GERD (gastroesophageal reflux disease)     Family History: Family History  Problem Relation Age of Onset  . Colon polyps Mother   . COPD Mother   . Congestive Heart Failure Mother   . Diabetes Mother   . Dementia Father   . Stroke Father     Social History   Socioeconomic History  . Marital status: Married    Spouse name: Not on file  . Number of children: Not on file  . Years of education: Not on file  . Highest education level: Not on file  Occupational History  . Not on file  Social Needs  . Financial resource strain: Not on file  . Food insecurity:    Worry: Not on file    Inability: Not on file  . Transportation needs:    Medical: Not on file    Non-medical: Not on file  Tobacco Use  . Smoking status: Current Every Day Smoker    Packs/day: 1.00    Types: Cigarettes  . Smokeless tobacco: Never Used  Substance and Sexual Activity  . Alcohol use: No  . Drug use: No  . Sexual activity: Not on file  Lifestyle  . Physical activity:    Days per week: Not on file    Minutes per session: Not on file  . Stress: Not on file  Relationships  . Social connections:    Talks on phone: Not on file    Gets together: Not on file    Attends religious service: Not on file    Active member of club or organization: Not on file    Attends meetings of clubs or organizations: Not on file    Relationship status: Not on file  . Intimate partner violence:    Fear of current or ex partner: Not on file    Emotionally abused: Not on file    Physically abused: Not on file    Forced sexual activity: Not on file  Other Topics Concern  . Not on file  Social History Narrative  . Not on file      Review of Systems  Constitutional: Positive for fatigue. Negative for chills and unexpected weight change.  HENT: Negative for congestion, ear pain, postnasal drip, rhinorrhea, sinus pain, sore  throat and voice change.   Respiratory: Negative for cough and wheezing.   Cardiovascular: Negative for chest pain and palpitations.  Gastrointestinal: Negative for nausea and vomiting.  Endocrine: Negative for cold intolerance, heat intolerance, polydipsia and polyuria.  Musculoskeletal: Positive for back pain.  Skin: Negative for rash.  Allergic/Immunologic: Positive for environmental allergies.  Neurological: Positive for headaches.  Hematological: Negative for adenopathy.  Psychiatric/Behavioral: Positive for decreased concentration and dysphoric mood. The patient is nervous/anxious.     Today's Vitals   06/12/18 1523  Weight: 162 lb (73.5 kg)  Height: 5\' 4"  (1.626 m)   Body mass index is 27.81 kg/m.  Observation/Objective:   The  patient is alert and oriented. She is pleasant and answers all questions appropriately. Breathing is non-labored. She is in no acute distress at this time.    Assessment/Plan: 1. Other fatigue Likely due from increased anxiety and depression. Will monitor.  2. Anxiety D/c fluoxetine and start paroxetine 20mg  daily. Reviewed instructions to start new medication and wean off the fluoxetine. patinet voiced understanding of these instructions. May continue clonazepam 1mg  every evening as needed for acute anxiety. New prescription sent to her pharmacy.  - PARoxetine (PAXIL) 20 MG tablet; Take 1 tablet (20 mg total) by mouth daily.  Dispense: 30 tablet; Refill: 3 - clonazePAM (KLONOPIN) 1 MG tablet; Take 1 tablet (1 mg total) by mouth daily as needed for anxiety.  Dispense: 30 tablet; Refill: 2  3. Attention and concentration deficit May continue adderall 10mg  three times daily as needed for focus and concentration. Three 30 day prescriptions provided for her pharmacy. Dates are 06/12/2018, 07/11/2018, and 08/08/2018.  - amphetamine-dextroamphetamine (ADDERALL) 10 MG tablet; Take 1 tablet (10 mg total) by mouth 2 (two) times daily.  Dispense: 60 tablet;  Refill: 0  General Counseling: Tamara Garrett verbalizes understanding of the findings of today's phone visit and agrees with plan of treatment. I have discussed any further diagnostic evaluation that may be needed or ordered today. We also reviewed her medications today. she has been encouraged to call the office with any questions or concerns that should arise related to todays visit.  Refilled Controlled medications today. Reviewed risks and possible side effects associated with taking Stimulants. Combination of these drugs with other psychotropic medications could cause dizziness and drowsiness. Pt needs to Monitor symptoms and exercise caution in driving and operating heavy machinery to avoid damages to oneself, to others and to the surroundings. Patient verbalized understanding in this matter. Dependence and abuse for these drugs will be monitored closely. A Controlled substance policy and procedure is on file which allows [facility] to order a urine drug screen test at any visit. Patient understands and agrees with the plan..  This patient was seen by Leretha Pol FNP Collaboration with Dr Lavera Guise as a part of collaborative care agreement  Meds ordered this encounter  Medications  . PARoxetine (PAXIL) 20 MG tablet    Sig: Take 1 tablet (20 mg total) by mouth daily.    Dispense:  30 tablet    Refill:  3    Weaning off fluoxetine.    Order Specific Question:   Supervising Provider    Answer:   Lavera Guise [[id number]]  . DISCONTD: amphetamine-dextroamphetamine (ADDERALL) 10 MG tablet    Sig: Take 1 tablet (10 mg total) by mouth 2 (two) times daily.    Dispense:  60 tablet    Refill:  0    Order Specific Question:   Supervising Provider    Answer:   Lavera Guise [[id number]]  . clonazePAM (KLONOPIN) 1 MG tablet    Sig: Take 1 tablet (1 mg total) by mouth daily as needed for anxiety.    Dispense:  30 tablet    Refill:  2    Order Specific Question:   Supervising Provider    Answer:    Lavera Guise [[id number]]  . DISCONTD: amphetamine-dextroamphetamine (ADDERALL) 10 MG tablet    Sig: Take 1 tablet (10 mg total) by mouth 2 (two) times daily.    Dispense:  60 tablet    Refill:  0    Fill after 07/11/2018    Order  Specific Question:   Supervising Provider    Answer:   Lavera Guise [[id number]]  . amphetamine-dextroamphetamine (ADDERALL) 10 MG tablet    Sig: Take 1 tablet (10 mg total) by mouth 2 (two) times daily.    Dispense:  60 tablet    Refill:  0    Fill after 08/08/2018    Order Specific Question:   Supervising Provider    Answer:   Lavera Guise [[id number]]    Time spent: 77 Minutes    Dr Lavera Guise Internal medicine

## 2018-06-18 ENCOUNTER — Other Ambulatory Visit: Payer: Self-pay

## 2018-06-18 MED ORDER — OMEPRAZOLE 20 MG PO CPDR: DELAYED_RELEASE_CAPSULE | ORAL | 1 refills | Status: DC

## 2018-06-28 DIAGNOSIS — R5383 Other fatigue: Secondary | ICD-10-CM | POA: Insufficient documentation

## 2018-07-04 ENCOUNTER — Other Ambulatory Visit: Payer: Self-pay | Admitting: Nurse Practitioner

## 2018-07-04 DIAGNOSIS — F419 Anxiety disorder, unspecified: Secondary | ICD-10-CM

## 2018-08-27 ENCOUNTER — Other Ambulatory Visit: Payer: Self-pay

## 2018-08-27 MED ORDER — FLUOXETINE HCL 20 MG PO CAPS: ORAL_CAPSULE | ORAL | 0 refills | Status: DC

## 2018-09-03 ENCOUNTER — Encounter: Payer: Self-pay | Admitting: Nurse Practitioner

## 2018-09-03 ENCOUNTER — Ambulatory Visit: Payer: BC Managed Care – PPO | Admitting: Nurse Practitioner

## 2018-09-03 ENCOUNTER — Other Ambulatory Visit: Payer: Self-pay

## 2018-09-03 VITALS — Temp 99.1°F | Wt 160.0 lb

## 2018-09-03 DIAGNOSIS — Z20828 Contact with and (suspected) exposure to other viral communicable diseases: Secondary | ICD-10-CM | POA: Diagnosis not present

## 2018-09-03 DIAGNOSIS — R197 Diarrhea, unspecified: Secondary | ICD-10-CM | POA: Diagnosis not present

## 2018-09-03 DIAGNOSIS — K529 Noninfective gastroenteritis and colitis, unspecified: Secondary | ICD-10-CM | POA: Diagnosis not present

## 2018-09-03 DIAGNOSIS — Z20822 Contact with and (suspected) exposure to covid-19: Secondary | ICD-10-CM

## 2018-09-03 MED ORDER — CIPROFLOXACIN HCL 500 MG PO TABS: 500.0000 mg | ORAL_TABLET | Freq: Two times a day (BID) | ORAL | 0 refills | Status: DC

## 2018-09-03 MED ORDER — DIPHENOXYLATE-ATROPINE 2.5-0.025 MG PO TABS: 1.0000 | ORAL_TABLET | Freq: Four times a day (QID) | ORAL | 0 refills | Status: AC | PRN

## 2018-09-03 NOTE — Progress Notes (Signed)
[facility] [place], [place] [address]  Internal MEDICINE  Telephone Visit  Patient Name: Tamara Garrett  [id number]  [id number]  Date of Service: 09/06/2018  I connected with the patient at 5:33pm by webcam and verified the patients identity using two identifiers.   I discussed the limitations, risks, security and privacy concerns of performing an evaluation and management service by webcam and the availability of in person appointments. I also discussed with the patient that there may be a patient responsible charge related to the service.  The patient expressed understanding and agrees to proceed.    Chief Complaint  Patient presents with  . Telephone Assessment  . Telephone Screen  . Diarrhea    over the past week and half, today only been to the bathroom once but still diarrhea  . Abdominal Cramping  . Sore Throat    comes and goes  . Migraine  . Urinary Tract Infection    possible    The patient has been contacted via webcam for follow up visit due to concerns for spread of novel coronavirus. The patient states that she had been having problems with diarrhea over the past few weeks. She generally has constipation. She also has sore throat, headache, nasal congestion, and upset stomach. Has been going on for at least two weeks. She feels like she may have had low-grade fever a few times over the past few weeks. She states that today is the best day she has had in at least two weeks. She does not believe she has been exposed to COVID 19.       Current Medication: Outpatient Encounter Medications as of 09/03/2018  Medication Sig  . albuterol (PROVENTIL HFA;VENTOLIN HFA) 108 (90 Base) MCG/ACT inhaler Inhale 2 puffs into the lungs every 4 (four) hours as needed for wheezing.  Marland Kitchen amphetamine-dextroamphetamine (ADDERALL) 10 MG tablet Take 1 tablet (10 mg total) by mouth 2 (two) times daily.  . clonazePAM (KLONOPIN) 1 MG tablet Take 1 tablet (1 mg total)  by mouth daily as needed for anxiety.  Marland Kitchen estradiol (ESTRACE) 0.1 MG/GM vaginal cream Use pea sized application to the urethra each night at bedtime each night.  . gabapentin (NEURONTIN) 600 MG tablet Take 600 mg by mouth 3 (three) times daily.  . mupirocin ointment (BACTROBAN) 2 % Place 1 application into the nose 2 (two) times daily.  Marland Kitchen omeprazole (PRILOSEC) 20 MG capsule TAKE 1 CAPSULE BY MOUTH EVERY DAY  . oxyCODONE-acetaminophen (PERCOCET/ROXICET) 5-325 MG tablet Take 1 tablet by mouth every 8 (eight) hours as needed for severe pain.  Marland Kitchen PARoxetine (PAXIL) 20 MG tablet TAKE 1 TABLET BY MOUTH EVERY DAY  . ciprofloxacin (CIPRO) 500 MG tablet Take 1 tablet (500 mg total) by mouth 2 (two) times daily.  . diphenoxylate-atropine (LOMOTIL) 2.5-0.025 MG tablet Take 1 tablet by mouth 4 (four) times daily as needed for diarrhea or loose stools.  . [DISCONTINUED] benzonatate (TESSALON PERLES) 100 MG capsule Take 1 capsule (100 mg total) by mouth 3 (three) times daily as needed for cough. (Patient not taking: Reported on 09/03/2018)  . [DISCONTINUED] FLUoxetine (PROZAC) 20 MG capsule Take 1 tab po daily (Patient not taking: Reported on 09/03/2018)  . [DISCONTINUED] levofloxacin (LEVAQUIN) 500 MG tablet Take 1 tablet (500 mg total) by mouth daily. (Patient not taking: Reported on 06/12/2018)  . [DISCONTINUED] predniSONE (DELTASONE) 10 MG tablet Start 60 mg po day one, then 50 mg po day two, taper by 10 mg daily until  complete. (Patient not taking: Reported on 06/12/2018)   No facility-administered encounter medications on file as of 09/03/2018.     Surgical History: Past Surgical History:  Procedure Laterality Date  . ABDOMINAL HYSTERECTOMY    . BREAST BIOPSY Left 2005   benign  . COLONOSCOPY    . COLONOSCOPY WITH PROPOFOL N/A 04/02/2016   Procedure: COLONOSCOPY WITH PROPOFOL;  Surgeon: Lollie Sails, MD;  Location: [facility] ENDOSCOPY;  Service: Endoscopy;  Laterality: N/A;  . KIDNEY STONE SURGERY Right    . LAPAROSCOPIC GASTRIC BYPASS      Medical History: Past Medical History:  Diagnosis Date  . Anxiety   . Chronic kidney disease    kidney stones  . GERD (gastroesophageal reflux disease)     Family History: Family History  Problem Relation Age of Onset  . Colon polyps Mother   . COPD Mother   . Congestive Heart Failure Mother   . Diabetes Mother   . Dementia Father   . Stroke Father     Social History   Socioeconomic History  . Marital status: Married    Spouse name: Not on file  . Number of children: Not on file  . Years of education: Not on file  . Highest education level: Not on file  Occupational History  . Not on file  Social Needs  . Financial resource strain: Not on file  . Food insecurity    Worry: Not on file    Inability: Not on file  . Transportation needs    Medical: Not on file    Non-medical: Not on file  Tobacco Use  . Smoking status: Current Every Day Smoker    Packs/day: 1.00    Types: Cigarettes  . Smokeless tobacco: Never Used  Substance and Sexual Activity  . Alcohol use: No  . Drug use: No  . Sexual activity: Not on file  Lifestyle  . Physical activity    Days per week: Not on file    Minutes per session: Not on file  . Stress: Not on file  Relationships  . Social Herbalist on phone: Not on file    Gets together: Not on file    Attends religious service: Not on file    Active member of club or organization: Not on file    Attends meetings of clubs or organizations: Not on file    Relationship status: Not on file  . Intimate partner violence    Fear of current or ex partner: Not on file    Emotionally abused: Not on file    Physically abused: Not on file    Forced sexual activity: Not on file  Other Topics Concern  . Not on file  Social History Narrative  . Not on file      Review of Systems  Constitutional: Positive for fatigue and fever. Negative for chills and unexpected weight change.  HENT: Positive  for congestion, postnasal drip, rhinorrhea and sinus pressure. Negative for sneezing and sore throat.   Respiratory: Negative for cough, chest tightness and shortness of breath.   Cardiovascular: Negative for chest pain and palpitations.  Gastrointestinal: Positive for abdominal pain, diarrhea and nausea. Negative for constipation and vomiting.  Genitourinary: Positive for dysuria and frequency.  Musculoskeletal: Negative for arthralgias, back pain, joint swelling and neck pain.  Skin: Negative for rash.  Neurological: Positive for headaches. Negative for tremors and numbness.  Hematological: Negative for adenopathy. Does not bruise/bleed easily.  Psychiatric/Behavioral: Negative for behavioral  problems (Depression), sleep disturbance and suicidal ideas. The patient is not nervous/anxious.    Today's Vitals   09/03/18 1650  Temp: 99.1 F (37.3 C)  Weight: 160 lb (72.6 kg)   Body mass index is 27.46 kg/m.  Observation/Objective:   The patient is alert and oriented. She is pleasant and answers all questions appropriately. Breathing is non-labored. She is in no acute distress at this time.  The patient appears to feel poorly.    Assessment/Plan: 1. Gastroenteritis Start cipro 500mg  twice daily. Bland diet recommended. Advance as tolerated.  - ciprofloxacin (CIPRO) 500 MG tablet; Take 1 tablet (500 mg total) by mouth 2 (two) times daily.  Dispense: 20 tablet; Refill: 0  2. Diarrhea, unspecified type May take lomotil up to four times daily as needed for acute diarrhea.  - diphenoxylate-atropine (LOMOTIL) 2.5-0.025 MG tablet; Take 1 tablet by mouth 4 (four) times daily as needed for diarrhea or loose stools.  Dispense: 30 tablet; Refill: 0  3. Exposure to Covid-19 Virus Patient plans to go to [facility] for testing of COVID 19.   General Counseling: Josslyn verbalizes understanding of the findings of today's phone visit and agrees with plan of treatment. I have discussed any further diagnostic  evaluation that may be needed or ordered today. We also reviewed her medications today. she has been encouraged to call the office with any questions or concerns that should arise related to todays visit.  The 'BRAT' diet is suggested, then progress to diet as tolerated as symptoms abate. Call if bloody stools, persistent diarrhea, vomiting, fever or abdominal pain.  This patient was seen by [place] with Dr Lavera Guise as a part of collaborative care agreement  Meds ordered this encounter  Medications  . ciprofloxacin (CIPRO) 500 MG tablet    Sig: Take 1 tablet (500 mg total) by mouth 2 (two) times daily.    Dispense:  20 tablet    Refill:  0    Order Specific Question:   Supervising Provider    Answer:   Lavera Guise [[id number]]  . diphenoxylate-atropine (LOMOTIL) 2.5-0.025 MG tablet    Sig: Take 1 tablet by mouth 4 (four) times daily as needed for diarrhea or loose stools.    Dispense:  30 tablet    Refill:  0    Order Specific Question:   Supervising Provider    Answer:   Lavera Guise [[id number]]    Time spent: 53 Minutes    Dr Lavera Guise Internal medicine

## 2018-09-06 DIAGNOSIS — Z20822 Contact with and (suspected) exposure to covid-19: Secondary | ICD-10-CM | POA: Insufficient documentation

## 2018-09-06 DIAGNOSIS — K529 Noninfective gastroenteritis and colitis, unspecified: Secondary | ICD-10-CM | POA: Insufficient documentation

## 2018-09-06 DIAGNOSIS — R197 Diarrhea, unspecified: Secondary | ICD-10-CM | POA: Insufficient documentation

## 2018-09-06 DIAGNOSIS — Z20828 Contact with and (suspected) exposure to other viral communicable diseases: Secondary | ICD-10-CM | POA: Insufficient documentation

## 2018-09-11 ENCOUNTER — Ambulatory Visit: Payer: Self-pay | Admitting: Nurse Practitioner

## 2018-09-25 ENCOUNTER — Ambulatory Visit: Payer: BC Managed Care – PPO | Admitting: Nurse Practitioner

## 2018-09-25 ENCOUNTER — Other Ambulatory Visit: Payer: Self-pay

## 2018-09-25 ENCOUNTER — Encounter: Payer: Self-pay | Admitting: Nurse Practitioner

## 2018-09-25 VITALS — Temp 98.8°F | Ht 64.0 in | Wt 165.0 lb

## 2018-09-25 DIAGNOSIS — F339 Major depressive disorder, recurrent, unspecified: Secondary | ICD-10-CM | POA: Diagnosis not present

## 2018-09-25 DIAGNOSIS — J019 Acute sinusitis, unspecified: Secondary | ICD-10-CM | POA: Diagnosis not present

## 2018-09-25 DIAGNOSIS — R41840 Attention and concentration deficit: Secondary | ICD-10-CM

## 2018-09-25 DIAGNOSIS — F419 Anxiety disorder, unspecified: Secondary | ICD-10-CM

## 2018-09-25 DIAGNOSIS — Z20828 Contact with and (suspected) exposure to other viral communicable diseases: Secondary | ICD-10-CM

## 2018-09-25 DIAGNOSIS — K219 Gastro-esophageal reflux disease without esophagitis: Secondary | ICD-10-CM

## 2018-09-25 DIAGNOSIS — Z20822 Contact with and (suspected) exposure to covid-19: Secondary | ICD-10-CM

## 2018-09-25 MED ORDER — CLONAZEPAM 1 MG PO TABS: 1.0000 mg | ORAL_TABLET | Freq: Every day | ORAL | 2 refills | Status: DC | PRN

## 2018-09-25 MED ORDER — AZITHROMYCIN 250 MG PO TABS: ORAL_TABLET | ORAL | 0 refills | Status: DC

## 2018-09-25 MED ORDER — AMPHETAMINE-DEXTROAMPHETAMINE 10 MG PO TABS: 10.0000 mg | ORAL_TABLET | Freq: Two times a day (BID) | ORAL | 0 refills | Status: DC

## 2018-09-25 MED ORDER — OMEPRAZOLE 20 MG PO CPDR: DELAYED_RELEASE_CAPSULE | ORAL | 1 refills | Status: DC

## 2018-09-25 MED ORDER — PAROXETINE HCL 30 MG PO TABS: 30.0000 mg | ORAL_TABLET | Freq: Every day | ORAL | 3 refills | Status: DC

## 2018-09-25 NOTE — Progress Notes (Signed)
[facility] [place], [place] [address]  Internal MEDICINE  Telephone Visit  Patient Name: Tamara Garrett  [id number]  [id number]  Date of Service: 10/07/2018  I connected with the patient at 4:11pm by webcam and verified the patients identity using two identifiers.   I discussed the limitations, risks, security and privacy concerns of performing an evaluation and management service by webcam and the availability of in person appointments. I also discussed with the patient that there may be a patient responsible charge related to the service.  The patient expressed understanding and agrees to proceed.    Chief Complaint  Patient presents with  . Telephone Assessment  . Telephone Screen  . Sinusitis  . Anxiety  . Medication Refill    The patient has been contacted via webcam for follow up visit due to concerns for spread of novel coronavirus. Patient has nasal congestion, scratchy throat, headache, and cough. she is also having diarrhea. She denies fever or chills. She is having body aches In neck and shoulders. Symptoms started a few days ago. She had similar symptoms the last time we spoke. Was encouraged to go for COVID 19. She felt better the very next day and never went for testing.       Current Medication: Outpatient Encounter Medications as of 09/25/2018  Medication Sig  . albuterol (PROVENTIL HFA;VENTOLIN HFA) 108 (90 Base) MCG/ACT inhaler Inhale 2 puffs into the lungs every 4 (four) hours as needed for wheezing.  Marland Kitchen amphetamine-dextroamphetamine (ADDERALL) 10 MG tablet Take 1 tablet (10 mg total) by mouth 2 (two) times daily.  . clonazePAM (KLONOPIN) 1 MG tablet Take 1 tablet (1 mg total) by mouth daily as needed for anxiety.  . diphenoxylate-atropine (LOMOTIL) 2.5-0.025 MG tablet Take 1 tablet by mouth 4 (four) times daily as needed for diarrhea or loose stools.  Marland Kitchen estradiol (ESTRACE) 0.1 MG/GM vaginal cream Use pea sized application to the urethra  each night at bedtime each night.  . gabapentin (NEURONTIN) 600 MG tablet Take 600 mg by mouth 3 (three) times daily.  . mupirocin ointment (BACTROBAN) 2 % Place 1 application into the nose 2 (two) times daily.  Marland Kitchen omeprazole (PRILOSEC) 20 MG capsule TAKE 1 CAPSULE BY MOUTH EVERY DAY  . oxyCODONE-acetaminophen (PERCOCET/ROXICET) 5-325 MG tablet Take 1 tablet by mouth every 8 (eight) hours as needed for severe pain.  . [DISCONTINUED] amphetamine-dextroamphetamine (ADDERALL) 10 MG tablet Take 1 tablet (10 mg total) by mouth 2 (two) times daily.  . [DISCONTINUED] amphetamine-dextroamphetamine (ADDERALL) 10 MG tablet Take 1 tablet (10 mg total) by mouth 2 (two) times daily.  . [DISCONTINUED] amphetamine-dextroamphetamine (ADDERALL) 10 MG tablet Take 1 tablet (10 mg total) by mouth 2 (two) times daily.  . [DISCONTINUED] clonazePAM (KLONOPIN) 1 MG tablet Take 1 tablet (1 mg total) by mouth daily as needed for anxiety.  . [DISCONTINUED] omeprazole (PRILOSEC) 20 MG capsule TAKE 1 CAPSULE BY MOUTH EVERY DAY  . [DISCONTINUED] PARoxetine (PAXIL) 20 MG tablet TAKE 1 TABLET BY MOUTH EVERY DAY  . azithromycin (ZITHROMAX) 250 MG tablet z-pack - take as directed for 5 days for sinusitis  . PARoxetine (PAXIL) 30 MG tablet Take 1 tablet (30 mg total) by mouth daily.  . [DISCONTINUED] ciprofloxacin (CIPRO) 500 MG tablet Take 1 tablet (500 mg total) by mouth 2 (two) times daily. (Patient not taking: Reported on 09/25/2018)   No facility-administered encounter medications on file as of 09/25/2018.     Surgical History: Past Surgical History:  Procedure  Laterality Date  . ABDOMINAL HYSTERECTOMY    . BREAST BIOPSY Left 2005   benign  . COLONOSCOPY    . COLONOSCOPY WITH PROPOFOL N/A 04/02/2016   Procedure: COLONOSCOPY WITH PROPOFOL;  Surgeon: Lollie Sails, MD;  Location: [facility] ENDOSCOPY;  Service: Endoscopy;  Laterality: N/A;  . KIDNEY STONE SURGERY Right   . LAPAROSCOPIC GASTRIC BYPASS      Medical  History: Past Medical History:  Diagnosis Date  . Anxiety   . Chronic kidney disease    kidney stones  . GERD (gastroesophageal reflux disease)     Family History: Family History  Problem Relation Age of Onset  . Colon polyps Mother   . COPD Mother   . Congestive Heart Failure Mother   . Diabetes Mother   . Dementia Father   . Stroke Father     Social History   Socioeconomic History  . Marital status: Married    Spouse name: Not on file  . Number of children: Not on file  . Years of education: Not on file  . Highest education level: Not on file  Occupational History  . Not on file  Social Needs  . Financial resource strain: Not on file  . Food insecurity    Worry: Not on file    Inability: Not on file  . Transportation needs    Medical: Not on file    Non-medical: Not on file  Tobacco Use  . Smoking status: Current Every Day Smoker    Packs/day: 1.00    Types: Cigarettes  . Smokeless tobacco: Never Used  Substance and Sexual Activity  . Alcohol use: No  . Drug use: No  . Sexual activity: Not on file  Lifestyle  . Physical activity    Days per week: Not on file    Minutes per session: Not on file  . Stress: Not on file  Relationships  . Social Herbalist on phone: Not on file    Gets together: Not on file    Attends religious service: Not on file    Active member of club or organization: Not on file    Attends meetings of clubs or organizations: Not on file    Relationship status: Not on file  . Intimate partner violence    Fear of current or ex partner: Not on file    Emotionally abused: Not on file    Physically abused: Not on file    Forced sexual activity: Not on file  Other Topics Concern  . Not on file  Social History Narrative  . Not on file      Review of Systems  Constitutional: Positive for activity change, chills, fatigue and fever. Negative for unexpected weight change.  HENT: Positive for congestion, postnasal drip,  rhinorrhea and sinus pressure. Negative for sneezing and sore throat.   Respiratory: Positive for cough. Negative for chest tightness and shortness of breath.   Cardiovascular: Negative for chest pain and palpitations.  Gastrointestinal: Positive for abdominal pain, diarrhea and nausea. Negative for constipation and vomiting.  Genitourinary: Negative for dysuria and frequency.  Musculoskeletal: Positive for back pain. Negative for arthralgias, joint swelling and neck pain.  Skin: Negative for rash.  Allergic/Immunologic: Positive for environmental allergies.  Neurological: Positive for headaches. Negative for tremors and numbness.  Hematological: Negative for adenopathy. Does not bruise/bleed easily.  Psychiatric/Behavioral: Positive for dysphoric mood. Negative for behavioral problems (Depression), sleep disturbance and suicidal ideas. The patient is nervous/anxious.  Today's Vitals   09/25/18 1456  Temp: 98.8 F (37.1 C)  Weight: 165 lb (74.8 kg)  Height: 5\' 4"  (1.626 m)   Body mass index is 28.32 kg/m.  Observation/Objective:   The patient is alert and oriented. She is pleasant and answers all questions appropriately. Breathing is non-labored. She is in no acute distress at this time. The patient is nasally congested and appears to feel ill.    Assessment/Plan: 1. Acute non-recurrent sinusitis, unspecified location Start z-pack. Take as directed for 5 days. Rest and increase fluids. Recommend OTC tylenol to improve pain/fever.  - azithromycin (ZITHROMAX) 250 MG tablet; z-pack - take as directed for 5 days for sinusitis  Dispense: 6 tablet; Refill: 0  2. Exposure to Covid-19 Virus Send for COVID 19 testing. Discuss results when available. - Novel Coronavirus, NAA ([facility]); Future  3. Gastroesophageal reflux disease without esophagitis May take omeprazole 20mg  daily as needed.  - omeprazole (PRILOSEC) 20 MG capsule; TAKE 1 CAPSULE BY MOUTH EVERY DAY  Dispense: 90 capsule;  Refill: 1  4. Episode of recurrent major depressive disorder, unspecified depression episode severity (HCC) paxil 30mg  daily.  - PARoxetine (PAXIL) 30 MG tablet; Take 1 tablet (30 mg total) by mouth daily.  Dispense: 30 tablet; Refill: 3  5. Anxiety May take clnoazepam 0.5mg  daily/at bedtime as needed for acute anxiety.  - clonazePAM (KLONOPIN) 1 MG tablet; Take 1 tablet (1 mg total) by mouth daily as needed for anxiety.  Dispense: 30 tablet; Refill: 2  6. Attention and concentration deficit May continue to take adderall 10mg  up to twice daily as needed for focu and concentration. Three 30 day prescriptions provided. Dates are 09/25/2018, 10/23/2018, and 11/21/2018 - amphetamine-dextroamphetamine (ADDERALL) 10 MG tablet; Take 1 tablet (10 mg total) by mouth 2 (two) times daily.  Dispense: 60 tablet; Refill: 0  General Counseling: Suzan verbalizes understanding of the findings of today's phone visit and agrees with plan of treatment. I have discussed any further diagnostic evaluation that may be needed or ordered today. We also reviewed her medications today. she has been encouraged to call the office with any questions or concerns that should arise related to todays visit.  Rest and increase fluids. Continue using OTC medication to control symptoms.   Refilled Controlled medications today. Reviewed risks and possible side effects associated with taking Stimulants. Combination of these drugs with other psychotropic medications could cause dizziness and drowsiness. Pt needs to Monitor symptoms and exercise caution in driving and operating heavy machinery to avoid damages to oneself, to others and to the surroundings. Patient verbalized understanding in this matter. Dependence and abuse for these drugs will be monitored closely. A Controlled substance policy and procedure is on file which allows [facility] to order a urine drug screen test at any visit. Patient understands and agrees with  the plan..  This patient was seen by Leretha Pol FNP Collaboration with Dr Lavera Guise as a part of collaborative care agreement  Orders Placed This Encounter  Procedures  . Novel Coronavirus, NAA ([facility])    Meds ordered this encounter  Medications  . azithromycin (ZITHROMAX) 250 MG tablet    Sig: z-pack - take as directed for 5 days for sinusitis    Dispense:  6 tablet    Refill:  0    Order Specific Question:   Supervising Provider    Answer:   Lavera Guise [id number]  . DISCONTD: amphetamine-dextroamphetamine (ADDERALL) 10 MG tablet    Sig: Take 1 tablet (  10 mg total) by mouth 2 (two) times daily.    Dispense:  60 tablet    Refill:  0    Order Specific Question:   Supervising Provider    Answer:   Lavera Guise [id number]  . clonazePAM (KLONOPIN) 1 MG tablet    Sig: Take 1 tablet (1 mg total) by mouth daily as needed for anxiety.    Dispense:  30 tablet    Refill:  2    Order Specific Question:   Supervising Provider    Answer:   Lavera Guise [id number]  . PARoxetine (PAXIL) 30 MG tablet    Sig: Take 1 tablet (30 mg total) by mouth daily.    Dispense:  30 tablet    Refill:  3    Order Specific Question:   Supervising Provider    Answer:   Lavera Guise [id number]  . DISCONTD: amphetamine-dextroamphetamine (ADDERALL) 10 MG tablet    Sig: Take 1 tablet (10 mg total) by mouth 2 (two) times daily.    Dispense:  60 tablet    Refill:  0    Fill after 10/23/2018    Order Specific Question:   Supervising Provider    Answer:   Lavera Guise [id number]  . amphetamine-dextroamphetamine (ADDERALL) 10 MG tablet    Sig: Take 1 tablet (10 mg total) by mouth 2 (two) times daily.    Dispense:  60 tablet    Refill:  0    Fill after 11/20/2018    Order Specific Question:   Supervising Provider    Answer:   Lavera Guise [id number]  . omeprazole (PRILOSEC) 20 MG capsule    Sig: TAKE 1 CAPSULE BY MOUTH EVERY DAY    Dispense:  90 capsule    Refill:  1    Order Specific Question:   Supervising  Provider    Answer:   Lavera Guise [id number]    Time spent: 34 Minutes    Dr Lavera Guise Internal medicine

## 2018-09-28 DIAGNOSIS — Z1159 Encounter for screening for other viral diseases: Secondary | ICD-10-CM | POA: Diagnosis not present

## 2018-10-07 DIAGNOSIS — F339 Major depressive disorder, recurrent, unspecified: Secondary | ICD-10-CM | POA: Insufficient documentation

## 2018-10-07 DIAGNOSIS — K219 Gastro-esophageal reflux disease without esophagitis: Secondary | ICD-10-CM | POA: Insufficient documentation

## 2018-10-07 DIAGNOSIS — J019 Acute sinusitis, unspecified: Secondary | ICD-10-CM | POA: Insufficient documentation

## 2018-10-15 NOTE — Addendum Note (Signed)
Addended by: Leretha Pol on: 10/15/2018 02:29 PM   Modules accepted: Level of Service

## 2018-10-19 ENCOUNTER — Other Ambulatory Visit: Payer: Self-pay | Admitting: Nurse Practitioner

## 2018-10-19 DIAGNOSIS — F339 Major depressive disorder, recurrent, unspecified: Secondary | ICD-10-CM

## 2018-11-05 ENCOUNTER — Other Ambulatory Visit: Payer: Self-pay

## 2018-11-05 ENCOUNTER — Encounter: Payer: Self-pay | Admitting: Adult Health

## 2018-11-05 ENCOUNTER — Ambulatory Visit: Payer: BC Managed Care – PPO | Admitting: Adult Health

## 2018-11-05 VITALS — Temp 99.5°F | Ht 64.0 in | Wt 165.0 lb

## 2018-11-05 DIAGNOSIS — R059 Cough, unspecified: Secondary | ICD-10-CM

## 2018-11-05 DIAGNOSIS — R05 Cough: Secondary | ICD-10-CM

## 2018-11-05 DIAGNOSIS — J029 Acute pharyngitis, unspecified: Secondary | ICD-10-CM

## 2018-11-05 DIAGNOSIS — B3731 Acute candidiasis of vulva and vagina: Secondary | ICD-10-CM

## 2018-11-05 DIAGNOSIS — B373 Candidiasis of vulva and vagina: Secondary | ICD-10-CM | POA: Diagnosis not present

## 2018-11-05 MED ORDER — AMOXICILLIN-POT CLAVULANATE 875-125 MG PO TABS: 1.0000 | ORAL_TABLET | Freq: Two times a day (BID) | ORAL | 0 refills | Status: DC

## 2018-11-05 MED ORDER — BENZONATATE 100 MG PO CAPS: 100.0000 mg | ORAL_CAPSULE | Freq: Two times a day (BID) | ORAL | 0 refills | Status: DC | PRN

## 2018-11-05 MED ORDER — FLUCONAZOLE 150 MG PO TABS: 150.0000 mg | ORAL_TABLET | ORAL | 0 refills | Status: DC | PRN

## 2018-11-05 NOTE — Progress Notes (Signed)
[facility] [place], [place] [address]  Internal MEDICINE  Telephone Visit  Patient Name: Tamara Garrett  [id number]  [id number]  Date of Service: 11/05/2018  I connected with the patient at 1230 by telephone and verified the patients identity using two identifiers.   I discussed the limitations, risks, security and privacy concerns of performing an evaluation and management service by telephone and the availability of in person appointments. I also discussed with the patient that there may be a patient responsible charge related to the service.  The patient expressed understanding and agrees to proceed.    Chief Complaint  Patient presents with  . Telephone Assessment  . Telephone Screen  . Fever  . Sore Throat    red, white spots, feels like she is swallowing glass  . Headache  . Cough    only in throat not in chest   . Fatigue    HPI  PT reports sore throat for 3 days.  She reports feeling like she is swallowing glass.  She now has a headache, and cough.  She has been out of work feeling bad.  She reports a low grade temp, and white spots on her throat.    Current Medication: Outpatient Encounter Medications as of 11/05/2018  Medication Sig  . albuterol (PROVENTIL HFA;VENTOLIN HFA) 108 (90 Base) MCG/ACT inhaler Inhale 2 puffs into the lungs every 4 (four) hours as needed for wheezing.  Marland Kitchen amphetamine-dextroamphetamine (ADDERALL) 10 MG tablet Take 1 tablet (10 mg total) by mouth 2 (two) times daily.  . clonazePAM (KLONOPIN) 1 MG tablet Take 1 tablet (1 mg total) by mouth daily as needed for anxiety.  . diphenoxylate-atropine (LOMOTIL) 2.5-0.025 MG tablet Take 1 tablet by mouth 4 (four) times daily as needed for diarrhea or loose stools.  Marland Kitchen estradiol (ESTRACE) 0.1 MG/GM vaginal cream Use pea sized application to the urethra each night at bedtime each night.  . gabapentin (NEURONTIN) 600 MG tablet Take 600 mg by mouth 3 (three) times daily.  .  mupirocin ointment (BACTROBAN) 2 % Place 1 application into the nose 2 (two) times daily.  Marland Kitchen omeprazole (PRILOSEC) 20 MG capsule TAKE 1 CAPSULE BY MOUTH EVERY DAY  . oxyCODONE-acetaminophen (PERCOCET/ROXICET) 5-325 MG tablet Take 1 tablet by mouth every 8 (eight) hours as needed for severe pain.  Marland Kitchen PARoxetine (PAXIL) 30 MG tablet TAKE 1 TABLET BY MOUTH EVERY DAY  . amoxicillin-clavulanate (AUGMENTIN) 875-125 MG tablet Take 1 tablet by mouth 2 (two) times daily.  . benzonatate (TESSALON) 100 MG capsule Take 1 capsule (100 mg total) by mouth 2 (two) times daily as needed for cough.  . fluconazole (DIFLUCAN) 150 MG tablet Take 1 tablet (150 mg total) by mouth every three (3) days as needed.  . [DISCONTINUED] azithromycin (ZITHROMAX) 250 MG tablet z-pack - take as directed for 5 days for sinusitis (Patient not taking: Reported on 11/05/2018)   No facility-administered encounter medications on file as of 11/05/2018.     Surgical History: Past Surgical History:  Procedure Laterality Date  . ABDOMINAL HYSTERECTOMY    . BREAST BIOPSY Left 2005   benign  . COLONOSCOPY    . COLONOSCOPY WITH PROPOFOL N/A 04/02/2016   Procedure: COLONOSCOPY WITH PROPOFOL;  Surgeon: Lollie Sails, MD;  Location: [facility] ENDOSCOPY;  Service: Endoscopy;  Laterality: N/A;  . KIDNEY STONE SURGERY Right   . LAPAROSCOPIC GASTRIC BYPASS      Medical History: Past Medical History:  Diagnosis Date  . Anxiety   .  Chronic kidney disease    kidney stones  . GERD (gastroesophageal reflux disease)     Family History: Family History  Problem Relation Age of Onset  . Colon polyps Mother   . COPD Mother   . Congestive Heart Failure Mother   . Diabetes Mother   . Dementia Father   . Stroke Father     Social History   Socioeconomic History  . Marital status: Married    Spouse name: Not on file  . Number of children: Not on file  . Years of education: Not on file  . Highest education level: Not on file   Occupational History  . Not on file  Social Needs  . Financial resource strain: Not on file  . Food insecurity    Worry: Not on file    Inability: Not on file  . Transportation needs    Medical: Not on file    Non-medical: Not on file  Tobacco Use  . Smoking status: Current Every Day Smoker    Packs/day: 1.00    Types: Cigarettes  . Smokeless tobacco: Never Used  Substance and Sexual Activity  . Alcohol use: No  . Drug use: No  . Sexual activity: Not on file  Lifestyle  . Physical activity    Days per week: Not on file    Minutes per session: Not on file  . Stress: Not on file  Relationships  . Social Herbalist on phone: Not on file    Gets together: Not on file    Attends religious service: Not on file    Active member of club or organization: Not on file    Attends meetings of clubs or organizations: Not on file    Relationship status: Not on file  . Intimate partner violence    Fear of current or ex partner: Not on file    Emotionally abused: Not on file    Physically abused: Not on file    Forced sexual activity: Not on file  Other Topics Concern  . Not on file  Social History Narrative  . Not on file      Review of Systems  Constitutional: Negative for chills, fatigue and unexpected weight change.  HENT: Negative for congestion, rhinorrhea, sneezing and sore throat.   Eyes: Negative for photophobia, pain and redness.  Respiratory: Negative for cough, chest tightness and shortness of breath.   Cardiovascular: Negative for chest pain and palpitations.  Gastrointestinal: Negative for abdominal pain, constipation, diarrhea, nausea and vomiting.  Endocrine: Negative.   Genitourinary: Negative for dysuria and frequency.  Musculoskeletal: Negative for arthralgias, back pain, joint swelling and neck pain.  Skin: Negative for rash.  Allergic/Immunologic: Negative.   Neurological: Negative for tremors and numbness.  Hematological: Negative for  adenopathy. Does not bruise/bleed easily.  Psychiatric/Behavioral: Negative for behavioral problems and sleep disturbance. The patient is not nervous/anxious.     Vital Signs: Temp 99.5 F (37.5 C)   Ht 5\' 4"  (1.626 m)   Wt 165 lb (74.8 kg)   BMI 28.32 kg/m    Observation/Objective:  Fair appearing, coughing on video. NAD noted.    Assessment/Plan: 1. Pharyngitis, unspecified etiology Advised patient to take entire course of antibiotics as prescribed with food. Pt should return to clinic in 7-10 days if symptoms fail to improve or new symptoms develop. - amoxicillin-clavulanate (AUGMENTIN) 875-125 MG tablet; Take 1 tablet by mouth 2 (two) times daily.  Dispense: 20 tablet; Refill: 0  2. Vaginal  candida Use Diflucan as directed.  - fluconazole (DIFLUCAN) 150 MG tablet; Take 1 tablet (150 mg total) by mouth every three (3) days as needed.  Dispense: 3 tablet; Refill: 0  3. Cough Use tessalon as directed.  - benzonatate (TESSALON) 100 MG capsule; Take 1 capsule (100 mg total) by mouth 2 (two) times daily as needed for cough.  Dispense: 20 capsule; Refill: 0  General Counseling: Dosha verbalizes understanding of the findings of today's phone visit and agrees with plan of treatment. I have discussed any further diagnostic evaluation that may be needed or ordered today. We also reviewed her medications today. she has been encouraged to call the office with any questions or concerns that should arise related to todays visit.    No orders of the defined types were placed in this encounter.   Meds ordered this encounter  Medications  . amoxicillin-clavulanate (AUGMENTIN) 875-125 MG tablet    Sig: Take 1 tablet by mouth 2 (two) times daily.    Dispense:  20 tablet    Refill:  0  . fluconazole (DIFLUCAN) 150 MG tablet    Sig: Take 1 tablet (150 mg total) by mouth every three (3) days as needed.    Dispense:  3 tablet    Refill:  0  . benzonatate (TESSALON) 100 MG capsule    Sig:  Take 1 capsule (100 mg total) by mouth 2 (two) times daily as needed for cough.    Dispense:  20 capsule    Refill:  0    Time spent: [place] AGNP-C Internal medicine

## 2018-11-09 ENCOUNTER — Telehealth: Payer: Self-pay | Admitting: Adult Health

## 2018-11-09 MED ORDER — PREDNISONE 10 MG (21) PO TBPK: ORAL_TABLET | ORAL | 0 refills | Status: DC

## 2018-11-09 NOTE — Telephone Encounter (Signed)
Pt states that she is not doing any better since Thursday , still having low grade fever , feels like it moved to the chest , continue with antibiotic , called in some prenisone per adam , treat fever with tylenol. Call back if symptoms not any better Thursday

## 2018-11-10 ENCOUNTER — Other Ambulatory Visit: Payer: Self-pay | Admitting: *Deleted

## 2018-11-10 DIAGNOSIS — Z20822 Contact with and (suspected) exposure to covid-19: Secondary | ICD-10-CM

## 2018-11-11 LAB — NOVEL CORONAVIRUS, NAA: SARS-CoV-2, NAA: NOT DETECTED

## 2018-11-18 ENCOUNTER — Telehealth: Payer: Self-pay

## 2018-11-18 ENCOUNTER — Other Ambulatory Visit: Payer: Self-pay | Admitting: Nurse Practitioner

## 2018-11-18 DIAGNOSIS — R059 Cough, unspecified: Secondary | ICD-10-CM

## 2018-11-18 DIAGNOSIS — J069 Acute upper respiratory infection, unspecified: Secondary | ICD-10-CM

## 2018-11-18 DIAGNOSIS — R05 Cough: Secondary | ICD-10-CM

## 2018-11-18 MED ORDER — LEVOFLOXACIN 500 MG PO TABS: 500.0000 mg | ORAL_TABLET | Freq: Every day | ORAL | 0 refills | Status: DC

## 2018-11-18 MED ORDER — HYDROCOD POLST-CPM POLST ER 10-8 MG/5ML PO SUER: 5.0000 mL | Freq: Two times a day (BID) | ORAL | 0 refills | Status: DC | PRN

## 2018-11-18 NOTE — Telephone Encounter (Signed)
Patient has been advised her medications are at pharmacy, if not better within a few days she will need a xhest x ray and repeat covid test. Tamara Garrett

## 2018-11-18 NOTE — Progress Notes (Signed)
Patient still having URI symptoms. Not relieved after round of augmentin. Will do trial of levofloxacin 500mg  daily for 10 days. Added tussionex to take twice daily if needed for cough. She should not drive or work after taking this due to side effects of sleepiness or dizziness. May consider getting chest x-ray and repeat COVID testing if no better over next few days.

## 2018-11-24 ENCOUNTER — Other Ambulatory Visit: Payer: Self-pay | Admitting: *Deleted

## 2018-11-24 DIAGNOSIS — Z20822 Contact with and (suspected) exposure to covid-19: Secondary | ICD-10-CM

## 2018-11-25 LAB — NOVEL CORONAVIRUS, NAA: SARS-CoV-2, NAA: NOT DETECTED

## 2018-11-27 ENCOUNTER — Telehealth: Payer: Self-pay

## 2018-11-27 NOTE — Telephone Encounter (Signed)
Pt advised covid test is negative as per heather gave her note to go back to work

## 2018-12-04 ENCOUNTER — Telehealth: Payer: Self-pay

## 2018-12-04 NOTE — Telephone Encounter (Signed)
SCREENED AND CONFIRMED 12-08-18 OV.

## 2018-12-08 ENCOUNTER — Ambulatory Visit: Payer: BC Managed Care – PPO | Admitting: Nurse Practitioner

## 2018-12-21 ENCOUNTER — Telehealth: Payer: Self-pay

## 2018-12-21 ENCOUNTER — Other Ambulatory Visit: Payer: Self-pay | Admitting: Nurse Practitioner

## 2018-12-21 DIAGNOSIS — N390 Urinary tract infection, site not specified: Secondary | ICD-10-CM

## 2018-12-21 DIAGNOSIS — R300 Dysuria: Secondary | ICD-10-CM

## 2018-12-21 MED ORDER — PHENAZOPYRIDINE HCL 200 MG PO TABS: 200.0000 mg | ORAL_TABLET | Freq: Three times a day (TID) | ORAL | 0 refills | Status: DC | PRN

## 2018-12-21 MED ORDER — CIPROFLOXACIN HCL 500 MG PO TABS: 500.0000 mg | ORAL_TABLET | Freq: Two times a day (BID) | ORAL | 0 refills | Status: DC

## 2018-12-21 NOTE — Telephone Encounter (Signed)
uti type symptoms with bladder pain. Sent cipro 500mg  bid for 10 days. Also added pyridium 200mg  which can be taken up to three times daily if needed for bladder pain. Both sent to her pharmacy.

## 2018-12-21 NOTE — Telephone Encounter (Signed)
Pt advised we send med to [facility] for uti if you not feeling better need to been seen

## 2018-12-21 NOTE — Progress Notes (Signed)
uti type symptoms with bladder pain. Sent cipro 500mg  bid for 10 days. Also added pyridium 200mg  which can be taken up to three times daily if needed for bladder pain. Both sent to her pharmacy.

## 2018-12-31 ENCOUNTER — Telehealth: Payer: Self-pay

## 2018-12-31 ENCOUNTER — Other Ambulatory Visit: Payer: Self-pay | Admitting: Nurse Practitioner

## 2018-12-31 DIAGNOSIS — F419 Anxiety disorder, unspecified: Secondary | ICD-10-CM

## 2018-12-31 DIAGNOSIS — R41840 Attention and concentration deficit: Secondary | ICD-10-CM

## 2018-12-31 MED ORDER — CLONAZEPAM 1 MG PO TABS: 1.0000 mg | ORAL_TABLET | Freq: Every day | ORAL | 2 refills | Status: DC | PRN

## 2018-12-31 MED ORDER — AMPHETAMINE-DEXTROAMPHETAMINE 10 MG PO TABS: 10.0000 mg | ORAL_TABLET | Freq: Two times a day (BID) | ORAL | 0 refills | Status: DC

## 2018-12-31 NOTE — Telephone Encounter (Signed)
lmom 

## 2018-12-31 NOTE — Telephone Encounter (Signed)
Renewed adderall and clonazepam and sent both to [facility] for her.

## 2018-12-31 NOTE — Progress Notes (Signed)
Renewed adderall and clonazepam and sent both to [facility] for her.

## 2019-01-06 ENCOUNTER — Telehealth: Payer: Self-pay

## 2019-01-06 NOTE — Telephone Encounter (Signed)
CONFIRMED 01-08-19 OV AS VIRTUAL.

## 2019-01-08 ENCOUNTER — Other Ambulatory Visit: Payer: Self-pay

## 2019-01-08 ENCOUNTER — Ambulatory Visit: Payer: BC Managed Care – PPO | Admitting: Nurse Practitioner

## 2019-01-08 ENCOUNTER — Encounter: Payer: Self-pay | Admitting: Nurse Practitioner

## 2019-01-08 DIAGNOSIS — R41840 Attention and concentration deficit: Secondary | ICD-10-CM

## 2019-01-08 DIAGNOSIS — M79601 Pain in right arm: Secondary | ICD-10-CM | POA: Diagnosis not present

## 2019-01-08 MED ORDER — AMPHETAMINE-DEXTROAMPHETAMINE 10 MG PO TABS: 10.0000 mg | ORAL_TABLET | Freq: Two times a day (BID) | ORAL | 0 refills | Status: DC

## 2019-01-08 NOTE — Progress Notes (Signed)
[facility] [place], [place] [address]  Internal MEDICINE  Telephone Visit  Patient Name: Tamara Garrett  [id number]  [id number]  Date of Service: 01/17/2019  I connected with the patient at 4:37pm by webcam and verified the patients identity using two identifiers.   I discussed the limitations, risks, security and privacy concerns of performing an evaluation and management service by webcam and the availability of in person appointments. I also discussed with the patient that there may be a patient responsible charge related to the service.  The patient expressed understanding and agrees to proceed.    Chief Complaint  Patient presents with  . Telephone Assessment  . Telephone Screen  . Anxiety    The patient has been contacted via webcam for follow up visit due to concerns for spread of novel coronavirus. She presents for a follow up visit. She states that, yesterday, her right leg gave out on her and she fell. She fell onto her bottom, bracing her fall with her right elbow and arm. She states hat she has increased pain in her hips and lower back from her normal pain level. She has bruising and swelling of her right arm and elbow. She does have pain medication which she can use. This is prescribed per pain management provider.  The patient currently taking adderall 20mg  twice daily if needed for focus and concentration. She states that she is doing well with this dosing and needs to have refills for this.        Current Medication: Outpatient Encounter Medications as of 01/08/2019  Medication Sig  . albuterol (PROVENTIL HFA;VENTOLIN HFA) 108 (90 Base) MCG/ACT inhaler Inhale 2 puffs into the lungs every 4 (four) hours as needed for wheezing.  . benzonatate (TESSALON) 100 MG capsule Take 1 capsule (100 mg total) by mouth 2 (two) times daily as needed for cough.  . clonazePAM (KLONOPIN) 1 MG tablet Take 1 tablet (1 mg total) by mouth daily as needed for  anxiety.  . diphenoxylate-atropine (LOMOTIL) 2.5-0.025 MG tablet Take 1 tablet by mouth 4 (four) times daily as needed for diarrhea or loose stools.  Marland Kitchen estradiol (ESTRACE) 0.1 MG/GM vaginal cream Use pea sized application to the urethra each night at bedtime each night.  . fluconazole (DIFLUCAN) 150 MG tablet Take 1 tablet (150 mg total) by mouth every three (3) days as needed.  . gabapentin (NEURONTIN) 600 MG tablet Take 600 mg by mouth 3 (three) times daily.  . mupirocin ointment (BACTROBAN) 2 % Place 1 application into the nose 2 (two) times daily.  Marland Kitchen omeprazole (PRILOSEC) 20 MG capsule TAKE 1 CAPSULE BY MOUTH EVERY DAY  . oxyCODONE-acetaminophen (PERCOCET/ROXICET) 5-325 MG tablet Take 1 tablet by mouth every 8 (eight) hours as needed for severe pain.  Marland Kitchen PARoxetine (PAXIL) 30 MG tablet TAKE 1 TABLET BY MOUTH EVERY DAY  . phenazopyridine (PYRIDIUM) 200 MG tablet Take 1 tablet (200 mg total) by mouth 3 (three) times daily as needed for pain.  . predniSONE (STERAPRED UNI-PAK 21 TAB) 10 MG (21) TBPK tablet Take as directed  . [DISCONTINUED] amphetamine-dextroamphetamine (ADDERALL) 10 MG tablet Take 1 tablet (10 mg total) by mouth 2 (two) times daily.  . [DISCONTINUED] amphetamine-dextroamphetamine (ADDERALL) 10 MG tablet Take 1 tablet (10 mg total) by mouth 2 (two) times daily.  . [DISCONTINUED] chlorpheniramine-HYDROcodone (TUSSIONEX PENNKINETIC ER) 10-8 MG/5ML SUER Take 5 mLs by mouth every 12 (twelve) hours as needed for cough.  Marland Kitchen amphetamine-dextroamphetamine (ADDERALL) 10 MG tablet Take  1 tablet (10 mg total) by mouth 2 (two) times daily.  . [DISCONTINUED] ciprofloxacin (CIPRO) 500 MG tablet Take 1 tablet (500 mg total) by mouth 2 (two) times daily.  . [DISCONTINUED] levofloxacin (LEVAQUIN) 500 MG tablet Take 1 tablet (500 mg total) by mouth daily.   No facility-administered encounter medications on file as of 01/08/2019.    Surgical History: Past Surgical History:  Procedure Laterality  Date  . ABDOMINAL HYSTERECTOMY    . BREAST BIOPSY Left 2005   benign  . COLONOSCOPY    . COLONOSCOPY WITH PROPOFOL N/A 04/02/2016   Procedure: COLONOSCOPY WITH PROPOFOL;  Surgeon: Lollie Sails, MD;  Location: [facility] ENDOSCOPY;  Service: Endoscopy;  Laterality: N/A;  . KIDNEY STONE SURGERY Right   . LAPAROSCOPIC GASTRIC BYPASS      Medical History: Past Medical History:  Diagnosis Date  . Anxiety   . Chronic kidney disease    kidney stones  . GERD (gastroesophageal reflux disease)     Family History: Family History  Problem Relation Age of Onset  . Colon polyps Mother   . COPD Mother   . Congestive Heart Failure Mother   . Diabetes Mother   . Dementia Father   . Stroke Father     Social History   Socioeconomic History  . Marital status: Married    Spouse name: Not on file  . Number of children: Not on file  . Years of education: Not on file  . Highest education level: Not on file  Occupational History  . Not on file  Tobacco Use  . Smoking status: Current Every Day Smoker    Packs/day: 1.00    Types: Cigarettes  . Smokeless tobacco: Never Used  Substance and Sexual Activity  . Alcohol use: No  . Drug use: No  . Sexual activity: Not on file  Other Topics Concern  . Not on file  Social History Narrative  . Not on file   Social Determinants of Health   Financial Resource Strain:   . Difficulty of Paying Living Expenses: Not on file  Food Insecurity:   . Worried About Charity fundraiser in the Last Year: Not on file  . Ran Out of Food in the Last Year: Not on file  Transportation Needs:   . Lack of Transportation (Medical): Not on file  . Lack of Transportation (Non-Medical): Not on file  Physical Activity:   . Days of Exercise per Week: Not on file  . Minutes of Exercise per Session: Not on file  Stress:   . Feeling of Stress : Not on file  Social Connections:   . Frequency of Communication with Friends and Family: Not on file  . Frequency of  Social Gatherings with Friends and Family: Not on file  . Attends Religious Services: Not on file  . Active Member of Clubs or Organizations: Not on file  . Attends Archivist Meetings: Not on file  . Marital Status: Not on file  Intimate Partner Violence:   . Fear of Current or Ex-Partner: Not on file  . Emotionally Abused: Not on file  . Physically Abused: Not on file  . Sexually Abused: Not on file      Review of Systems  Constitutional: Positive for fatigue. Negative for chills and unexpected weight change.  HENT: Negative for congestion, postnasal drip, rhinorrhea, sneezing and sore throat.   Respiratory: Negative for cough, chest tightness, shortness of breath and wheezing.   Cardiovascular: Negative for chest pain  and palpitations.  Gastrointestinal: Negative for abdominal pain, constipation, diarrhea, nausea and vomiting.  Endocrine: Negative for cold intolerance, heat intolerance, polydipsia and polyuria.  Musculoskeletal: Positive for back pain. Negative for arthralgias, joint swelling and neck pain.       The patient having pain in right arm and right elbow after a fall yesterday. She has bruising and present.   Skin: Negative for rash.  Neurological: Negative for dizziness, tremors, numbness and headaches.  Hematological: Negative for adenopathy. Does not bruise/bleed easily.  Psychiatric/Behavioral: Positive for decreased concentration. Negative for behavioral problems (Depression), sleep disturbance and suicidal ideas. The patient is nervous/anxious.     Vital Signs: There were no vitals taken for this visit.   Observation/Objective:     Assessment/Plan: 1. Pain in right arm Pain increased due to fall yesterday. Is icing her elbow and upper arm. May take pain medication as needed and as prescribed.   2. Attention and concentration deficit May continue to take adderall 10mg  up to twice daily if needed for focus and concentration. Two thirty day  prescriptions were sent to her pharmacy. Dates are 01/29/2019 and 02/27/2019 - amphetamine-dextroamphetamine (ADDERALL) 10 MG tablet; Take 1 tablet (10 mg total) by mouth 2 (two) times daily.  Dispense: 60 tablet; Refill: 0  General Counseling: Joselynn verbalizes understanding of the findings of today's phone visit and agrees with plan of treatment. I have discussed any further diagnostic evaluation that may be needed or ordered today. We also reviewed her medications today. she has been encouraged to call the office with any questions or concerns that should arise related to todays visit.   Refilled Controlled medications today. Reviewed risks and possible side effects associated with taking Stimulants. Combination of these drugs with other psychotropic medications could cause dizziness and drowsiness. Pt needs to Monitor symptoms and exercise caution in driving and operating heavy machinery to avoid damages to oneself, to others and to the surroundings. Patient verbalized understanding in this matter. Dependence and abuse for these drugs will be monitored closely. A Controlled substance policy and procedure is on file which allows [facility] to order a urine drug screen test at any visit. Patient understands and agrees with the plan..  This patient was seen by Leretha Pol FNP Collaboration with Dr Lavera Guise as a part of collaborative care agreement  Meds ordered this encounter  Medications  . DISCONTD: amphetamine-dextroamphetamine (ADDERALL) 10 MG tablet    Sig: Take 1 tablet (10 mg total) by mouth 2 (two) times daily.    Dispense:  60 tablet    Refill:  0    Fill after 01/29/2019    Order Specific Question:   Supervising Provider    Answer:   Lavera Guise [id number]  . amphetamine-dextroamphetamine (ADDERALL) 10 MG tablet    Sig: Take 1 tablet (10 mg total) by mouth 2 (two) times daily.    Dispense:  60 tablet    Refill:  0    Fill after 02/27/2019    Order Specific Question:    Supervising Provider    Answer:   Lavera Guise [id number]    Time spent: 49 Minutes    Dr Lavera Guise Internal medicine

## 2019-01-17 DIAGNOSIS — M79601 Pain in right arm: Secondary | ICD-10-CM | POA: Insufficient documentation

## 2019-01-21 ENCOUNTER — Other Ambulatory Visit: Payer: Self-pay

## 2019-01-21 ENCOUNTER — Ambulatory Visit: Payer: BC Managed Care – PPO | Admitting: Nurse Practitioner

## 2019-01-21 VITALS — Temp 98.6°F

## 2019-01-21 DIAGNOSIS — E559 Vitamin D deficiency, unspecified: Secondary | ICD-10-CM | POA: Diagnosis not present

## 2019-01-21 DIAGNOSIS — R5383 Other fatigue: Secondary | ICD-10-CM

## 2019-01-21 DIAGNOSIS — R55 Syncope and collapse: Secondary | ICD-10-CM | POA: Diagnosis not present

## 2019-01-21 DIAGNOSIS — R42 Dizziness and giddiness: Secondary | ICD-10-CM | POA: Diagnosis not present

## 2019-01-21 DIAGNOSIS — R29818 Other symptoms and signs involving the nervous system: Secondary | ICD-10-CM

## 2019-01-21 DIAGNOSIS — Z20828 Contact with and (suspected) exposure to other viral communicable diseases: Secondary | ICD-10-CM

## 2019-01-21 DIAGNOSIS — R7301 Impaired fasting glucose: Secondary | ICD-10-CM

## 2019-01-21 DIAGNOSIS — Z20822 Contact with and (suspected) exposure to covid-19: Secondary | ICD-10-CM

## 2019-01-21 MED ORDER — MECLIZINE HCL 25 MG PO TABS: 25.0000 mg | ORAL_TABLET | Freq: Three times a day (TID) | ORAL | 1 refills | Status: DC | PRN

## 2019-01-21 NOTE — Progress Notes (Signed)
[facility] [place], [place] [address]  Internal MEDICINE  Telephone Visit  Patient Name: Tamara Garrett  [id number]  [id number]  Date of Service: 01/27/2019  I connected with the patient at 10:45am by webcam and verified the patients identity using two identifiers.   I discussed the limitations, risks, security and privacy concerns of performing an evaluation and management service by webcam and the availability of in person appointments. I also discussed with the patient that there may be a patient responsible charge related to the service.  The patient expressed understanding and agrees to proceed.    Chief Complaint  Patient presents with  . Telephone Assessment  . Telephone Screen  . Dizziness    going on few weeks   . Headache    The patient has been contacted via webcam for follow up visit due to concerns for spread of novel coronavirus. She presents for an acute visit. She states that she keeps experiencing dizzy spells. Started about 2 weeks ago. initially found herself coming forward when she would first get out of bed. Progressed to have random dizzy spells during the day. She thinks this may have caused her to fall prior to her most recent visit. On Tuesday, when she was driving to work, she had dizzy spell. Had to pull off to the side of the road. Waited for 45 minutes before she could get it together to drive herself home. Yesterday, when she went to work, her boss told her that he did not feel comfortable having her come back and forth to work or even being at work, as he is afraid she will have episode and fall while at work. She has not started on any new medications in past two weeks. Diet and fluid intake have not changed. She has never had anything like this happen to her in the past. She denies any nausea, vomiting, or diarrhea. She states that she just never feels well anymore. She has no energy. She feels almost as though she has any desire to  have any energy.       Current Medication: Outpatient Encounter Medications as of 01/21/2019  Medication Sig  . albuterol (PROVENTIL HFA;VENTOLIN HFA) 108 (90 Base) MCG/ACT inhaler Inhale 2 puffs into the lungs every 4 (four) hours as needed for wheezing.  Marland Kitchen amphetamine-dextroamphetamine (ADDERALL) 10 MG tablet Take 1 tablet (10 mg total) by mouth 2 (two) times daily.  . benzonatate (TESSALON) 100 MG capsule Take 1 capsule (100 mg total) by mouth 2 (two) times daily as needed for cough.  . clonazePAM (KLONOPIN) 1 MG tablet Take 1 tablet (1 mg total) by mouth daily as needed for anxiety.  . diphenoxylate-atropine (LOMOTIL) 2.5-0.025 MG tablet Take 1 tablet by mouth 4 (four) times daily as needed for diarrhea or loose stools.  Marland Kitchen estradiol (ESTRACE) 0.1 MG/GM vaginal cream Use pea sized application to the urethra each night at bedtime each night.  . fluconazole (DIFLUCAN) 150 MG tablet Take 1 tablet (150 mg total) by mouth every three (3) days as needed.  . gabapentin (NEURONTIN) 600 MG tablet Take 600 mg by mouth 3 (three) times daily.  . mupirocin ointment (BACTROBAN) 2 % Place 1 application into the nose 2 (two) times daily.  Marland Kitchen omeprazole (PRILOSEC) 20 MG capsule TAKE 1 CAPSULE BY MOUTH EVERY DAY  . oxyCODONE-acetaminophen (PERCOCET/ROXICET) 5-325 MG tablet Take 1 tablet by mouth every 8 (eight) hours as needed for severe pain.  Marland Kitchen PARoxetine (PAXIL) 30 MG tablet  TAKE 1 TABLET BY MOUTH EVERY DAY  . phenazopyridine (PYRIDIUM) 200 MG tablet Take 1 tablet (200 mg total) by mouth 3 (three) times daily as needed for pain.  . predniSONE (STERAPRED UNI-PAK 21 TAB) 10 MG (21) TBPK tablet Take as directed  . meclizine (ANTIVERT) 25 MG tablet Take 1 tablet (25 mg total) by mouth 3 (three) times daily as needed for dizziness.   No facility-administered encounter medications on file as of 01/21/2019.    Surgical History: Past Surgical History:  Procedure Laterality Date  . ABDOMINAL HYSTERECTOMY     . BREAST BIOPSY Left 2005   benign  . COLONOSCOPY    . COLONOSCOPY WITH PROPOFOL N/A 04/02/2016   Procedure: COLONOSCOPY WITH PROPOFOL;  Surgeon: Lollie Sails, MD;  Location: [facility] ENDOSCOPY;  Service: Endoscopy;  Laterality: N/A;  . KIDNEY STONE SURGERY Right   . LAPAROSCOPIC GASTRIC BYPASS      Medical History: Past Medical History:  Diagnosis Date  . Anxiety   . Chronic kidney disease    kidney stones  . GERD (gastroesophageal reflux disease)     Family History: Family History  Problem Relation Age of Onset  . Colon polyps Mother   . COPD Mother   . Congestive Heart Failure Mother   . Diabetes Mother   . Dementia Father   . Stroke Father     Social History   Socioeconomic History  . Marital status: Married    Spouse name: Not on file  . Number of children: Not on file  . Years of education: Not on file  . Highest education level: Not on file  Occupational History  . Not on file  Tobacco Use  . Smoking status: Current Every Day Smoker    Packs/day: 1.00    Types: Cigarettes  . Smokeless tobacco: Never Used  Substance and Sexual Activity  . Alcohol use: No  . Drug use: No  . Sexual activity: Not on file  Other Topics Concern  . Not on file  Social History Narrative  . Not on file   Social Determinants of Health   Financial Resource Strain:   . Difficulty of Paying Living Expenses: Not on file  Food Insecurity:   . Worried About Charity fundraiser in the Last Year: Not on file  . Ran Out of Food in the Last Year: Not on file  Transportation Needs:   . Lack of Transportation (Medical): Not on file  . Lack of Transportation (Non-Medical): Not on file  Physical Activity:   . Days of Exercise per Week: Not on file  . Minutes of Exercise per Session: Not on file  Stress:   . Feeling of Stress : Not on file  Social Connections:   . Frequency of Communication with Friends and Family: Not on file  . Frequency of Social Gatherings with Friends and  Family: Not on file  . Attends Religious Services: Not on file  . Active Member of Clubs or Organizations: Not on file  . Attends Archivist Meetings: Not on file  . Marital Status: Not on file  Intimate Partner Violence:   . Fear of Current or Ex-Partner: Not on file  . Emotionally Abused: Not on file  . Physically Abused: Not on file  . Sexually Abused: Not on file      Review of Systems  Constitutional: Positive for activity change and fatigue. Negative for chills, fever and unexpected weight change.  HENT: Negative for congestion, postnasal drip, rhinorrhea,  sneezing and sore throat.   Respiratory: Negative for cough, chest tightness, shortness of breath and wheezing.   Cardiovascular: Negative for chest pain and palpitations.  Gastrointestinal: Negative for abdominal pain, constipation, diarrhea, nausea and vomiting.  Musculoskeletal: Positive for arthralgias and myalgias. Negative for back pain, joint swelling and neck pain.  Skin: Negative for rash.  Neurological: Positive for dizziness and headaches. Negative for tremors and numbness.       Loss of balance causing falls. Gets dizzy, then feels like she is going to pass out. She loses balance.   Hematological: Negative for adenopathy. Does not bruise/bleed easily.  Psychiatric/Behavioral: Negative for behavioral problems (Depression), sleep disturbance and suicidal ideas. The patient is not nervous/anxious.    Today's Vitals   01/21/19 1003  Temp: 98.6 F (37 C)   There is no height or weight on file to calculate BMI.  Observation/Objective:   The patient is alert and oriented. She is pleasant and answers all questions appropriately. Breathing is non-labored. She is in no acute distress at this time. The patient looks like she does not feel well and is worried.    Assessment/Plan:  1. Syncope and collapse Will get lab including anemia and tyroid panels. Will get echocardiogram for further evaluation.  - CBC  With Differential; Future - Comprehensive metabolic panel; Future - Iron, TIBC and Ferritin Panel; Future - Vitamin B12; Future - ECHOCARDIOGRAM COMPLETE; Future - Vitamin B12 - Iron, TIBC and Ferritin Panel - Comprehensive metabolic panel - CBC With Differential  2. Other fatigue Check labs - TSH + free T4; Future - Iron, TIBC and Ferritin Panel; Future - Vitamin B12; Future - Vitamin B12 - Iron, TIBC and Ferritin Panel - TSH + free T4  3. Vertigo Add meclizine 25mg  which may be taken up to three times daily as needed for dizziness/vertigo.  - meclizine (ANTIVERT) 25 MG tablet; Take 1 tablet (25 mg total) by mouth 3 (three) times daily as needed for dizziness.  Dispense: 60 tablet; Refill: 1  4. Vitamin D deficiency Check vitamin d level.  - Vitamin D 1,25 dihydroxy; Future - Vitamin D 1,25 dihydroxy  5. Impaired fasting glucose Check HgbA1c level. - Hemoglobin A1c; Future - Hemoglobin A1c  6. Other symptoms and signs involving the nervous system Will get CT head for further evaluation.  - CT Head Wo Contrast; Future  7. Exposure to COVID-19 virus Check COVID 19 test.  - Novel Coronavirus, NAA ([facility])  General Counseling: Amirah verbalizes understanding of the findings of today's phone visit and agrees with plan of treatment. I have discussed any further diagnostic evaluation that may be needed or ordered today. We also reviewed her medications today. she has been encouraged to call the office with any questions or concerns that should arise related to todays visit.  This patient was seen by Leretha Pol FNP Collaboration with Dr Lavera Guise as a part of collaborative care agreement  Orders Placed This Encounter  Procedures  . CT Head Wo Contrast  . CBC With Differential  . Comprehensive metabolic panel  . TSH + free T4  . Iron, TIBC and Ferritin Panel  . Vitamin B12  . Vitamin D 1,25 dihydroxy  . Hemoglobin A1c  . ECHOCARDIOGRAM COMPLETE    Meds ordered  this encounter  Medications  . meclizine (ANTIVERT) 25 MG tablet    Sig: Take 1 tablet (25 mg total) by mouth 3 (three) times daily as needed for dizziness.    Dispense:  60 tablet  Refill:  1    Order Specific Question:   Supervising Provider    Answer:   Lavera Guise [id number]    Time spent: 57 Minutes    Dr Lavera Guise Internal medicine

## 2019-01-23 NOTE — Progress Notes (Signed)
Vitamin b12 deficiency. Start weekly vitamin b12 injections for 4 weeks and then monthly after that.

## 2019-01-25 ENCOUNTER — Telehealth: Payer: Self-pay

## 2019-01-25 NOTE — Telephone Encounter (Signed)
-----   Message from Ronnell Freshwater, NP sent at 01/25/2019  3:00 PM EST ----- Hey. This goes with the previous message. We can start weekly B12 injections anytime. This is weekly for four weeks then monthly after that. Will wait to see CT scan and echo results. Thanks.

## 2019-01-25 NOTE — Telephone Encounter (Signed)
Pt was notified and scheduled b12 injection this Thursday.

## 2019-01-25 NOTE — Telephone Encounter (Signed)
Pt was notified. I told her I will call her tomorrow to check up on her and to monitor her bp throughout the day.

## 2019-01-25 NOTE — Progress Notes (Signed)
Hey. This goes with the previous message. We can start weekly B12 injections anytime. This is weekly for four weeks then monthly after that. Will wait to see CT scan and echo results. Thanks.

## 2019-01-25 NOTE — Telephone Encounter (Signed)
She might want to hold any oxycodone or clonazepam, as these can lower her blood pressure more. She should also increase water intake. Thus far, she does have low b12 levels, however, I am waiting on some other test results to come back.

## 2019-01-27 ENCOUNTER — Telehealth: Payer: Self-pay

## 2019-01-27 ENCOUNTER — Other Ambulatory Visit: Payer: Self-pay

## 2019-01-27 ENCOUNTER — Ambulatory Visit
Admission: RE | Admit: 2019-01-27 | Discharge: 2019-01-27 | Disposition: A | Discharge status: Home / Self Care | Payer: BC Managed Care – PPO | Source: Ambulatory Visit | Attending: Nurse Practitioner | Admitting: Nurse Practitioner

## 2019-01-27 ENCOUNTER — Encounter: Payer: Self-pay | Admitting: Nurse Practitioner

## 2019-01-27 DIAGNOSIS — R29818 Other symptoms and signs involving the nervous system: Secondary | ICD-10-CM | POA: Insufficient documentation

## 2019-01-27 DIAGNOSIS — R55 Syncope and collapse: Secondary | ICD-10-CM | POA: Diagnosis not present

## 2019-01-27 DIAGNOSIS — R7301 Impaired fasting glucose: Secondary | ICD-10-CM | POA: Insufficient documentation

## 2019-01-27 DIAGNOSIS — R42 Dizziness and giddiness: Secondary | ICD-10-CM | POA: Insufficient documentation

## 2019-01-27 DIAGNOSIS — E559 Vitamin D deficiency, unspecified: Secondary | ICD-10-CM | POA: Insufficient documentation

## 2019-01-27 LAB — CBC WITH DIFFERENTIAL
Basophils Absolute: 0 10*3/uL (ref 0.0–0.2)
Basos: 0 %
EOS (ABSOLUTE): 0.2 10*3/uL (ref 0.0–0.4)
Eos: 2 %
Hematocrit: 40.6 % (ref 34.0–46.6)
Hemoglobin: 13.5 g/dL (ref 11.1–15.9)
Immature Grans (Abs): 0 10*3/uL (ref 0.0–0.1)
Immature Granulocytes: 0 %
Lymphocytes Absolute: 3.4 10*3/uL — ABNORMAL HIGH (ref 0.7–3.1)
Lymphs: 35 %
MCH: 30.2 pg (ref 26.6–33.0)
MCHC: 33.3 g/dL (ref 31.5–35.7)
MCV: 91 fL (ref 79–97)
Monocytes Absolute: 0.5 10*3/uL (ref 0.1–0.9)
Monocytes: 5 %
Neutrophils Absolute: 5.6 10*3/uL (ref 1.4–7.0)
Neutrophils: 58 %
RBC: 4.47 x10E6/uL (ref 3.77–5.28)
RDW: 12.3 % (ref 11.7–15.4)
WBC: 9.8 10*3/uL (ref 3.4–10.8)

## 2019-01-27 LAB — COMPREHENSIVE METABOLIC PANEL
ALT: 10 IU/L (ref 0–32)
AST: 18 IU/L (ref 0–40)
Albumin/Globulin Ratio: 1.7 (ref 1.2–2.2)
Albumin: 4.3 g/dL (ref 3.8–4.9)
Alkaline Phosphatase: 88 IU/L (ref 39–117)
BUN/Creatinine Ratio: 10 (ref 9–23)
BUN: 11 mg/dL (ref 6–24)
Bilirubin Total: 0.3 mg/dL (ref 0.0–1.2)
CO2: 26 mmol/L (ref 20–29)
Calcium: 9.1 mg/dL (ref 8.7–10.2)
Chloride: 103 mmol/L (ref 96–106)
Creatinine, Ser: 1.07 mg/dL — ABNORMAL HIGH (ref 0.57–1.00)
GFR calc Af Amer: 68 mL/min/{1.73_m2} (ref >59)
GFR calc non Af Amer: 59 mL/min/{1.73_m2} — ABNORMAL LOW (ref >59)
Globulin, Total: 2.5 g/dL (ref 1.5–4.5)
Glucose: 71 mg/dL (ref 65–99)
Potassium: 4.5 mmol/L (ref 3.5–5.2)
Sodium: 142 mmol/L (ref 134–144)
Total Protein: 6.8 g/dL (ref 6.0–8.5)

## 2019-01-27 LAB — VITAMIN D 1,25 DIHYDROXY
Vitamin D 1, 25 (OH)2 Total: 53 pg/mL
Vitamin D2 1, 25 (OH)2: 25 pg/mL
Vitamin D3 1, 25 (OH)2: 28 pg/mL

## 2019-01-27 LAB — IRON,TIBC AND FERRITIN PANEL
Ferritin: 48 ng/mL (ref 15–150)
Iron Saturation: 26 % (ref 15–55)
Iron: 97 ug/dL (ref 27–159)
Total Iron Binding Capacity: 376 ug/dL (ref 250–450)
UIBC: 279 ug/dL (ref 131–425)

## 2019-01-27 LAB — VITAMIN B12: Vitamin B-12: 199 pg/mL — ABNORMAL LOW (ref 232–1245)

## 2019-01-27 LAB — HEMOGLOBIN A1C
Est. average glucose Bld gHb Est-mCnc: 111 mg/dL
Hgb A1c MFr Bld: 5.5 % (ref 4.8–5.6)

## 2019-01-27 LAB — TSH+FREE T4
Free T4: 0.84 ng/dL (ref 0.82–1.77)
TSH: 2.34 u[IU]/mL (ref 0.450–4.500)

## 2019-01-27 NOTE — Telephone Encounter (Signed)
Called pt yesterday and her bp readings and the top numbers have increased to the low 100's

## 2019-01-27 NOTE — Progress Notes (Signed)
Normal head CT

## 2019-01-27 NOTE — Progress Notes (Signed)
Sorry to keep adding on, but can you let her know that her head CT was normal. Thanks.

## 2019-01-27 NOTE — Telephone Encounter (Signed)
-----   Message from Ronnell Freshwater, NP sent at 01/27/2019  2:44 PM EST ----- Sorry to keep adding on, but can you let her know that her head CT was normal. Thanks.

## 2019-01-27 NOTE — Telephone Encounter (Signed)
PT WAS NOTIFIED. 

## 2019-01-28 ENCOUNTER — Ambulatory Visit (INDEPENDENT_AMBULATORY_CARE_PROVIDER_SITE_OTHER): Payer: BC Managed Care – PPO

## 2019-01-28 DIAGNOSIS — E538 Deficiency of other specified B group vitamins: Secondary | ICD-10-CM | POA: Diagnosis not present

## 2019-01-28 MED ORDER — CYANOCOBALAMIN 1000 MCG/ML IJ SOLN
1000.0000 ug | Freq: Once | INTRAMUSCULAR | Status: AC
  Administered 2019-01-28: 1000 ug via INTRAMUSCULAR

## 2019-02-02 ENCOUNTER — Ambulatory Visit: Payer: BC Managed Care – PPO

## 2019-02-03 ENCOUNTER — Ambulatory Visit (INDEPENDENT_AMBULATORY_CARE_PROVIDER_SITE_OTHER): Payer: BC Managed Care – PPO

## 2019-02-03 ENCOUNTER — Other Ambulatory Visit: Payer: Self-pay

## 2019-02-03 DIAGNOSIS — E538 Deficiency of other specified B group vitamins: Secondary | ICD-10-CM

## 2019-02-03 MED ORDER — CYANOCOBALAMIN 1000 MCG/ML IJ SOLN
1000.0000 ug | Freq: Once | INTRAMUSCULAR | Status: AC
  Administered 2019-02-03: 1000 ug via INTRAMUSCULAR

## 2019-02-05 DIAGNOSIS — Z20828 Contact with and (suspected) exposure to other viral communicable diseases: Secondary | ICD-10-CM | POA: Diagnosis not present

## 2019-02-08 ENCOUNTER — Encounter: Payer: Self-pay | Admitting: Nurse Practitioner

## 2019-02-10 ENCOUNTER — Ambulatory Visit: Payer: BC Managed Care – PPO

## 2019-02-12 ENCOUNTER — Other Ambulatory Visit: Payer: BC Managed Care – PPO

## 2019-02-14 ENCOUNTER — Encounter: Payer: Self-pay | Admitting: Nurse Practitioner

## 2019-02-15 NOTE — Telephone Encounter (Signed)
Hey. Can you call her to reschedule her missed appointments? Appointments missed due to covid.

## 2019-02-17 ENCOUNTER — Telehealth: Payer: Self-pay

## 2019-02-17 NOTE — Telephone Encounter (Signed)
Confirmed virtual visit with patient. klh 

## 2019-02-19 ENCOUNTER — Encounter: Payer: Self-pay | Admitting: Nurse Practitioner

## 2019-02-19 ENCOUNTER — Ambulatory Visit: Payer: BC Managed Care – PPO | Admitting: Nurse Practitioner

## 2019-02-19 VITALS — BP 100/57 | HR 72 | Temp 98.3°F | Ht 64.0 in | Wt 165.0 lb

## 2019-02-19 DIAGNOSIS — R42 Dizziness and giddiness: Secondary | ICD-10-CM

## 2019-02-19 DIAGNOSIS — R55 Syncope and collapse: Secondary | ICD-10-CM

## 2019-02-19 DIAGNOSIS — R531 Weakness: Secondary | ICD-10-CM | POA: Diagnosis not present

## 2019-02-19 DIAGNOSIS — R251 Tremor, unspecified: Secondary | ICD-10-CM | POA: Diagnosis not present

## 2019-02-19 DIAGNOSIS — E538 Deficiency of other specified B group vitamins: Secondary | ICD-10-CM

## 2019-02-19 NOTE — Progress Notes (Signed)
[facility] [place], [place] [address]  Internal MEDICINE  Telephone Visit  Patient Name: Tamara Garrett  [id number]  [id number]  Date of Service: 02/21/2019  I connected with the patient at 4:19pm by webcam and verified the patients identity using two identifiers.   I discussed the limitations, risks, security and privacy concerns of performing an evaluation and management service by webcam and the availability of in person appointments. I also discussed with the patient that there may be a patient responsible charge related to the service.  The patient expressed understanding and agrees to proceed.    Chief Complaint  Patient presents with  . Telephone Assessment  . Telephone Screen  . Follow-up    REVIEW CT SCAN     The patient has been contacted via webcam for follow up visit due to concerns for spread of novel coronavirus. The patient presents for routine visit. The patient continues to complain of dizzy spells. Also having trouble with tremor in her legs. Legs still weak and giving out on her. She is also having tremor and weakness in her arms and hands. Symptoms in the arms and legs don't generally occur simultaneously. She states that her husband also sttes that he noted a few episodes of patient "zoning" out. Will be talking to her and she is looking at him, but she is really not conscious. She is not responding to direct questions and does not interact in conversations. She did have labs done when symptoms first started. She does have Vitamin B12 deficiency.       Current Medication: Outpatient Encounter Medications as of 02/19/2019  Medication Sig  . albuterol (PROVENTIL HFA;VENTOLIN HFA) 108 (90 Base) MCG/ACT inhaler Inhale 2 puffs into the lungs every 4 (four) hours as needed for wheezing.  Marland Kitchen amphetamine-dextroamphetamine (ADDERALL) 10 MG tablet Take 1 tablet (10 mg total) by mouth 2 (two) times daily.  . clonazePAM (KLONOPIN) 1 MG tablet Take 1  tablet (1 mg total) by mouth daily as needed for anxiety.  . diphenoxylate-atropine (LOMOTIL) 2.5-0.025 MG tablet Take 1 tablet by mouth 4 (four) times daily as needed for diarrhea or loose stools.  Marland Kitchen estradiol (ESTRACE) 0.1 MG/GM vaginal cream Use pea sized application to the urethra each night at bedtime each night.  . fluconazole (DIFLUCAN) 150 MG tablet Take 1 tablet (150 mg total) by mouth every three (3) days as needed.  . gabapentin (NEURONTIN) 600 MG tablet Take 600 mg by mouth 3 (three) times daily.  . meclizine (ANTIVERT) 25 MG tablet Take 1 tablet (25 mg total) by mouth 3 (three) times daily as needed for dizziness.  . mupirocin ointment (BACTROBAN) 2 % Place 1 application into the nose 2 (two) times daily.  Marland Kitchen omeprazole (PRILOSEC) 20 MG capsule TAKE 1 CAPSULE BY MOUTH EVERY DAY  . oxyCODONE-acetaminophen (PERCOCET/ROXICET) 5-325 MG tablet Take 1 tablet by mouth every 8 (eight) hours as needed for severe pain.  Marland Kitchen PARoxetine (PAXIL) 30 MG tablet TAKE 1 TABLET BY MOUTH EVERY DAY  . phenazopyridine (PYRIDIUM) 200 MG tablet Take 1 tablet (200 mg total) by mouth 3 (three) times daily as needed for pain.  . [DISCONTINUED] benzonatate (TESSALON) 100 MG capsule Take 1 capsule (100 mg total) by mouth 2 (two) times daily as needed for cough. (Patient not taking: Reported on 02/19/2019)  . [DISCONTINUED] predniSONE (STERAPRED UNI-PAK 21 TAB) 10 MG (21) TBPK tablet Take as directed (Patient not taking: Reported on 02/19/2019)   No facility-administered encounter medications on  file as of 02/19/2019.    Surgical History: Past Surgical History:  Procedure Laterality Date  . ABDOMINAL HYSTERECTOMY    . BREAST BIOPSY Left 2005   benign  . COLONOSCOPY    . COLONOSCOPY WITH PROPOFOL N/A 04/02/2016   Procedure: COLONOSCOPY WITH PROPOFOL;  Surgeon: Lollie Sails, MD;  Location: [facility] ENDOSCOPY;  Service: Endoscopy;  Laterality: N/A;  . KIDNEY STONE SURGERY Right   . LAPAROSCOPIC GASTRIC BYPASS       Medical History: Past Medical History:  Diagnosis Date  . Anxiety   . Chronic kidney disease    kidney stones  . GERD (gastroesophageal reflux disease)     Family History: Family History  Problem Relation Age of Onset  . Colon polyps Mother   . COPD Mother   . Congestive Heart Failure Mother   . Diabetes Mother   . Dementia Father   . Stroke Father     Social History   Socioeconomic History  . Marital status: Married    Spouse name: Not on file  . Number of children: Not on file  . Years of education: Not on file  . Highest education level: Not on file  Occupational History  . Not on file  Tobacco Use  . Smoking status: Current Every Day Smoker    Packs/day: 1.00    Types: Cigarettes  . Smokeless tobacco: Never Used  Substance and Sexual Activity  . Alcohol use: No  . Drug use: No  . Sexual activity: Not on file  Other Topics Concern  . Not on file  Social History Narrative  . Not on file   Social Determinants of Health   Financial Resource Strain:   . Difficulty of Paying Living Expenses: Not on file  Food Insecurity:   . Worried About Charity fundraiser in the Last Year: Not on file  . Ran Out of Food in the Last Year: Not on file  Transportation Needs:   . Lack of Transportation (Medical): Not on file  . Lack of Transportation (Non-Medical): Not on file  Physical Activity:   . Days of Exercise per Week: Not on file  . Minutes of Exercise per Session: Not on file  Stress:   . Feeling of Stress : Not on file  Social Connections:   . Frequency of Communication with Friends and Family: Not on file  . Frequency of Social Gatherings with Friends and Family: Not on file  . Attends Religious Services: Not on file  . Active Member of Clubs or Organizations: Not on file  . Attends Archivist Meetings: Not on file  . Marital Status: Not on file  Intimate Partner Violence:   . Fear of Current or Ex-Partner: Not on file  . Emotionally Abused:  Not on file  . Physically Abused: Not on file  . Sexually Abused: Not on file      Review of Systems  Constitutional: Positive for activity change and fatigue. Negative for chills, fever and unexpected weight change.  HENT: Negative for congestion, postnasal drip, rhinorrhea, sneezing and sore throat.   Respiratory: Negative for cough, chest tightness, shortness of breath and wheezing.   Cardiovascular: Negative for chest pain and palpitations.  Gastrointestinal: Negative for abdominal pain, constipation, diarrhea, nausea and vomiting.  Endocrine: Negative for cold intolerance, heat intolerance, polydipsia and polyuria.  Musculoskeletal: Positive for arthralgias and myalgias. Negative for back pain, joint swelling and neck pain.  Skin: Negative for rash.  Allergic/Immunologic: Negative for environmental allergies.  Neurological: Positive for dizziness, tremors, weakness and headaches. Negative for numbness.       Loss of balance causing falls. Gets dizzy, then feels like she is going to pass out. She loses balance. Symptoms have worsened since her most recent visit.   Hematological: Negative for adenopathy. Does not bruise/bleed easily.  Psychiatric/Behavioral: Negative for behavioral problems (Depression), sleep disturbance and suicidal ideas. The patient is nervous/anxious.     Today's Vitals   02/19/19 1540  BP: (!) 100/57  Pulse: 72  Temp: 98.3 F (36.8 C)  Weight: 165 lb (74.8 kg)  Height: 5\' 4"  (1.626 m)   Body mass index is 28.32 kg/m.  Observation/Objective:   The patient is alert and oriented. She is pleasant and answers all questions appropriately. Breathing is non-labored. She is in no acute distress at this time.    Assessment/Plan:  1. Vertigo Unclear etiology. Negative CT scan of the brain. Refer to neurology for further evaluation.  - Ambulatory referral to Neurology  2. Tremor Unclear etiology. Negative CT scan of the brain. Refer to neurology for  further evaluation.  - Ambulatory referral to Neurology  3. Weakness Unclear etiology. Negative CT scan of the brain. Refer to neurology for further evaluation.  - Ambulatory referral to Neurology  4. Syncope and collapse Will have patient's echocardiogram rescheduled.   5. B12 deficiency Reschedule appointments for b12 injections.   General Counseling: Darya verbalizes understanding of the findings of today's phone visit and agrees with plan of treatment. I have discussed any further diagnostic evaluation that may be needed or ordered today. We also reviewed her medications today. she has been encouraged to call the office with any questions or concerns that should arise related to todays visit.  This patient was seen by Leretha Pol FNP Collaboration with Dr Lavera Guise as a part of collaborative care agreement  Orders Placed This Encounter  Procedures  . Ambulatory referral to Neurology     Time spent: 25 Minutes  Time spent with patient included reviewing progress notes, labs, imaging studies, and discussing plan for follow up.   Dr Lavera Guise Internal medicine

## 2019-02-21 DIAGNOSIS — R531 Weakness: Secondary | ICD-10-CM | POA: Insufficient documentation

## 2019-02-21 DIAGNOSIS — R251 Tremor, unspecified: Secondary | ICD-10-CM | POA: Insufficient documentation

## 2019-02-21 DIAGNOSIS — E538 Deficiency of other specified B group vitamins: Secondary | ICD-10-CM | POA: Insufficient documentation

## 2019-03-02 ENCOUNTER — Other Ambulatory Visit: Payer: Self-pay

## 2019-03-02 DIAGNOSIS — K219 Gastro-esophageal reflux disease without esophagitis: Secondary | ICD-10-CM

## 2019-03-02 MED ORDER — OMEPRAZOLE 20 MG PO CPDR: DELAYED_RELEASE_CAPSULE | ORAL | 1 refills | Status: DC

## 2019-03-04 ENCOUNTER — Encounter: Payer: Self-pay | Admitting: Nurse Practitioner

## 2019-03-04 ENCOUNTER — Other Ambulatory Visit: Payer: Self-pay | Admitting: Nurse Practitioner

## 2019-03-04 DIAGNOSIS — R41840 Attention and concentration deficit: Secondary | ICD-10-CM

## 2019-03-04 NOTE — Telephone Encounter (Signed)
Pt needs a refill.

## 2019-03-08 MED ORDER — AMPHETAMINE-DEXTROAMPHETAMINE 10 MG PO TABS: 10.0000 mg | ORAL_TABLET | Freq: Two times a day (BID) | ORAL | 0 refills | Status: DC

## 2019-04-07 ENCOUNTER — Encounter: Payer: Self-pay | Admitting: Nurse Practitioner

## 2019-04-07 ENCOUNTER — Telehealth: Payer: Self-pay

## 2019-04-07 NOTE — Telephone Encounter (Signed)
Lmom to confirm and screen for 04-09-19 ov.

## 2019-04-07 NOTE — Telephone Encounter (Signed)
Confirmed and screened for 04-09-19 ov.

## 2019-04-09 ENCOUNTER — Ambulatory Visit: Payer: Managed Care, Other (non HMO) | Admitting: Nurse Practitioner

## 2019-04-09 ENCOUNTER — Encounter: Payer: Self-pay | Admitting: Nurse Practitioner

## 2019-04-09 ENCOUNTER — Other Ambulatory Visit: Payer: Self-pay

## 2019-04-09 VITALS — BP 138/77 | HR 98 | Temp 97.3°F | Resp 16 | Ht 64.0 in | Wt 175.0 lb

## 2019-04-09 DIAGNOSIS — R531 Weakness: Secondary | ICD-10-CM

## 2019-04-09 DIAGNOSIS — Z79899 Other long term (current) drug therapy: Secondary | ICD-10-CM

## 2019-04-09 DIAGNOSIS — R251 Tremor, unspecified: Secondary | ICD-10-CM

## 2019-04-09 DIAGNOSIS — R41840 Attention and concentration deficit: Secondary | ICD-10-CM | POA: Diagnosis not present

## 2019-04-09 DIAGNOSIS — K219 Gastro-esophageal reflux disease without esophagitis: Secondary | ICD-10-CM | POA: Diagnosis not present

## 2019-04-09 LAB — POCT URINE DRUG SCREEN
POC Amphetamine UR: POSITIVE — AB
POC BENZODIAZEPINES UR: NOT DETECTED
POC Barbiturate UR: NOT DETECTED
POC Cocaine UR: NOT DETECTED
POC Ecstasy UR: NOT DETECTED
POC Marijuana UR: NOT DETECTED
POC Methadone UR: NOT DETECTED
POC Methamphetamine UR: NOT DETECTED
POC Opiate Ur: POSITIVE — AB
POC Oxycodone UR: POSITIVE — AB
POC PHENCYCLIDINE UR: NOT DETECTED
POC TRICYCLICS UR: POSITIVE — AB

## 2019-04-09 MED ORDER — AMPHETAMINE-DEXTROAMPHETAMINE 10 MG PO TABS: 10.0000 mg | ORAL_TABLET | Freq: Two times a day (BID) | ORAL | 0 refills | Status: DC

## 2019-04-09 MED ORDER — OMEPRAZOLE 20 MG PO CPDR: DELAYED_RELEASE_CAPSULE | ORAL | 1 refills | Status: DC

## 2019-04-09 NOTE — Progress Notes (Signed)
[facility] [place], [place] [address]  Internal MEDICINE  Office Visit Note  Patient Name: Tamara Garrett  [id number]  [id number]  Date of Service: 04/18/2019  Chief Complaint  Patient presents with  . ADD  . Gastroesophageal Reflux  . Quality Metric Gaps    mammogram and pap smear   . Memory Loss    The patient is here for follow up visit. The patient continues to complain of dizzy spells. Also having trouble with tremor in her legs. Legs still weak and giving out on her. She is also having tremor and weakness in her arms and hands. She is now seeing neurologist. In the process of medication adjustments to see if that helps to improve symptoms. Patient states that she has noted a little improvement, but changes are going slowly. She will continue on current dose of adderall. She takes 10mg  twice daily. She needs to have refills for this medication.       Current Medication: Outpatient Encounter Medications as of 04/09/2019  Medication Sig  . amphetamine-dextroamphetamine (ADDERALL) 10 MG tablet Take 1 tablet (10 mg total) by mouth 2 (two) times daily.  . clonazePAM (KLONOPIN) 0.25 MG disintegrating tablet 0.25 mg. Take 1 tab by mouth twice a day. May increase to 3 times daily if needed after 1 week  . diphenoxylate-atropine (LOMOTIL) 2.5-0.025 MG tablet Take 1 tablet by mouth 4 (four) times daily as needed for diarrhea or loose stools.  . fluconazole (DIFLUCAN) 150 MG tablet Take 1 tablet (150 mg total) by mouth every three (3) days as needed.  . gabapentin (NEURONTIN) 600 MG tablet 600 mg. Take 1 tab 3-4 times daily.  . nortriptyline (PAMELOR) 10 MG capsule 10 mg. Take 2 caps nightly.  Marland Kitchen omeprazole (PRILOSEC) 20 MG capsule TAKE 1 CAPSULE BY MOUTH EVERY DAY  . oxyCODONE-acetaminophen (PERCOCET/ROXICET) 5-325 MG tablet Take 1 tablet by mouth every 8 (eight) hours as needed for severe pain.  . phenazopyridine (PYRIDIUM) 200 MG tablet Take 1 tablet (200  mg total) by mouth 3 (three) times daily as needed for pain.  Marland Kitchen QUEtiapine (SEROQUEL) 25 MG tablet 25 mg. Take 1-2 tabs at night for sleep.  Marland Kitchen venlafaxine (EFFEXOR) 75 MG tablet Take 75 mg by mouth 2 (two) times daily.  . [DISCONTINUED] amphetamine-dextroamphetamine (ADDERALL) 10 MG tablet Take 1 tablet (10 mg total) by mouth 2 (two) times daily.  . [DISCONTINUED] amphetamine-dextroamphetamine (ADDERALL) 10 MG tablet Take 1 tablet (10 mg total) by mouth 2 (two) times daily.  . [DISCONTINUED] amphetamine-dextroamphetamine (ADDERALL) 10 MG tablet Take 1 tablet (10 mg total) by mouth 2 (two) times daily.  . [DISCONTINUED] omeprazole (PRILOSEC) 20 MG capsule TAKE 1 CAPSULE BY MOUTH EVERY DAY  . [DISCONTINUED] albuterol (PROVENTIL HFA;VENTOLIN HFA) 108 (90 Base) MCG/ACT inhaler Inhale 2 puffs into the lungs every 4 (four) hours as needed for wheezing. (Patient not taking: Reported on 04/09/2019)  . [DISCONTINUED] clonazePAM (KLONOPIN) 1 MG tablet Take 1 tablet (1 mg total) by mouth daily as needed for anxiety. (Patient not taking: Reported on 04/09/2019)  . [DISCONTINUED] estradiol (ESTRACE) 0.1 MG/GM vaginal cream Use pea sized application to the urethra each night at bedtime each night.  . [DISCONTINUED] meclizine (ANTIVERT) 25 MG tablet Take 1 tablet (25 mg total) by mouth 3 (three) times daily as needed for dizziness. (Patient not taking: Reported on 04/09/2019)  . [DISCONTINUED] mupirocin ointment (BACTROBAN) 2 % Place 1 application into the nose 2 (two) times daily. (Patient not taking: Reported  on 04/09/2019)  . [DISCONTINUED] PARoxetine (PAXIL) 30 MG tablet TAKE 1 TABLET BY MOUTH EVERY DAY (Patient not taking: Reported on 04/09/2019)   No facility-administered encounter medications on file as of 04/09/2019.    Surgical History: Past Surgical History:  Procedure Laterality Date  . ABDOMINAL HYSTERECTOMY    . BREAST BIOPSY Left 2005   benign  . COLONOSCOPY    . COLONOSCOPY WITH PROPOFOL N/A  04/02/2016   Procedure: COLONOSCOPY WITH PROPOFOL;  Surgeon: Lollie Sails, MD;  Location: [facility] ENDOSCOPY;  Service: Endoscopy;  Laterality: N/A;  . KIDNEY STONE SURGERY Right   . LAPAROSCOPIC GASTRIC BYPASS      Medical History: Past Medical History:  Diagnosis Date  . Anxiety   . Chronic kidney disease    kidney stones  . GERD (gastroesophageal reflux disease)     Family History: Family History  Problem Relation Age of Onset  . Colon polyps Mother   . COPD Mother   . Congestive Heart Failure Mother   . Diabetes Mother   . Dementia Father   . Stroke Father     Social History   Socioeconomic History  . Marital status: Married    Spouse name: Not on file  . Number of children: Not on file  . Years of education: Not on file  . Highest education level: Not on file  Occupational History  . Not on file  Tobacco Use  . Smoking status: Current Every Day Smoker    Packs/day: 1.00    Types: Cigarettes  . Smokeless tobacco: Never Used  Substance and Sexual Activity  . Alcohol use: No  . Drug use: No  . Sexual activity: Not on file  Other Topics Concern  . Not on file  Social History Narrative  . Not on file   Social Determinants of Health   Financial Resource Strain:   . Difficulty of Paying Living Expenses:   Food Insecurity:   . Worried About Charity fundraiser in the Last Year:   . Arboriculturist in the Last Year:   Transportation Needs:   . Film/video editor (Medical):   Marland Kitchen Lack of Transportation (Non-Medical):   Physical Activity:   . Days of Exercise per Week:   . Minutes of Exercise per Session:   Stress:   . Feeling of Stress :   Social Connections:   . Frequency of Communication with Friends and Family:   . Frequency of Social Gatherings with Friends and Family:   . Attends Religious Services:   . Active Member of Clubs or Organizations:   . Attends Archivist Meetings:   Marland Kitchen Marital Status:   Intimate Partner Violence:   . Fear  of Current or Ex-Partner:   . Emotionally Abused:   Marland Kitchen Physically Abused:   . Sexually Abused:       Review of Systems  Constitutional: Positive for activity change and fatigue. Negative for chills, fever and unexpected weight change.       Patient has been laid off from her job due to current symptoms and absences due to these symptoms.   HENT: Negative for congestion, postnasal drip, rhinorrhea, sneezing and sore throat.   Respiratory: Negative for cough, chest tightness, shortness of breath and wheezing.   Cardiovascular: Negative for chest pain and palpitations.  Gastrointestinal: Negative for abdominal pain, constipation, diarrhea, nausea and vomiting.  Endocrine: Negative for cold intolerance, heat intolerance, polydipsia and polyuria.  Musculoskeletal: Positive for arthralgias and myalgias. Negative  for back pain, joint swelling and neck pain.  Skin: Negative for rash.  Allergic/Immunologic: Negative for environmental allergies.  Neurological: Positive for dizziness, tremors, weakness and headaches. Negative for numbness.       Loss of balance causing falls. Gets dizzy, then feels like she is going to pass out. She loses balance. Symptoms have worsened since her most recent visit.   Hematological: Negative for adenopathy. Does not bruise/bleed easily.  Psychiatric/Behavioral: Positive for decreased concentration. Negative for behavioral problems (Depression), sleep disturbance and suicidal ideas. The patient is nervous/anxious.     Today's Vitals   04/09/19 1512  BP: 138/77  Pulse: 98  Resp: 16  Temp: (!) 97.3 F (36.3 C)  SpO2: 94%  Weight: 175 lb (79.4 kg)  Height: 5\' 4"  (1.626 m)   Body mass index is 30.04 kg/m.  Physical Exam Vitals and nursing note reviewed.  Constitutional:      General: She is not in acute distress.    Appearance: Normal appearance. She is well-developed. She is not diaphoretic.  HENT:     Head: Normocephalic and atraumatic.     Nose: Nose  normal.     Mouth/Throat:     Pharynx: No oropharyngeal exudate.  Eyes:     Pupils: Pupils are equal, round, and reactive to light.  Neck:     Thyroid: No thyromegaly.     Vascular: No JVD.     Trachea: No tracheal deviation.  Cardiovascular:     Rate and Rhythm: Normal rate and regular rhythm.     Heart sounds: Normal heart sounds. No murmur. No friction rub. No gallop.   Pulmonary:     Effort: Pulmonary effort is normal. No respiratory distress.     Breath sounds: Normal breath sounds. No wheezing or rales.  Chest:     Chest wall: No tenderness.  Abdominal:     Palpations: Abdomen is soft.  Musculoskeletal:        General: Normal range of motion.     Cervical back: Normal range of motion and neck supple.  Lymphadenopathy:     Cervical: No cervical adenopathy.  Skin:    General: Skin is warm and dry.  Neurological:     Mental Status: She is alert and oriented to person, place, and time. Mental status is at baseline.     Cranial Nerves: No cranial nerve deficit.  Psychiatric:        Mood and Affect: Mood normal.        Behavior: Behavior normal.        Thought Content: Thought content normal.        Judgment: Judgment normal.    Assessment/Plan: 1. Tremor Mild improvement since most recent visit. Now seeing neurology and they are making changes to her current medications.   2. Weakness Mild improvement since most recent visit. Now seeing neurology and they are making changes to her current medications.   3. Gastroesophageal reflux disease without esophagitis Continue omeprazole as prescribed.  - omeprazole (PRILOSEC) 20 MG capsule; TAKE 1 CAPSULE BY MOUTH EVERY DAY  Dispense: 90 capsule; Refill: 1  4. Attention and concentration deficit May continue to take adderall 10mg  up to twice daily as needed to help with focus and concentration. Three 30 day prescriptions sent to her pharmacy. Dates are 04/09/2019, 05/08/2019, and 06/05/2019 - amphetamine-dextroamphetamine  (ADDERALL) 10 MG tablet; Take 1 tablet (10 mg total) by mouth 2 (two) times daily.  Dispense: 60 tablet; Refill: 0  5. Encounter for long-term (current)  use of medications - POCT Urine Drug Screen appropriately positive for AMP, opiates, and TCA.   General Counseling: Diamantina verbalizes understanding of the findings of todays visit and agrees with plan of treatment. I have discussed any further diagnostic evaluation that may be needed or ordered today. We also reviewed her medications today. she has been encouraged to call the office with any questions or concerns that should arise related to todays visit.  Refilled Controlled medications today. Reviewed risks and possible side effects associated with taking Stimulants. Combination of these drugs with other psychotropic medications could cause dizziness and drowsiness. Pt needs to Monitor symptoms and exercise caution in driving and operating heavy machinery to avoid damages to oneself, to others and to the surroundings. Patient verbalized understanding in this matter. Dependence and abuse for these drugs will be monitored closely. A Controlled substance policy and procedure is on file which allows [facility] to order a urine drug screen test at any visit. Patient understands and agrees with the plan..  This patient was seen by Leretha Pol FNP Collaboration with Dr Lavera Guise as a part of collaborative care agreement  Orders Placed This Encounter  Procedures  . POCT Urine Drug Screen    Meds ordered this encounter  Medications  . DISCONTD: amphetamine-dextroamphetamine (ADDERALL) 10 MG tablet    Sig: Take 1 tablet (10 mg total) by mouth 2 (two) times daily.    Dispense:  60 tablet    Refill:  0    Order Specific Question:   Supervising Provider    Answer:   Lavera Guise [id number]  . omeprazole (PRILOSEC) 20 MG capsule    Sig: TAKE 1 CAPSULE BY MOUTH EVERY DAY    Dispense:  90 capsule    Refill:  1    Order Specific Question:    Supervising Provider    Answer:   Lavera Guise [id number]  . DISCONTD: amphetamine-dextroamphetamine (ADDERALL) 10 MG tablet    Sig: Take 1 tablet (10 mg total) by mouth 2 (two) times daily.    Dispense:  60 tablet    Refill:  0    Fill after 05/08/2019    Order Specific Question:   Supervising Provider    Answer:   Lavera Guise [id number]  . amphetamine-dextroamphetamine (ADDERALL) 10 MG tablet    Sig: Take 1 tablet (10 mg total) by mouth 2 (two) times daily.    Dispense:  60 tablet    Refill:  0    Fill after 06/05/2019    Order Specific Question:   Supervising Provider    Answer:   Lavera Guise [id number]    Total time spent: 30 Minutes   Time spent includes review of chart, medications, test results, and follow up plan with the patient.      Dr Lavera Guise Internal medicine

## 2019-05-26 ENCOUNTER — Other Ambulatory Visit: Payer: Self-pay

## 2019-05-26 ENCOUNTER — Telehealth: Payer: Self-pay

## 2019-05-26 DIAGNOSIS — Z01812 Encounter for preprocedural laboratory examination: Secondary | ICD-10-CM

## 2019-05-26 NOTE — Telephone Encounter (Signed)
Pt called that she went to see neurologist he want her to do some labs before procedure  as per dr Humphrey Rolls order labs and make pt 1 week follow up to review labs

## 2019-05-29 LAB — LIPID PANEL WITH LDL/HDL RATIO
Cholesterol, Total: 217 mg/dL — ABNORMAL HIGH (ref 100–199)
HDL: 36 mg/dL — ABNORMAL LOW (ref >39)
LDL Chol Calc (NIH): 138 mg/dL — ABNORMAL HIGH (ref 0–99)
LDL/HDL Ratio: 3.8 ratio — ABNORMAL HIGH (ref 0.0–3.2)
Triglycerides: 236 mg/dL — ABNORMAL HIGH (ref 0–149)
VLDL Cholesterol Cal: 43 mg/dL — ABNORMAL HIGH (ref 5–40)

## 2019-05-29 LAB — CBC WITH DIFFERENTIAL/PLATELET
Basophils Absolute: 0.1 10*3/uL (ref 0.0–0.2)
Basos: 1 %
EOS (ABSOLUTE): 0.1 10*3/uL (ref 0.0–0.4)
Eos: 2 %
Hematocrit: 39.6 % (ref 34.0–46.6)
Hemoglobin: 13.4 g/dL (ref 11.1–15.9)
Immature Grans (Abs): 0 10*3/uL (ref 0.0–0.1)
Immature Granulocytes: 0 %
Lymphocytes Absolute: 2.1 10*3/uL (ref 0.7–3.1)
Lymphs: 28 %
MCH: 30.7 pg (ref 26.6–33.0)
MCHC: 33.8 g/dL (ref 31.5–35.7)
MCV: 91 fL (ref 79–97)
Monocytes Absolute: 0.3 10*3/uL (ref 0.1–0.9)
Monocytes: 4 %
Neutrophils Absolute: 4.9 10*3/uL (ref 1.4–7.0)
Neutrophils: 65 %
Platelets: 301 10*3/uL (ref 150–450)
RBC: 4.36 x10E6/uL (ref 3.77–5.28)
RDW: 12.4 % (ref 11.7–15.4)
WBC: 7.5 10*3/uL (ref 3.4–10.8)

## 2019-05-29 LAB — B12 AND FOLATE PANEL
Folate: 5.9 ng/mL (ref >3.0)
Vitamin B-12: 968 pg/mL (ref 232–1245)

## 2019-05-29 LAB — BASIC METABOLIC PANEL
BUN/Creatinine Ratio: 9 (ref 9–23)
BUN: 10 mg/dL (ref 6–24)
CO2: 22 mmol/L (ref 20–29)
Calcium: 9.1 mg/dL (ref 8.7–10.2)
Chloride: 102 mmol/L (ref 96–106)
Creatinine, Ser: 1.14 mg/dL — ABNORMAL HIGH (ref 0.57–1.00)
GFR calc Af Amer: 62 mL/min/{1.73_m2} (ref >59)
GFR calc non Af Amer: 54 mL/min/{1.73_m2} — ABNORMAL LOW (ref >59)
Glucose: 79 mg/dL (ref 65–99)
Potassium: 4.8 mmol/L (ref 3.5–5.2)
Sodium: 139 mmol/L (ref 134–144)

## 2019-05-29 LAB — HEMOGLOBIN A1C
Est. average glucose Bld gHb Est-mCnc: 114 mg/dL
Hgb A1c MFr Bld: 5.6 % (ref 4.8–5.6)

## 2019-06-01 ENCOUNTER — Telehealth: Payer: Self-pay

## 2019-06-01 NOTE — Progress Notes (Signed)
Needs antilipid therapy

## 2019-06-01 NOTE — Telephone Encounter (Signed)
Confirmed and screened for 06-03-19 ov.

## 2019-06-03 ENCOUNTER — Other Ambulatory Visit: Payer: Self-pay

## 2019-06-03 ENCOUNTER — Encounter: Payer: Self-pay | Admitting: Nurse Practitioner

## 2019-06-03 ENCOUNTER — Ambulatory Visit: Payer: Managed Care, Other (non HMO) | Admitting: Nurse Practitioner

## 2019-06-03 VITALS — BP 128/67 | HR 84 | Temp 97.3°F | Resp 16 | Ht 64.0 in | Wt 180.0 lb

## 2019-06-03 DIAGNOSIS — R29818 Other symptoms and signs involving the nervous system: Secondary | ICD-10-CM | POA: Diagnosis not present

## 2019-06-03 DIAGNOSIS — R41840 Attention and concentration deficit: Secondary | ICD-10-CM

## 2019-06-03 DIAGNOSIS — E782 Mixed hyperlipidemia: Secondary | ICD-10-CM

## 2019-06-03 DIAGNOSIS — R531 Weakness: Secondary | ICD-10-CM | POA: Diagnosis not present

## 2019-06-03 MED ORDER — ROSUVASTATIN CALCIUM 10 MG PO TABS: 10.0000 mg | ORAL_TABLET | Freq: Every day | ORAL | 3 refills | Status: DC

## 2019-06-03 MED ORDER — AMPHETAMINE-DEXTROAMPHETAMINE 10 MG PO TABS: 10.0000 mg | ORAL_TABLET | Freq: Two times a day (BID) | ORAL | 0 refills | Status: DC

## 2019-06-03 NOTE — Progress Notes (Signed)
[facility] [place], [place] [address]  Internal MEDICINE  Office Visit Note  Patient Name: Tamara Garrett  [id number]  [id number]  Date of Service: 06/09/2019  Chief Complaint  Patient presents with  . ADD  . Gastroesophageal Reflux  . Anxiety    The patient is here for follow up visit. The patient continues to complain of dizzy spells. Also having trouble with tremor in her legs. Legs still weak and giving out on her. She is also having tremor and weakness in her arms and hands. She is now seeing neurologist. In the process of medication adjustments to see if that helps to improve symptoms. Patient states that she has noted a little improvement, but changes are going slowly. She is now seeing a new neurologist in [facility] system. She is scheduled for a great deal of testing in near future. She and her sister have updated any and all medication changes and these are current in her medication list.   She will continue on current dose of adderall. She takes 10mg  twice daily. She needs to have refills for this medication.       Current Medication: Outpatient Encounter Medications as of 06/03/2019  Medication Sig  . amphetamine-dextroamphetamine (ADDERALL) 10 MG tablet Take 1 tablet (10 mg total) by mouth 2 (two) times daily.  . clonazePAM (KLONOPIN) 0.25 MG disintegrating tablet 0.25 mg. Take 1 tab by mouth twice a day. May increase to 3 times daily if needed after 1 week  . Cyanocobalamin (B-12 PO) Take by mouth.  . diphenoxylate-atropine (LOMOTIL) 2.5-0.025 MG tablet Take 1 tablet by mouth 4 (four) times daily as needed for diarrhea or loose stools.  . fluconazole (DIFLUCAN) 150 MG tablet Take 1 tablet (150 mg total) by mouth every three (3) days as needed.  . gabapentin (NEURONTIN) 600 MG tablet 600 mg. Take 1 tab 3-4 times daily.  . nortriptyline (PAMELOR) 10 MG capsule 10 mg. Take 2 caps nightly.  Marland Kitchen omeprazole (PRILOSEC) 20 MG capsule TAKE 1 CAPSULE BY MOUTH  EVERY DAY  . oxyCODONE-acetaminophen (PERCOCET/ROXICET) 5-325 MG tablet Take 1 tablet by mouth every 8 (eight) hours as needed for severe pain.  . phenazopyridine (PYRIDIUM) 200 MG tablet Take 1 tablet (200 mg total) by mouth 3 (three) times daily as needed for pain.  Marland Kitchen QUEtiapine (SEROQUEL) 25 MG tablet 25 mg. Take 1-2 tabs at night for sleep.  Marland Kitchen venlafaxine (EFFEXOR) 75 MG tablet Take 75 mg by mouth 2 (two) times daily.  . [DISCONTINUED] amphetamine-dextroamphetamine (ADDERALL) 10 MG tablet Take 1 tablet (10 mg total) by mouth 2 (two) times daily.  . [DISCONTINUED] amphetamine-dextroamphetamine (ADDERALL) 10 MG tablet Take 1 tablet (10 mg total) by mouth 2 (two) times daily.  . rosuvastatin (CRESTOR) 10 MG tablet Take 1 tablet (10 mg total) by mouth daily.   No facility-administered encounter medications on file as of 06/03/2019.    Surgical History: Past Surgical History:  Procedure Laterality Date  . ABDOMINAL HYSTERECTOMY    . BREAST BIOPSY Left 2005   benign  . COLONOSCOPY    . COLONOSCOPY WITH PROPOFOL N/A 04/02/2016   Procedure: COLONOSCOPY WITH PROPOFOL;  Surgeon: Lollie Sails, MD;  Location: [facility] ENDOSCOPY;  Service: Endoscopy;  Laterality: N/A;  . KIDNEY STONE SURGERY Right   . LAPAROSCOPIC GASTRIC BYPASS      Medical History: Past Medical History:  Diagnosis Date  . Anxiety   . Chronic kidney disease    kidney stones  . GERD (gastroesophageal  reflux disease)     Family History: Family History  Problem Relation Age of Onset  . Colon polyps Mother   . COPD Mother   . Congestive Heart Failure Mother   . Diabetes Mother   . Dementia Father   . Stroke Father     Social History   Socioeconomic History  . Marital status: Married    Spouse name: Not on file  . Number of children: Not on file  . Years of education: Not on file  . Highest education level: Not on file  Occupational History  . Not on file  Tobacco Use  . Smoking status: Current Every Day  Smoker    Packs/day: 1.00    Types: Cigarettes  . Smokeless tobacco: Never Used  Substance and Sexual Activity  . Alcohol use: No  . Drug use: No  . Sexual activity: Not on file  Other Topics Concern  . Not on file  Social History Narrative  . Not on file   Social Determinants of Health   Financial Resource Strain:   . Difficulty of Paying Living Expenses:   Food Insecurity:   . Worried About Charity fundraiser in the Last Year:   . Arboriculturist in the Last Year:   Transportation Needs:   . Film/video editor (Medical):   Marland Kitchen Lack of Transportation (Non-Medical):   Physical Activity:   . Days of Exercise per Week:   . Minutes of Exercise per Session:   Stress:   . Feeling of Stress :   Social Connections:   . Frequency of Communication with Friends and Family:   . Frequency of Social Gatherings with Friends and Family:   . Attends Religious Services:   . Active Member of Clubs or Organizations:   . Attends Archivist Meetings:   Marland Kitchen Marital Status:   Intimate Partner Violence:   . Fear of Current or Ex-Partner:   . Emotionally Abused:   Marland Kitchen Physically Abused:   . Sexually Abused:       Review of Systems  Constitutional: Positive for activity change and fatigue. Negative for chills, fever and unexpected weight change.       Patient has been laid off from her job due to current symptoms and absences due to these symptoms.   HENT: Negative for congestion, postnasal drip, rhinorrhea, sneezing and sore throat.   Respiratory: Negative for cough, chest tightness, shortness of breath and wheezing.   Cardiovascular: Negative for chest pain and palpitations.  Gastrointestinal: Negative for abdominal pain, constipation, diarrhea, nausea and vomiting.  Endocrine: Negative for cold intolerance, heat intolerance, polydipsia and polyuria.  Musculoskeletal: Positive for arthralgias and myalgias. Negative for back pain, joint swelling and neck pain.  Skin: Negative for  rash.  Allergic/Immunologic: Negative for environmental allergies.  Neurological: Positive for dizziness, tremors, weakness and headaches. Negative for numbness.       Loss of balance causing falls. Gets dizzy, then feels like she is going to pass out. She loses balance. Symptoms have worsened since her most recent visit.   Hematological: Negative for adenopathy. Does not bruise/bleed easily.  Psychiatric/Behavioral: Positive for decreased concentration. Negative for behavioral problems (Depression), sleep disturbance and suicidal ideas. The patient is nervous/anxious.     Today's Vitals   06/03/19 1613  BP: 128/67  Pulse: 84  Resp: 16  Temp: (!) 97.3 F (36.3 C)  SpO2: 99%  Weight: 180 lb (81.6 kg)  Height: 5\' 4"  (1.626 m)   Body  mass index is 30.9 kg/m.  Physical Exam Vitals and nursing note reviewed.  Constitutional:      General: She is not in acute distress.    Appearance: Normal appearance. She is well-developed. She is not diaphoretic.  HENT:     Head: Normocephalic and atraumatic.     Nose: Nose normal.     Mouth/Throat:     Pharynx: No oropharyngeal exudate.  Eyes:     Pupils: Pupils are equal, round, and reactive to light.  Neck:     Thyroid: No thyromegaly.     Vascular: No JVD.     Trachea: No tracheal deviation.  Cardiovascular:     Rate and Rhythm: Normal rate and regular rhythm.     Heart sounds: Normal heart sounds. No murmur. No friction rub. No gallop.   Pulmonary:     Effort: Pulmonary effort is normal. No respiratory distress.     Breath sounds: Normal breath sounds. No wheezing or rales.  Chest:     Chest wall: No tenderness.  Abdominal:     Palpations: Abdomen is soft.  Musculoskeletal:        General: Normal range of motion.     Cervical back: Normal range of motion and neck supple.  Lymphadenopathy:     Cervical: No cervical adenopathy.  Skin:    General: Skin is warm and dry.  Neurological:     Mental Status: She is alert and oriented  to person, place, and time. Mental status is at baseline.     Cranial Nerves: No cranial nerve deficit.  Psychiatric:        Mood and Affect: Mood normal.        Behavior: Behavior normal.        Thought Content: Thought content normal.        Judgment: Judgment normal.   Assessment/Plan:  1. Weakness Continue regular visits with neurology as scheduled    2. Other symptoms and signs involving the nervous system Continue regular visits with neurology as scheduled    3. Mixed hyperlipidemia Start crestor 10mg  daily.  - rosuvastatin (CRESTOR) 10 MG tablet; Take 1 tablet (10 mg total) by mouth daily.  Dispense: 30 tablet; Refill: 3  4. Attention and concentration deficit Continue adderall 10mg  up to twice daily sa needed for focus and concentration up to twice daily as needed. Three 30 day prescriptions were sent to her pharmacy. Dates are 06/05/2019, 07/04/2019, and 08/01/2019 - amphetamine-dextroamphetamine (ADDERALL) 10 MG tablet; Take 1 tablet (10 mg total) by mouth 2 (two) times daily.  Dispense: 60 tablet; Refill: 0  General Counseling: Darren verbalizes understanding of the findings of todays visit and agrees with plan of treatment. I have discussed any further diagnostic evaluation that may be needed or ordered today. We also reviewed her medications today. she has been encouraged to call the office with any questions or concerns that should arise related to todays visit.   Refilled Controlled medications today. Reviewed risks and possible side effects associated with taking Stimulants. Combination of these drugs with other psychotropic medications could cause dizziness and drowsiness. Pt needs to Monitor symptoms and exercise caution in driving and operating heavy machinery to avoid damages to oneself, to others and to the surroundings. Patient verbalized understanding in this matter. Dependence and abuse for these drugs will be monitored closely. A Controlled substance policy and  procedure is on file which allows [facility] to order a urine drug screen test at any visit. Patient understands and agrees  with the plan..  This patient was seen by Leretha Pol FNP Collaboration with Dr Lavera Guise as a part of collaborative care agreement  Meds ordered this encounter  Medications  . rosuvastatin (CRESTOR) 10 MG tablet    Sig: Take 1 tablet (10 mg total) by mouth daily.    Dispense:  30 tablet    Refill:  3    Patient has [employer] app to use for this    Order Specific Question:   Supervising Provider    Answer:   Lavera Guise [id number]  . DISCONTD: amphetamine-dextroamphetamine (ADDERALL) 10 MG tablet    Sig: Take 1 tablet (10 mg total) by mouth 2 (two) times daily.    Dispense:  60 tablet    Refill:  0    Fill after 07/04/2019    Order Specific Question:   Supervising Provider    Answer:   Lavera Guise [id number]  . amphetamine-dextroamphetamine (ADDERALL) 10 MG tablet    Sig: Take 1 tablet (10 mg total) by mouth 2 (two) times daily.    Dispense:  60 tablet    Refill:  0    Fill after 08/01/2019    Order Specific Question:   Supervising Provider    Answer:   Lavera Guise [id number]    Total time spent: 30 Minutes   Time spent includes review of chart, medications, test results, and follow up plan with the patient.      Dr Lavera Guise Internal medicine

## 2019-06-09 DIAGNOSIS — E782 Mixed hyperlipidemia: Secondary | ICD-10-CM | POA: Insufficient documentation

## 2019-06-14 NOTE — Progress Notes (Signed)
I hadn't seen these labs yet, but I just started her on crestor at the visit prior to these labs. She is on multiple new meds from neurology. They were afraid meds might be effecting cholesterol panel. Will be rechecking these again in 3 months to see how effective the crestor is.

## 2019-06-15 ENCOUNTER — Telehealth: Payer: Self-pay

## 2019-06-15 NOTE — Telephone Encounter (Signed)
Pt sister called stating that patient had a lumbar puncture and today pt has been running a low grade fever and has not eased with 2,  500 mg tylenol, having low back pain that could be from sleeping in an uncomfortable because it is not around the area that the lumbar puncture was done, and overall pt has been feeling unusually unwell.  Spoke with Nira Conn and called patient back and advised that pt call her neuro provider because these symptoms began post lumbar puncture. Pt understood. Advised pt if there were any concerns to give [place] a call back.

## 2019-06-24 ENCOUNTER — Encounter: Payer: Self-pay | Admitting: Nurse Practitioner

## 2019-06-29 ENCOUNTER — Other Ambulatory Visit: Payer: Self-pay | Admitting: Nurse Practitioner

## 2019-06-29 DIAGNOSIS — F419 Anxiety disorder, unspecified: Secondary | ICD-10-CM

## 2019-06-29 MED ORDER — CLONAZEPAM 0.25 MG PO TBDP: ORAL_TABLET | ORAL | 1 refills | Status: DC

## 2019-06-29 NOTE — Progress Notes (Signed)
Renewed single prescription for clonazepam 0.25mg  two to three time daily as needed and sent to [facility].

## 2019-07-16 ENCOUNTER — Ambulatory Visit: Payer: Self-pay | Admitting: Nurse Practitioner

## 2019-09-02 ENCOUNTER — Telehealth: Payer: Self-pay

## 2019-09-02 NOTE — Telephone Encounter (Signed)
Confirmed and screened for 09-06-19 ov. 

## 2019-09-06 ENCOUNTER — Other Ambulatory Visit: Payer: Self-pay

## 2019-09-06 ENCOUNTER — Ambulatory Visit (INDEPENDENT_AMBULATORY_CARE_PROVIDER_SITE_OTHER): Payer: Managed Care, Other (non HMO) | Admitting: Nurse Practitioner

## 2019-09-06 ENCOUNTER — Encounter: Payer: Self-pay | Admitting: Nurse Practitioner

## 2019-09-06 VITALS — BP 103/63 | HR 83 | Temp 97.1°F | Resp 16 | Ht 64.0 in | Wt 182.0 lb

## 2019-09-06 DIAGNOSIS — E782 Mixed hyperlipidemia: Secondary | ICD-10-CM

## 2019-09-06 DIAGNOSIS — D72829 Elevated white blood cell count, unspecified: Secondary | ICD-10-CM

## 2019-09-06 DIAGNOSIS — R300 Dysuria: Secondary | ICD-10-CM

## 2019-09-06 DIAGNOSIS — I679 Cerebrovascular disease, unspecified: Secondary | ICD-10-CM

## 2019-09-06 DIAGNOSIS — N390 Urinary tract infection, site not specified: Secondary | ICD-10-CM

## 2019-09-06 DIAGNOSIS — R319 Hematuria, unspecified: Secondary | ICD-10-CM

## 2019-09-06 DIAGNOSIS — F321 Major depressive disorder, single episode, moderate: Secondary | ICD-10-CM

## 2019-09-06 DIAGNOSIS — N289 Disorder of kidney and ureter, unspecified: Secondary | ICD-10-CM

## 2019-09-06 LAB — POCT URINALYSIS DIPSTICK
Bilirubin, UA: NEGATIVE
Glucose, UA: NEGATIVE
Ketones, UA: NEGATIVE
Nitrite, UA: POSITIVE
Protein, UA: POSITIVE — AB
Spec Grav, UA: 1.01 (ref 1.010–1.025)
Urobilinogen, UA: 0.2 E.U./dL
pH, UA: 6 (ref 5.0–8.0)

## 2019-09-06 MED ORDER — PHENAZOPYRIDINE HCL 200 MG PO TABS: 200.0000 mg | ORAL_TABLET | Freq: Three times a day (TID) | ORAL | 0 refills | Status: DC | PRN

## 2019-09-06 MED ORDER — CIPROFLOXACIN HCL 500 MG PO TABS: 500.0000 mg | ORAL_TABLET | Freq: Two times a day (BID) | ORAL | 0 refills | Status: DC

## 2019-09-06 MED ORDER — ROSUVASTATIN CALCIUM 20 MG PO TABS: 20.0000 mg | ORAL_TABLET | Freq: Every day | ORAL | 3 refills | Status: DC

## 2019-09-06 NOTE — Progress Notes (Signed)
[facility] [place], [place] [address]  Internal MEDICINE  Office Visit Note  Patient Name: Tamara Garrett  [id number]  [id number]  Date of Service: 09/22/2019  Chief Complaint  Patient presents with  . Follow-up    needs to change cholesterol meds, possible uti  . Gastroesophageal Reflux  . Quality Metric Gaps    mammogram, tetnaus    The patient is here for routine follow up. Today, she is complaining of urinary frequency, urgency, and pain with urination. She does have history of chorinc UTI and renal stones. Has been unable to see her urologist, as she had lost health insurance for some time. Recently got new health insurance.  She is seeing neurologist at [facility]. They continue to manipulate her medications to help with dizziness and tremor. They believe she has had a series of small strokes, causing her to have many of the new symptoms she has been experiencing. MRI of the brain done at [facility] was consistent with chronic white matter microvascular ischemia. There was mild cerebral atrophy present. CT brain, done 09/01/2019 showed no new acute abnormalities of the brain. Per neurology, due to complex situation, may be beneficial for the patient to see psychiatry, both for medication management and for psychotherapy.       Current Medication: Outpatient Encounter Medications as of 09/06/2019  Medication Sig  . amphetamine-dextroamphetamine (ADDERALL) 10 MG tablet Take 1 tablet (10 mg total) by mouth 2 (two) times daily.  . clonazePAM (KLONOPIN) 0.25 MG disintegrating tablet Take 1 tab by mouth twice a day. May increase to 3 times daily if needed after 1 week  . Cyanocobalamin (B-12 PO) Take by mouth.  . diphenoxylate-atropine (LOMOTIL) 2.5-0.025 MG tablet Take 1 tablet by mouth 4 (four) times daily as needed for diarrhea or loose stools.  . fluconazole (DIFLUCAN) 150 MG tablet Take 1 tablet (150 mg total) by mouth every three (3) days as needed.  .  gabapentin (NEURONTIN) 600 MG tablet 600 mg. Take 1 tab 3-4 times daily.  . nortriptyline (PAMELOR) 10 MG capsule 10 mg. Take 2 caps nightly.  Marland Kitchen omeprazole (PRILOSEC) 20 MG capsule TAKE 1 CAPSULE BY MOUTH EVERY DAY  . oxyCODONE-acetaminophen (PERCOCET/ROXICET) 5-325 MG tablet Take 1 tablet by mouth every 8 (eight) hours as needed for severe pain.  . phenazopyridine (PYRIDIUM) 200 MG tablet Take 1 tablet (200 mg total) by mouth 3 (three) times daily as needed for pain.  Marland Kitchen QUEtiapine (SEROQUEL) 25 MG tablet 25 mg. Take 1-2 tabs at night for sleep.  . rosuvastatin (CRESTOR) 20 MG tablet Take 1 tablet (20 mg total) by mouth daily.  Marland Kitchen venlafaxine (EFFEXOR) 75 MG tablet Take 75 mg by mouth 2 (two) times daily.  . [DISCONTINUED] phenazopyridine (PYRIDIUM) 200 MG tablet Take 1 tablet (200 mg total) by mouth 3 (three) times daily as needed for pain.  . [DISCONTINUED] rosuvastatin (CRESTOR) 10 MG tablet Take 1 tablet (10 mg total) by mouth daily.  . ciprofloxacin (CIPRO) 500 MG tablet Take 1 tablet (500 mg total) by mouth 2 (two) times daily.   No facility-administered encounter medications on file as of 09/06/2019.    Surgical History: Past Surgical History:  Procedure Laterality Date  . ABDOMINAL HYSTERECTOMY    . BREAST BIOPSY Left 2005   benign  . COLONOSCOPY    . COLONOSCOPY WITH PROPOFOL N/A 04/02/2016   Procedure: COLONOSCOPY WITH PROPOFOL;  Surgeon: Lollie Sails, MD;  Location: [facility] ENDOSCOPY;  Service: Endoscopy;  Laterality: N/A;  .  KIDNEY STONE SURGERY Right   . LAPAROSCOPIC GASTRIC BYPASS      Medical History: Past Medical History:  Diagnosis Date  . Anxiety   . Chronic kidney disease    kidney stones  . GERD (gastroesophageal reflux disease)     Family History: Family History  Problem Relation Age of Onset  . Colon polyps Mother   . COPD Mother   . Congestive Heart Failure Mother   . Diabetes Mother   . Dementia Father   . Stroke Father     Social History    Socioeconomic History  . Marital status: Married    Spouse name: Not on file  . Number of children: Not on file  . Years of education: Not on file  . Highest education level: Not on file  Occupational History  . Not on file  Tobacco Use  . Smoking status: Current Every Day Smoker    Packs/day: 1.00    Types: Cigarettes  . Smokeless tobacco: Never Used  Vaping Use  . Vaping Use: Former  Substance and Sexual Activity  . Alcohol use: No  . Drug use: No  . Sexual activity: Not on file  Other Topics Concern  . Not on file  Social History Narrative  . Not on file   Social Determinants of Health   Financial Resource Strain:   . Difficulty of Paying Living Expenses: Not on file  Food Insecurity:   . Worried About Charity fundraiser in the Last Year: Not on file  . Ran Out of Food in the Last Year: Not on file  Transportation Needs:   . Lack of Transportation (Medical): Not on file  . Lack of Transportation (Non-Medical): Not on file  Physical Activity:   . Days of Exercise per Week: Not on file  . Minutes of Exercise per Session: Not on file  Stress:   . Feeling of Stress : Not on file  Social Connections:   . Frequency of Communication with Friends and Family: Not on file  . Frequency of Social Gatherings with Friends and Family: Not on file  . Attends Religious Services: Not on file  . Active Member of Clubs or Organizations: Not on file  . Attends Archivist Meetings: Not on file  . Marital Status: Not on file  Intimate Partner Violence:   . Fear of Current or Ex-Partner: Not on file  . Emotionally Abused: Not on file  . Physically Abused: Not on file  . Sexually Abused: Not on file      Review of Systems  Constitutional: Positive for activity change and fatigue. Negative for chills, fever and unexpected weight change.       Patient remains out of work due to development and persistence of neurological symptoms   HENT: Negative for congestion,  postnasal drip, rhinorrhea, sneezing and sore throat.   Respiratory: Negative for cough, chest tightness, shortness of breath and wheezing.   Cardiovascular: Negative for chest pain and palpitations.  Gastrointestinal: Negative for abdominal pain, constipation, diarrhea, nausea and vomiting.  Endocrine: Negative for cold intolerance, heat intolerance, polydipsia and polyuria.  Genitourinary: Positive for dysuria, flank pain, frequency and urgency.  Musculoskeletal: Positive for arthralgias and myalgias. Negative for back pain, joint swelling and neck pain.  Skin: Negative for rash.  Allergic/Immunologic: Negative for environmental allergies.  Neurological: Positive for dizziness, tremors, weakness and headaches. Negative for numbness.       Loss of balance causing falls. Gets dizzy, then feels like she  is going to pass out. She loses balance. Symptoms have worsened since her most recent visit. The patient is now seeing new neurologist at [facility].   Hematological: Negative for adenopathy. Does not bruise/bleed easily.  Psychiatric/Behavioral: Positive for decreased concentration. Negative for behavioral problems (Depression), sleep disturbance and suicidal ideas. The patient is nervous/anxious.     Today's Vitals   09/06/19 1619  BP: 103/63  Pulse: 83  Resp: 16  Temp: (!) 97.1 F (36.2 C)  SpO2: 97%  Weight: 182 lb (82.6 kg)  Height: 5\' 4"  (1.626 m)   Body mass index is 31.24 kg/m.  Physical Exam Vitals and nursing note reviewed.  Constitutional:      General: She is not in acute distress.    Appearance: Normal appearance. She is well-developed. She is not diaphoretic.  HENT:     Head: Normocephalic and atraumatic.     Nose: Nose normal.     Mouth/Throat:     Pharynx: No oropharyngeal exudate.  Eyes:     Pupils: Pupils are equal, round, and reactive to light.  Neck:     Thyroid: No thyromegaly.     Vascular: No JVD.     Trachea: No tracheal deviation.  Cardiovascular:      Rate and Rhythm: Normal rate and regular rhythm.     Heart sounds: Normal heart sounds. No murmur heard.  No friction rub. No gallop.   Pulmonary:     Effort: Pulmonary effort is normal. No respiratory distress.     Breath sounds: Normal breath sounds. No wheezing or rales.  Chest:     Chest wall: No tenderness.  Abdominal:     Palpations: Abdomen is soft.  Genitourinary:    Comments: Urine sample is positive for large blood and moderate WBC. Also positive for nitrites,  Musculoskeletal:        General: Normal range of motion.     Cervical back: Normal range of motion and neck supple.  Lymphadenopathy:     Cervical: No cervical adenopathy.  Skin:    General: Skin is warm and dry.  Neurological:     Mental Status: She is alert and oriented to person, place, and time. Mental status is at baseline.     Cranial Nerves: No cranial nerve deficit.  Psychiatric:        Mood and Affect: Mood normal.        Behavior: Behavior normal.        Thought Content: Thought content normal.        Judgment: Judgment normal.    Assessment/Plan: 1. Urinary tract infection with hematuria, site unspecified Start with cipro 500mg  bid for 10 days. Send urine for culture and sensitivity and adjust antibiotics as indicated.  - ciprofloxacin (CIPRO) 500 MG tablet; Take 1 tablet (500 mg total) by mouth 2 (two) times daily.  Dispense: 20 tablet; Refill: 0  2. Dysuria Pyridium may be taken up to three times daily as needed for bladder pain/spasms.  - POCT Urinalysis Dipstick - CULTURE, URINE COMPREHENSIVE - phenazopyridine (PYRIDIUM) 200 MG tablet; Take 1 tablet (200 mg total) by mouth 3 (three) times daily as needed for pain.  Dispense: 10 tablet; Refill: 0  3. Mixed hyperlipidemia Increase dose rosuvastatin to 20mg  daily. Recheck lipid panel and CMP in three months for further evaluation.  - rosuvastatin (CRESTOR) 20 MG tablet; Take 1 tablet (20 mg total) by mouth daily.  Dispense: 30 tablet;  Refill: 3  4. Cerebrovascular disease Reviewed MRI of the brain  done 04/2019 and [facility] showing chronic white matter microvascular ischemic changes and mild cerebrovascular atrophy. She will continue regular visits with neurology as scheduled.   5. Moderate major depression ([place]) Due to complexity of situation and symptoms, will refer to psychiatry for further evaluation and treatment.  - Ambulatory referral to Psychiatry  6. Abnormal renal function Recheck renal functions prior to next visit.   7. Leukocytosis, unspecified type Start cipro 500mg  bid for UTI. Will recheck CBC prior to next visit.   General Counseling: Jaritza verbalizes understanding of the findings of todays visit and agrees with plan of treatment. I have discussed any further diagnostic evaluation that may be needed or ordered today. We also reviewed her medications today. she has been encouraged to call the office with any questions or concerns that should arise related to todays visit.  This patient was seen by Leretha Pol FNP Collaboration with Dr Lavera Guise as a part of collaborative care agreement  Orders Placed This Encounter  Procedures  . CULTURE, URINE COMPREHENSIVE  . Ambulatory referral to Psychiatry  . POCT Urinalysis Dipstick    Meds ordered this encounter  Medications  . rosuvastatin (CRESTOR) 20 MG tablet    Sig: Take 1 tablet (20 mg total) by mouth daily.    Dispense:  30 tablet    Refill:  3    Patient has [employer] app to use for this    Order Specific Question:   Supervising Provider    Answer:   Lavera Guise [[id number]]  . ciprofloxacin (CIPRO) 500 MG tablet    Sig: Take 1 tablet (500 mg total) by mouth 2 (two) times daily.    Dispense:  20 tablet    Refill:  0    Order Specific Question:   Supervising Provider    Answer:   Lavera Guise [[id number]]  . phenazopyridine (PYRIDIUM) 200 MG tablet    Sig: Take 1 tablet (200 mg total) by mouth 3 (three) times daily as needed for pain.    Dispense:  10  tablet    Refill:  0    Order Specific Question:   Supervising Provider    Answer:   Lavera Guise [[id number]]    Total time spent: 14 Minutes   Time spent includes review of chart, medications, test results, and follow up plan with the patient.      Dr Lavera Guise Internal medicine

## 2019-09-08 NOTE — Progress Notes (Signed)
Started patient on cipro at her last visit

## 2019-09-09 ENCOUNTER — Encounter: Payer: Self-pay | Admitting: Nurse Practitioner

## 2019-09-10 LAB — CULTURE, URINE COMPREHENSIVE

## 2019-09-10 NOTE — Telephone Encounter (Signed)
Can we call to see how is she feeling?

## 2019-09-10 NOTE — Telephone Encounter (Signed)
Spoke with pt she feeling better now she is taking tylenol for fever she said is going down advised her if fever is not going down need to go ED in weekend otherwise call us back Monday

## 2019-09-20 ENCOUNTER — Encounter: Payer: Self-pay | Admitting: Nurse Practitioner

## 2019-09-21 ENCOUNTER — Other Ambulatory Visit: Payer: Self-pay | Admitting: Nurse Practitioner

## 2019-09-21 NOTE — Telephone Encounter (Signed)
Can u please take care of this

## 2019-09-22 ENCOUNTER — Other Ambulatory Visit: Payer: Self-pay | Admitting: Nurse Practitioner

## 2019-09-22 ENCOUNTER — Ambulatory Visit: Payer: Managed Care, Other (non HMO) | Admitting: Internal Medicine

## 2019-09-22 DIAGNOSIS — R41840 Attention and concentration deficit: Secondary | ICD-10-CM

## 2019-09-22 DIAGNOSIS — D72829 Elevated white blood cell count, unspecified: Secondary | ICD-10-CM | POA: Insufficient documentation

## 2019-09-22 DIAGNOSIS — I679 Cerebrovascular disease, unspecified: Secondary | ICD-10-CM | POA: Insufficient documentation

## 2019-09-22 DIAGNOSIS — F321 Major depressive disorder, single episode, moderate: Secondary | ICD-10-CM | POA: Insufficient documentation

## 2019-09-22 DIAGNOSIS — N289 Disorder of kidney and ureter, unspecified: Secondary | ICD-10-CM | POA: Insufficient documentation

## 2019-09-22 LAB — BASIC METABOLIC PANEL
BUN/Creatinine Ratio: 11 (ref 9–23)
BUN: 12 mg/dL (ref 6–24)
CO2: 23 mmol/L (ref 20–29)
Calcium: 9.3 mg/dL (ref 8.7–10.2)
Chloride: 105 mmol/L (ref 96–106)
Creatinine, Ser: 1.1 mg/dL — ABNORMAL HIGH (ref 0.57–1.00)
GFR calc Af Amer: 65 mL/min/{1.73_m2} (ref >59)
GFR calc non Af Amer: 56 mL/min/{1.73_m2} — ABNORMAL LOW (ref >59)
Glucose: 87 mg/dL (ref 65–99)
Potassium: 4.6 mmol/L (ref 3.5–5.2)
Sodium: 142 mmol/L (ref 134–144)

## 2019-09-22 LAB — CBC
Hematocrit: 39.4 % (ref 34.0–46.6)
Hemoglobin: 13.2 g/dL (ref 11.1–15.9)
MCH: 30.3 pg (ref 26.6–33.0)
MCHC: 33.5 g/dL (ref 31.5–35.7)
MCV: 91 fL (ref 79–97)
Platelets: 374 10*3/uL (ref 150–450)
RBC: 4.35 x10E6/uL (ref 3.77–5.28)
RDW: 12.2 % (ref 11.7–15.4)
WBC: 12.6 10*3/uL — ABNORMAL HIGH (ref 3.4–10.8)

## 2019-09-22 NOTE — Telephone Encounter (Signed)
Pt had  appt tomorrow  

## 2019-09-23 ENCOUNTER — Other Ambulatory Visit: Payer: Self-pay

## 2019-09-23 ENCOUNTER — Ambulatory Visit (INDEPENDENT_AMBULATORY_CARE_PROVIDER_SITE_OTHER): Payer: Managed Care, Other (non HMO) | Admitting: Nurse Practitioner

## 2019-09-23 ENCOUNTER — Encounter: Payer: Self-pay | Admitting: Nurse Practitioner

## 2019-09-23 VITALS — BP 90/65 | HR 99 | Temp 97.5°F | Resp 16 | Ht 64.0 in | Wt 178.0 lb

## 2019-09-23 DIAGNOSIS — N390 Urinary tract infection, site not specified: Secondary | ICD-10-CM

## 2019-09-23 DIAGNOSIS — E782 Mixed hyperlipidemia: Secondary | ICD-10-CM | POA: Diagnosis not present

## 2019-09-23 DIAGNOSIS — Z1231 Encounter for screening mammogram for malignant neoplasm of breast: Secondary | ICD-10-CM

## 2019-09-23 DIAGNOSIS — R319 Hematuria, unspecified: Secondary | ICD-10-CM

## 2019-09-23 DIAGNOSIS — I679 Cerebrovascular disease, unspecified: Secondary | ICD-10-CM

## 2019-09-23 DIAGNOSIS — R300 Dysuria: Secondary | ICD-10-CM

## 2019-09-23 DIAGNOSIS — Z79899 Other long term (current) drug therapy: Secondary | ICD-10-CM | POA: Insufficient documentation

## 2019-09-23 DIAGNOSIS — Z0001 Encounter for general adult medical examination with abnormal findings: Secondary | ICD-10-CM

## 2019-09-23 DIAGNOSIS — R41840 Attention and concentration deficit: Secondary | ICD-10-CM

## 2019-09-23 DIAGNOSIS — F321 Major depressive disorder, single episode, moderate: Secondary | ICD-10-CM

## 2019-09-23 MED ORDER — AMPHETAMINE-DEXTROAMPHETAMINE 10 MG PO TABS: 10.0000 mg | ORAL_TABLET | Freq: Two times a day (BID) | ORAL | 0 refills | Status: DC

## 2019-09-23 MED ORDER — NITROFURANTOIN MONOHYD MACRO 100 MG PO CAPS: ORAL_CAPSULE | ORAL | 3 refills | Status: DC

## 2019-09-23 NOTE — Progress Notes (Signed)
[facility] [address], [place] [address]  Internal MEDICINE  Office Visit Note  Patient Name: Tamara Garrett  [id number]  [id number]  Date of Service: 09/23/2019   Pt is here for routine health maintenance examination  Chief Complaint  Patient presents with  . Annual Exam  . Gastroesophageal Reflux  . Quality Metric Gaps    tetnaus, flu vaccine     The patient is here for health maintenance exam. Today, she is having severe flank pain, pain with urination, and frequency of urination. She was recently treated for UTI. She took cipro twice daily for 10 days. She states that symptoms did get better while on antibiotics. Her last antibiotic was this past Friday. She states that since she ended treatment, symptoms have been gradually getting worse.  She is seeing neurologist at [facility]. They continue to manipulate her medications to help with dizziness and tremor. They believe she has had a series of small strokes, causing her to have many of the new symptoms she has been experiencing. MRI of the brain done at [facility] was consistent with chronic white matter microvascular ischemia. There was mild cerebral atrophy present. CT brain, done 09/01/2019 showed no new acute abnormalities of the brain. Per neurology, due to complex situation, may be beneficial for the patient to see psychiatry, both for medication management and for psychotherapy. Until she is able to see psychiatry, she does need to have refills of her adderall. She takes 74m twice daily when needed. She is able to stay on task and focus better when taking adderall. It causes her no negative side effects. Her blood pressure is very well controlled. She did go to lab and have new labs drawn on Tuesday. Results are not yet available. She is due to have screening mamogram.     Current Medication: Outpatient Encounter Medications as of 09/23/2019  Medication Sig  . amphetamine-dextroamphetamine (ADDERALL) 10 MG  tablet Take 1 tablet (10 mg total) by mouth 2 (two) times daily.  . clonazePAM (KLONOPIN) 0.25 MG disintegrating tablet Take 1 tab by mouth twice a day. May increase to 3 times daily if needed after 1 week  . Cyanocobalamin (B-12 PO) Take by mouth.  . diphenoxylate-atropine (LOMOTIL) 2.5-0.025 MG tablet Take 1 tablet by mouth 4 (four) times daily as needed for diarrhea or loose stools.  . fluconazole (DIFLUCAN) 150 MG tablet Take 1 tablet (150 mg total) by mouth every three (3) days as needed.  . gabapentin (NEURONTIN) 600 MG tablet 600 mg. Take 1 tab 3-4 times daily.  . nortriptyline (PAMELOR) 10 MG capsule 10 mg. Take 2 caps nightly.  .Marland Kitchenomeprazole (PRILOSEC) 20 MG capsule TAKE 1 CAPSULE BY MOUTH EVERY DAY  . oxyCODONE-acetaminophen (PERCOCET/ROXICET) 5-325 MG tablet Take 1 tablet by mouth every 8 (eight) hours as needed for severe pain.  . phenazopyridine (PYRIDIUM) 200 MG tablet Take 1 tablet (200 mg total) by mouth 3 (three) times daily as needed for pain.  .Marland KitchenQUEtiapine (SEROQUEL) 25 MG tablet 25 mg. Take 1-2 tabs at night for sleep.  . rosuvastatin (CRESTOR) 20 MG tablet Take 1 tablet (20 mg total) by mouth daily.  .Marland Kitchenvenlafaxine (EFFEXOR) 75 MG tablet Take 75 mg by mouth 2 (two) times daily.  . [DISCONTINUED] amphetamine-dextroamphetamine (ADDERALL) 10 MG tablet Take 1 tablet (10 mg total) by mouth 2 (two) times daily.  . [DISCONTINUED] amphetamine-dextroamphetamine (ADDERALL) 10 MG tablet Take 1 tablet (10 mg total) by mouth 2 (two) times daily.  . [  DISCONTINUED] amphetamine-dextroamphetamine (ADDERALL) 10 MG tablet Take 1 tablet (10 mg total) by mouth 2 (two) times daily.  . [DISCONTINUED] ciprofloxacin (CIPRO) 500 MG tablet Take 1 tablet (500 mg total) by mouth 2 (two) times daily.  . nitrofurantoin, macrocrystal-monohydrate, (MACROBID) 100 MG capsule Take 1 capsule po BID for 10 dys  Then take 1 capsule daily to prevent UTI   No facility-administered encounter medications on file as of  09/23/2019.    Surgical History: Past Surgical History:  Procedure Laterality Date  . ABDOMINAL HYSTERECTOMY    . BREAST BIOPSY Left 2005   benign  . COLONOSCOPY    . COLONOSCOPY WITH PROPOFOL N/A 04/02/2016   Procedure: COLONOSCOPY WITH PROPOFOL;  Surgeon: Lollie Sails, MD;  Location: [facility] ENDOSCOPY;  Service: Endoscopy;  Laterality: N/A;  . KIDNEY STONE SURGERY Right   . LAPAROSCOPIC GASTRIC BYPASS      Medical History: Past Medical History:  Diagnosis Date  . Anxiety   . Chronic kidney disease    kidney stones  . GERD (gastroesophageal reflux disease)     Family History: Family History  Problem Relation Age of Onset  . Colon polyps Mother   . COPD Mother   . Congestive Heart Failure Mother   . Diabetes Mother   . Dementia Father   . Stroke Father       Review of Systems  Constitutional: Positive for activity change and fatigue. Negative for chills, fever and unexpected weight change.       Patient remains out of work due to development and persistence of neurological symptoms   HENT: Negative for congestion, postnasal drip, rhinorrhea, sneezing and sore throat.   Respiratory: Negative for cough, chest tightness, shortness of breath and wheezing.   Cardiovascular: Negative for chest pain and palpitations.  Gastrointestinal: Negative for abdominal pain, constipation, diarrhea, nausea and vomiting.  Endocrine: Negative for cold intolerance, heat intolerance, polydipsia and polyuria.  Genitourinary: Positive for dysuria, flank pain, frequency and urgency.  Musculoskeletal: Positive for arthralgias and myalgias. Negative for back pain, joint swelling and neck pain.  Skin: Negative for rash.  Allergic/Immunologic: Negative for environmental allergies.  Neurological: Positive for dizziness, tremors, weakness and headaches. Negative for numbness.       Loss of balance causing falls. Gets dizzy, then feels like she is going to pass out. She loses balance. Symptoms have  worsened since her most recent visit. The patient is now seeing new neurologist at [facility].   Hematological: Negative for adenopathy. Does not bruise/bleed easily.  Psychiatric/Behavioral: Positive for decreased concentration. Negative for behavioral problems (Depression), sleep disturbance and suicidal ideas. The patient is nervous/anxious.      Today's Vitals   09/23/19 0844  BP: 90/65  Pulse: 99  Resp: 16  Temp: (!) 97.5 F (36.4 C)  SpO2: 99%  Weight: 178 lb (80.7 kg)  Height: '5\' 4"'  (1.626 m)   Body mass index is 30.55 kg/m.  Physical Exam Vitals and nursing note reviewed.  Constitutional:      General: She is not in acute distress.    Appearance: Normal appearance. She is well-developed. She is not diaphoretic.  HENT:     Head: Normocephalic and atraumatic.     Nose: Nose normal.     Mouth/Throat:     Pharynx: No oropharyngeal exudate.  Eyes:     Pupils: Pupils are equal, round, and reactive to light.  Neck:     Thyroid: No thyromegaly.     Vascular: No carotid bruit or JVD.  Trachea: No tracheal deviation.  Cardiovascular:     Rate and Rhythm: Normal rate and regular rhythm.     Pulses: Normal pulses.     Heart sounds: Normal heart sounds. No murmur heard.  No friction rub. No gallop.   Pulmonary:     Effort: Pulmonary effort is normal. No respiratory distress.     Breath sounds: Normal breath sounds. No wheezing or rales.  Chest:     Chest wall: No tenderness.  Abdominal:     General: Bowel sounds are normal.     Palpations: Abdomen is soft.     Tenderness: There is no abdominal tenderness.  Genitourinary:    Comments: Urine sample dark in color and will be sent to the lab for analysis, culture, and sensitivity.  Musculoskeletal:        General: Normal range of motion.     Cervical back: Normal range of motion and neck supple.  Lymphadenopathy:     Cervical: No cervical adenopathy.  Skin:    General: Skin is warm and dry.  Neurological:      Mental Status: She is alert and oriented to person, place, and time. Mental status is at baseline.     Cranial Nerves: No cranial nerve deficit.  Psychiatric:        Mood and Affect: Mood normal.        Behavior: Behavior normal.        Thought Content: Thought content normal.        Judgment: Judgment normal.      LABS: Recent Results (from the past 2160 hour(s))  POCT Urinalysis Dipstick     Status: Abnormal   Collection Time: 09/06/19  4:41 PM  Result Value Ref Range   Color, UA     Clarity, UA     Glucose, UA Negative Negative   Bilirubin, UA negative    Ketones, UA negative    Spec Grav, UA 1.010 1.010 - 1.025   Blood, UA large    pH, UA 6.0 5.0 - 8.0   Protein, UA Positive (A) Negative   Urobilinogen, UA 0.2 0.2 or 1.0 E.U./dL   Nitrite, UA positive    Leukocytes, UA Moderate (2+) (A) Negative   Appearance     Odor    CULTURE, URINE COMPREHENSIVE     Status: Abnormal   Collection Time: 09/06/19  4:52 PM   Specimen: Urine   Urine  Result Value Ref Range   Urine Culture, Comprehensive Final report (A)    Organism ID, Bacteria Escherichia coli (A)     Comment: Cefazolin <=4 ug/mL Cefazolin with an MIC <=16 predicts susceptibility to the oral agents cefaclor, cefdinir, cefpodoxime, cefprozil, cefuroxime, cephalexin, and loracarbef when used for therapy of uncomplicated urinary tract infections due to E. coli, Klebsiella pneumoniae, and Proteus mirabilis. Greater than 100,000 colony forming units per mL    Organism ID, Bacteria Comment     Comment: Mixed urogenital flora 3,000 Colonies/mL    ANTIMICROBIAL SUSCEPTIBILITY Comment     Comment:       ** S = Susceptible; I = Intermediate; R = Resistant **                    P = Positive; N = Negative             MICS are expressed in micrograms per mL    Antibiotic                 RSLT#1  RSLT#2    RSLT#3    RSLT#4 Amoxicillin/Clavulanic Acid    S Ampicillin                     R Cefepime                        S Ceftriaxone                    S Cefuroxime                     S Ciprofloxacin                  S Ertapenem                      S Gentamicin                     S Imipenem                       S Levofloxacin                   S Meropenem                      S Nitrofurantoin                 S Piperacillin/Tazobactam        S Tetracycline                   R Tobramycin                     S Trimethoprim/Sulfa             R   CBC     Status: Abnormal   Collection Time: 09/21/19 11:25 AM  Result Value Ref Range   WBC 12.6 (H) 3.4 - 10.8 x10E3/uL   RBC 4.35 3.77 - 5.28 x10E6/uL   Hemoglobin 13.2 11.1 - 15.9 g/dL   Hematocrit 39.4 34.0 - 46.6 %   MCV 91 79 - 97 fL   MCH 30.3 26.6 - 33.0 pg   MCHC 33.5 31 - 35 g/dL   RDW 12.2 11.7 - 15.4 %   Platelets 374 150 - 450 C78L3/YB  Basic metabolic panel     Status: Abnormal   Collection Time: 09/21/19 11:25 AM  Result Value Ref Range   Glucose 87 65 - 99 mg/dL   BUN 12 6 - 24 mg/dL   Creatinine, Ser 1.10 (H) 0.57 - 1.00 mg/dL   GFR calc non Af Amer 56 (L) >59 mL/min/1.73   GFR calc Af Amer 65 >59 mL/min/1.73    Comment: **[facility] currently reports eGFR in compliance with the current**   recommendations of the [employer]. [facility] will   update reporting as new guidelines are published from the NKF-ASN   Task force.    BUN/Creatinine Ratio 11 9 - 23   Sodium 142 134 - 144 mmol/L   Potassium 4.6 3.5 - 5.2 mmol/L   Chloride 105 96 - 106 mmol/L   CO2 23 20 - 29 mmol/L   Calcium 9.3 8.7 - 10.2 mg/dL    Assessment/Plan: 1. Encounter for general adult medical examination with abnormal findings Annual health maintenance exam today.   2. Urinary tract infection with hematuria, site unspecified Start macrobid  166m. Take twice daily for 10 days. Then, take once daily for next few months to prevent frequent and persistent UTI. Recheck urine sample at next visit.  - nitrofurantoin, macrocrystal-monohydrate, (MACROBID)  100 MG capsule; Take 1 capsule po BID for 10 dys  Then take 1 capsule daily to prevent UTI  Dispense: 40 capsule; Refill: 3 - CULTURE, URINE COMPREHENSIVE  3. Dysuria Start macrobid 1093m Take twice daily for 10 days. Then, take once daily for next few months to prevent frequent and persistent UTI. Recheck urine sample at next visit.  - UA/M w/rflx Culture, Routine - POCT Urinalysis Dipstick  4. Mixed hyperlipidemia Awaiting lab results. Will adjust rosuvastatin dosing as indicated.   5. Cerebrovascular disease Continue regular visits with neurology as scheduled.   6. Attention and concentration deficit For now, will continue adderall 1077mwice daily as needed for focus/concentration. Three 30 day prescriptions sent to her pharmacy. Dates are 09/23/2019, 10/22/2019, and 11/20/2019 - amphetamine-dextroamphetamine (ADDERALL) 10 MG tablet; Take 1 tablet (10 mg total) by mouth 2 (two) times daily.  Dispense: 60 tablet; Refill: 0  7. Moderate major depression (HCCHankinsononitnue medications as prescribed for neurology. Awaiting initial visit with psychiatry for further evaluation and treatment .  8. Encounter for screening mammogram for malignant neoplasm of breast - MM DIGITAL SCREENING BILATERAL; Future  General Counseling: Avenell verbalizes understanding of the findings of todays visit and agrees with plan of treatment. I have discussed any further diagnostic evaluation that may be needed or ordered today. We also reviewed her medications today. she has been encouraged to call the office with any questions or concerns that should arise related to todays visit.    Counseling:  This patient was seen by HeaLeretha PolP Collaboration with Dr FozLavera Guise a part of collaborative care agreement  Orders Placed This Encounter  Procedures  . CULTURE, URINE COMPREHENSIVE  . MM DIGITAL SCREENING BILATERAL  . UA/M w/rflx Culture, Routine  . POCT Urinalysis Dipstick    Meds ordered this  encounter  Medications  . DISCONTD: amphetamine-dextroamphetamine (ADDERALL) 10 MG tablet    Sig: Take 1 tablet (10 mg total) by mouth 2 (two) times daily.    Dispense:  60 tablet    Refill:  0    Order Specific Question:   Supervising Provider    Answer:   KHALavera Guise4[[id number]] nitrofurantoin, macrocrystal-monohydrate, (MACROBID) 100 MG capsule    Sig: Take 1 capsule po BID for 10 dys  Then take 1 capsule daily to prevent UTI    Dispense:  40 capsule    Refill:  3    Order Specific Question:   Supervising Provider    Answer:   KHALavera Guise4[place] DISCONTD: amphetamine-dextroamphetamine (ADDERALL) 10 MG tablet    Sig: Take 1 tablet (10 mg total) by mouth 2 (two) times daily.    Dispense:  60 tablet    Refill:  0    To fill after 10/22/2019    Order Specific Question:   Supervising Provider    Answer:   KHALavera Guise4[[id number]] amphetamine-dextroamphetamine (ADDERALL) 10 MG tablet    Sig: Take 1 tablet (10 mg total) by mouth 2 (two) times daily.    Dispense:  60 tablet    Refill:  0    To fill after 11/20/2019    Order Specific Question:   Supervising Provider    Answer:   KHALavera Guise4[[id number]] Total  time spent: 60 Minutes  Time spent includes review of chart, medications, test results, and follow up plan with the patient.     Lavera Guise, MD  Internal Medicine

## 2019-09-26 LAB — URINE CULTURE, REFLEX

## 2019-09-26 LAB — UA/M W/RFLX CULTURE, ROUTINE
Bilirubin, UA: NEGATIVE
Glucose, UA: NEGATIVE
Nitrite, UA: POSITIVE — AB
Protein,UA: NEGATIVE
RBC, UA: NEGATIVE
Specific Gravity, UA: 1.022 (ref 1.005–1.030)
Urobilinogen, Ur: 1 mg/dL (ref 0.2–1.0)
pH, UA: 5.5 (ref 5.0–7.5)

## 2019-09-26 LAB — MICROSCOPIC EXAMINATION
Bacteria, UA: NONE SEEN
Casts: NONE SEEN /lpf
RBC, Urine: NONE SEEN /hpf (ref 0–2)

## 2019-09-27 LAB — CULTURE, URINE COMPREHENSIVE

## 2019-09-27 NOTE — Progress Notes (Signed)
Yes. I started that at her visit. Started with macrobid twice daily for next 7 days. She will then take it once daily after that for prevention of further uti.

## 2019-09-27 NOTE — Progress Notes (Signed)
Reviewed. Patient treated for severe uti at visit. May be cause of elevated WBC and renal functions. Will recheck labs prior to next visit for further evaluation.

## 2019-10-07 ENCOUNTER — Encounter: Payer: Self-pay | Admitting: Nurse Practitioner

## 2019-10-14 ENCOUNTER — Ambulatory Visit: Payer: Managed Care, Other (non HMO) | Admitting: Nurse Practitioner

## 2019-10-18 ENCOUNTER — Encounter: Payer: Self-pay | Admitting: Nurse Practitioner

## 2019-10-19 ENCOUNTER — Ambulatory Visit: Payer: Managed Care, Other (non HMO) | Admitting: Hospice and Palliative Medicine

## 2019-10-19 NOTE — Telephone Encounter (Signed)
Please check this

## 2019-10-28 ENCOUNTER — Other Ambulatory Visit: Payer: Self-pay

## 2019-10-28 ENCOUNTER — Encounter: Payer: Self-pay | Admitting: Nurse Practitioner

## 2019-10-28 ENCOUNTER — Ambulatory Visit (INDEPENDENT_AMBULATORY_CARE_PROVIDER_SITE_OTHER): Payer: Managed Care, Other (non HMO) | Admitting: Nurse Practitioner

## 2019-10-28 ENCOUNTER — Telehealth: Payer: Self-pay

## 2019-10-28 VITALS — BP 128/70 | HR 101 | Temp 97.5°F | Resp 16 | Ht 64.0 in | Wt 179.6 lb

## 2019-10-28 DIAGNOSIS — I679 Cerebrovascular disease, unspecified: Secondary | ICD-10-CM

## 2019-10-28 DIAGNOSIS — R251 Tremor, unspecified: Secondary | ICD-10-CM

## 2019-10-28 DIAGNOSIS — E782 Mixed hyperlipidemia: Secondary | ICD-10-CM

## 2019-10-28 DIAGNOSIS — R29818 Other symptoms and signs involving the nervous system: Secondary | ICD-10-CM

## 2019-10-28 NOTE — Telephone Encounter (Signed)
Pt will check insurance to be sure that [facility] physiatry is in network.

## 2019-10-28 NOTE — Progress Notes (Signed)
[facility], [place] [address]  Internal MEDICINE  Office Visit Note  Patient Name: Tamara Garrett  [id number]  [id number]  Date of Service: 11/03/2019  Chief Complaint  Patient presents with  . Follow-up    neurology referral covered per her insurance  . Gastroesophageal Reflux  . controlled substance    reviewed  . Nausea    not from taking antibiotic  . Chronic Kidney Disease    wants to see if her kidneys are ok     The patient is here for follow up. She needs to have referral to neurologist who is covered per her insurance. She continues to suffer from intermittent dizziness, tremors in the hands and arms, and episodes of numbness in the face and hands. She was seeing a neurologist at [facility], however, this neurologist will no longer accept her insurance.  The patient also needs to have referral to psychiatry and mental health counselor. She does suffer from anxiety and depression which needs further evaluation and treatment per mental health provider. She was given referral at her last visit, however, health insurance has changed since then and she needs one who is covered by her insurance.       Current Medication: Outpatient Encounter Medications as of 10/28/2019  Medication Sig  . amphetamine-dextroamphetamine (ADDERALL) 10 MG tablet Take 1 tablet (10 mg total) by mouth 2 (two) times daily.  . clonazePAM (KLONOPIN) 0.25 MG disintegrating tablet Take 1 tab by mouth twice a day. May increase to 3 times daily if needed after 1 week  . Cyanocobalamin (B-12 PO) Take by mouth.  . diphenoxylate-atropine (LOMOTIL) 2.5-0.025 MG tablet Take 1 tablet by mouth 4 (four) times daily as needed for diarrhea or loose stools.  . fluconazole (DIFLUCAN) 150 MG tablet Take 1 tablet (150 mg total) by mouth every three (3) days as needed.  . gabapentin (NEURONTIN) 600 MG tablet 600 mg. Take 1 tab 3-4 times daily.  . nitrofurantoin, macrocrystal-monohydrate,  (MACROBID) 100 MG capsule Take 1 capsule po BID for 10 dys  Then take 1 capsule daily to prevent UTI  . nortriptyline (PAMELOR) 10 MG capsule 10 mg. Take 2 caps nightly.  Marland Kitchen omeprazole (PRILOSEC) 20 MG capsule TAKE 1 CAPSULE BY MOUTH EVERY DAY  . oxyCODONE-acetaminophen (PERCOCET/ROXICET) 5-325 MG tablet Take 1 tablet by mouth every 8 (eight) hours as needed for severe pain.  . phenazopyridine (PYRIDIUM) 200 MG tablet Take 1 tablet (200 mg total) by mouth 3 (three) times daily as needed for pain.  Marland Kitchen QUEtiapine (SEROQUEL) 25 MG tablet 25 mg. Take 1-2 tabs at night for sleep.  . rosuvastatin (CRESTOR) 20 MG tablet Take 1 tablet (20 mg total) by mouth daily.  Marland Kitchen venlafaxine (EFFEXOR) 75 MG tablet Take 75 mg by mouth 2 (two) times daily.   No facility-administered encounter medications on file as of 10/28/2019.    Surgical History: Past Surgical History:  Procedure Laterality Date  . ABDOMINAL HYSTERECTOMY    . BREAST BIOPSY Left 2005   benign  . COLONOSCOPY    . COLONOSCOPY WITH PROPOFOL N/A 04/02/2016   Procedure: COLONOSCOPY WITH PROPOFOL;  Surgeon: Lollie Sails, MD;  Location: [facility] ENDOSCOPY;  Service: Endoscopy;  Laterality: N/A;  . KIDNEY STONE SURGERY Right   . LAPAROSCOPIC GASTRIC BYPASS      Medical History: Past Medical History:  Diagnosis Date  . Anxiety   . Chronic kidney disease    kidney stones  . GERD (gastroesophageal reflux disease)  Family History: Family History  Problem Relation Age of Onset  . Colon polyps Mother   . COPD Mother   . Congestive Heart Failure Mother   . Diabetes Mother   . Dementia Father   . Stroke Father     Social History   Socioeconomic History  . Marital status: Married    Spouse name: Not on file  . Number of children: Not on file  . Years of education: Not on file  . Highest education level: Not on file  Occupational History  . Not on file  Tobacco Use  . Smoking status: Current Every Day Smoker    Packs/day: 1.00     Types: Cigarettes  . Smokeless tobacco: Never Used  Vaping Use  . Vaping Use: Former  Substance and Sexual Activity  . Alcohol use: No  . Drug use: No  . Sexual activity: Not on file  Other Topics Concern  . Not on file  Social History Narrative  . Not on file   Social Determinants of Health   Financial Resource Strain:   . Difficulty of Paying Living Expenses: Not on file  Food Insecurity:   . Worried About Charity fundraiser in the Last Year: Not on file  . Ran Out of Food in the Last Year: Not on file  Transportation Needs:   . Lack of Transportation (Medical): Not on file  . Lack of Transportation (Non-Medical): Not on file  Physical Activity:   . Days of Exercise per Week: Not on file  . Minutes of Exercise per Session: Not on file  Stress:   . Feeling of Stress : Not on file  Social Connections:   . Frequency of Communication with Friends and Family: Not on file  . Frequency of Social Gatherings with Friends and Family: Not on file  . Attends Religious Services: Not on file  . Active Member of Clubs or Organizations: Not on file  . Attends Archivist Meetings: Not on file  . Marital Status: Not on file  Intimate Partner Violence:   . Fear of Current or Ex-Partner: Not on file  . Emotionally Abused: Not on file  . Physically Abused: Not on file  . Sexually Abused: Not on file      Review of Systems  Constitutional: Positive for activity change and fatigue. Negative for chills, fever and unexpected weight change.       Patient remains out of work due to development and persistence of neurological symptoms   HENT: Negative for congestion, postnasal drip, rhinorrhea, sneezing and sore throat.   Respiratory: Negative for cough, chest tightness, shortness of breath and wheezing.   Cardiovascular: Negative for chest pain and palpitations.  Gastrointestinal: Negative for abdominal pain, constipation, diarrhea, nausea and vomiting.  Endocrine: Negative for  cold intolerance, heat intolerance, polydipsia and polyuria.  Genitourinary: Positive for dysuria, flank pain, frequency and urgency.  Musculoskeletal: Positive for arthralgias and myalgias. Negative for back pain, joint swelling and neck pain.  Skin: Negative for rash.  Allergic/Immunologic: Negative for environmental allergies.  Neurological: Positive for dizziness, tremors, weakness and headaches. Negative for numbness.       Loss of balance causing falls. Gets dizzy, then feels like she is going to pass out. She loses balance. Symptoms have worsened since her most recent visit. The patient is now seeing new neurologist at [facility].   Hematological: Negative for adenopathy. Does not bruise/bleed easily.  Psychiatric/Behavioral: Positive for decreased concentration. Negative for behavioral problems (Depression),  sleep disturbance and suicidal ideas. The patient is nervous/anxious.     Today's Vitals   10/28/19 1400  BP: 128/70  Pulse: (!) 101  Resp: 16  Temp: (!) 97.5 F (36.4 C)  SpO2: 99%  Weight: 179 lb 9.6 oz (81.5 kg)  Height: 5\' 4"  (1.626 m)   Body mass index is 30.83 kg/m.  Physical Exam Vitals and nursing note reviewed.  Constitutional:      General: She is not in acute distress.    Appearance: Normal appearance. She is well-developed. She is not diaphoretic.  HENT:     Head: Normocephalic and atraumatic.     Nose: Nose normal.     Mouth/Throat:     Pharynx: No oropharyngeal exudate.  Eyes:     Pupils: Pupils are equal, round, and reactive to light.  Neck:     Thyroid: No thyromegaly.     Vascular: No carotid bruit or JVD.     Trachea: No tracheal deviation.  Cardiovascular:     Rate and Rhythm: Normal rate and regular rhythm.     Heart sounds: Normal heart sounds. No murmur heard.  No friction rub. No gallop.   Pulmonary:     Effort: Pulmonary effort is normal. No respiratory distress.     Breath sounds: Normal breath sounds. No wheezing or rales.  Chest:      Chest wall: No tenderness.  Abdominal:     Palpations: Abdomen is soft.  Musculoskeletal:        General: Normal range of motion.     Cervical back: Normal range of motion and neck supple.  Lymphadenopathy:     Cervical: No cervical adenopathy.  Skin:    General: Skin is warm and dry.  Neurological:     Mental Status: She is alert and oriented to person, place, and time. Mental status is at baseline.     Cranial Nerves: No cranial nerve deficit.     Motor: Tremor present.     Gait: Gait abnormal.     Comments: Coarse tremor noted in both upper extremities and head.   Psychiatric:        Mood and Affect: Mood normal.        Behavior: Behavior normal.        Thought Content: Thought content normal.        Judgment: Judgment normal.    Assessment/Plan: 1. Cerebrovascular disease New referral made to Dr. Ralene Ok At [facility] for further evaluation and treatment.  - Ambulatory referral to Neurology  2. Other symptoms and signs involving the nervous system New referral made to Dr. Ralene Ok At [facility] for further evaluation and treatment.  - Ambulatory referral to Neurology  3. Tremor New referral made to Dr. Ralene Ok At [facility] for further evaluation and treatment.   4. Mixed hyperlipidemia Continue crestor as prescribed  General Counseling: Rosann verbalizes understanding of the findings of todays visit and agrees with plan of treatment. I have discussed any further diagnostic evaluation that may be needed or ordered today. We also reviewed her medications today. she has been encouraged to call the office with any questions or concerns that should arise related to todays visit.  This patient was seen by Leretha Pol FNP Collaboration with Dr Lavera Guise as a part of collaborative care agreement  Orders Placed This Encounter  Procedures  . Ambulatory referral to Neurology     Total time spent: 30 Minutes   Time spent includes review of chart, medications, test  results,  and follow up plan with the patient.      Dr Lavera Guise Internal medicine

## 2019-11-05 DIAGNOSIS — H903 Sensorineural hearing loss, bilateral: Secondary | ICD-10-CM | POA: Insufficient documentation

## 2019-11-05 DIAGNOSIS — G43809 Other migraine, not intractable, without status migrainosus: Secondary | ICD-10-CM | POA: Insufficient documentation

## 2019-11-05 DIAGNOSIS — H8102 Meniere's disease, left ear: Secondary | ICD-10-CM | POA: Insufficient documentation

## 2019-11-08 ENCOUNTER — Other Ambulatory Visit: Payer: Self-pay

## 2019-11-08 ENCOUNTER — Ambulatory Visit
Admission: RE | Admit: 2019-11-08 | Discharge: 2019-11-08 | Disposition: A | Discharge status: Home / Self Care | Payer: Managed Care, Other (non HMO) | Source: Ambulatory Visit | Attending: Nurse Practitioner | Admitting: Nurse Practitioner

## 2019-11-08 DIAGNOSIS — Z1231 Encounter for screening mammogram for malignant neoplasm of breast: Secondary | ICD-10-CM | POA: Diagnosis not present

## 2019-11-10 NOTE — Progress Notes (Signed)
Negative mammogram

## 2019-11-25 ENCOUNTER — Other Ambulatory Visit: Payer: Self-pay

## 2019-12-07 ENCOUNTER — Other Ambulatory Visit: Payer: Self-pay

## 2019-12-07 DIAGNOSIS — E782 Mixed hyperlipidemia: Secondary | ICD-10-CM

## 2019-12-07 MED ORDER — ROSUVASTATIN CALCIUM 20 MG PO TABS: 20.0000 mg | ORAL_TABLET | Freq: Every day | ORAL | 3 refills | Status: DC

## 2019-12-23 ENCOUNTER — Other Ambulatory Visit: Payer: Self-pay

## 2019-12-23 ENCOUNTER — Encounter: Payer: Self-pay | Admitting: Nurse Practitioner

## 2019-12-23 ENCOUNTER — Ambulatory Visit (INDEPENDENT_AMBULATORY_CARE_PROVIDER_SITE_OTHER): Payer: Managed Care, Other (non HMO) | Admitting: Nurse Practitioner

## 2019-12-23 VITALS — BP 113/67 | HR 85 | Temp 97.5°F | Resp 16 | Ht 64.0 in | Wt 173.6 lb

## 2019-12-23 DIAGNOSIS — I679 Cerebrovascular disease, unspecified: Secondary | ICD-10-CM | POA: Diagnosis not present

## 2019-12-23 DIAGNOSIS — R110 Nausea: Secondary | ICD-10-CM | POA: Diagnosis not present

## 2019-12-23 DIAGNOSIS — Z79899 Other long term (current) drug therapy: Secondary | ICD-10-CM

## 2019-12-23 DIAGNOSIS — E782 Mixed hyperlipidemia: Secondary | ICD-10-CM

## 2019-12-23 DIAGNOSIS — R41840 Attention and concentration deficit: Secondary | ICD-10-CM | POA: Diagnosis not present

## 2019-12-23 LAB — POCT URINE DRUG SCREEN
Methylenedioxyamphetamine: NOT DETECTED
POC Amphetamine UR: NOT DETECTED
POC BENZODIAZEPINES UR: NOT DETECTED
POC Barbiturate UR: NOT DETECTED
POC Cocaine UR: NOT DETECTED
POC Ecstasy UR: NOT DETECTED
POC Marijuana UR: NOT DETECTED
POC Methadone UR: NOT DETECTED
POC Methamphetamine UR: NOT DETECTED
POC Opiate Ur: NOT DETECTED
POC Oxycodone UR: POSITIVE — AB
POC PHENCYCLIDINE UR: NOT DETECTED
POC TRICYCLICS UR: NOT DETECTED

## 2019-12-23 MED ORDER — ONDANSETRON HCL 8 MG PO TABS: 8.0000 mg | ORAL_TABLET | Freq: Three times a day (TID) | ORAL | 2 refills | Status: DC | PRN

## 2019-12-23 MED ORDER — AMPHETAMINE-DEXTROAMPHETAMINE 10 MG PO TABS: 10.0000 mg | ORAL_TABLET | Freq: Two times a day (BID) | ORAL | 0 refills | Status: DC

## 2019-12-23 NOTE — Progress Notes (Signed)
[facility] [place], [place] [address]  Internal MEDICINE  Office Visit Note  Patient Name: Tamara Garrett  [id number]  [id number]  Date of Service: 01/23/2020  Chief Complaint  Patient presents with  . Follow-up  . Gastroesophageal Reflux  . controlled substance form    reviewed with PT  . Nausea    The patient is here for follow up visit. Finally has upcoming appointment with neurology on 01/16/2020. Neurologist she was seeing prior to this had made several changes to her medication to handle depression and anxiety. She is no longer seeing the neurologist who made the changes, but does not see new neurologist until 01/16/2020. Diagnosed with Meniere's disease. She does have moderate nausea today. She currently does not have any medication which she can take for nausea. Comes on quickly. She does still have moderate and intermittent dizziness. This has not really improved too much over the last months.  States that she is doing better today than she has in some time. Drinking more fluid. IS seeing ENT provider in [place] who diagnosed the Meniere's disease.  Was referred to psychiatry at her last visit, however, she has not heard anything about this appointment .       Current Medication: Outpatient Encounter Medications as of 12/23/2019  Medication Sig  . amphetamine-dextroamphetamine (ADDERALL) 10 MG tablet Take 1 tablet (10 mg total) by mouth 2 (two) times daily.  . clonazePAM (KLONOPIN) 0.25 MG disintegrating tablet Take 1 tab by mouth twice a day. May increase to 3 times daily if needed after 1 week  . Cyanocobalamin (B-12 PO) Take by mouth.  . diphenoxylate-atropine (LOMOTIL) 2.5-0.025 MG tablet Take 1 tablet by mouth 4 (four) times daily as needed for diarrhea or loose stools.  . fluconazole (DIFLUCAN) 150 MG tablet Take 1 tablet (150 mg total) by mouth every three (3) days as needed.  . gabapentin (NEURONTIN) 600 MG tablet 600 mg. Take 1 tab 3-4  times daily.  . nitrofurantoin, macrocrystal-monohydrate, (MACROBID) 100 MG capsule Take 1 capsule po BID for 10 dys  Then take 1 capsule daily to prevent UTI  . nortriptyline (PAMELOR) 10 MG capsule 10 mg. Take 2 caps nightly.  Marland Kitchen omeprazole (PRILOSEC) 20 MG capsule TAKE 1 CAPSULE BY MOUTH EVERY DAY  . oxyCODONE-acetaminophen (PERCOCET/ROXICET) 5-325 MG tablet Take 1 tablet by mouth every 8 (eight) hours as needed for severe pain.  . phenazopyridine (PYRIDIUM) 200 MG tablet Take 1 tablet (200 mg total) by mouth 3 (three) times daily as needed for pain.  Marland Kitchen QUEtiapine (SEROQUEL) 25 MG tablet 25 mg. Take 1-2 tabs at night for sleep.  . rosuvastatin (CRESTOR) 20 MG tablet Take 1 tablet (20 mg total) by mouth daily.  Marland Kitchen venlafaxine (EFFEXOR) 75 MG tablet Take 75 mg by mouth 2 (two) times daily.  . [DISCONTINUED] amphetamine-dextroamphetamine (ADDERALL) 10 MG tablet Take 1 tablet (10 mg total) by mouth 2 (two) times daily.  . ondansetron (ZOFRAN) 8 MG tablet Take 1 tablet (8 mg total) by mouth every 8 (eight) hours as needed for nausea or vomiting.   No facility-administered encounter medications on file as of 12/23/2019.    Surgical History: Past Surgical History:  Procedure Laterality Date  . ABDOMINAL HYSTERECTOMY    . BREAST BIOPSY Left 2005   benign  . COLONOSCOPY    . COLONOSCOPY WITH PROPOFOL N/A 04/02/2016   Procedure: COLONOSCOPY WITH PROPOFOL;  Surgeon: Lollie Sails, MD;  Location: [facility] ENDOSCOPY;  Service: Endoscopy;  Laterality:  N/A;  . KIDNEY STONE SURGERY Right   . LAPAROSCOPIC GASTRIC BYPASS      Medical History: Past Medical History:  Diagnosis Date  . Anxiety   . Chronic kidney disease    kidney stones  . GERD (gastroesophageal reflux disease)     Family History: Family History  Problem Relation Age of Onset  . Colon polyps Mother   . COPD Mother   . Congestive Heart Failure Mother   . Diabetes Mother   . Dementia Father   . Stroke Father     Social  History   Socioeconomic History  . Marital status: Married    Spouse name: Not on file  . Number of children: Not on file  . Years of education: Not on file  . Highest education level: Not on file  Occupational History  . Not on file  Tobacco Use  . Smoking status: Current Every Day Smoker    Packs/day: 1.00    Types: Cigarettes  . Smokeless tobacco: Never Used  Vaping Use  . Vaping Use: Former  Substance and Sexual Activity  . Alcohol use: No  . Drug use: No  . Sexual activity: Not on file  Other Topics Concern  . Not on file  Social History Narrative  . Not on file   Social Determinants of Health   Financial Resource Strain: Not on file  Food Insecurity: Not on file  Transportation Needs: Not on file  Physical Activity: Not on file  Stress: Not on file  Social Connections: Not on file  Intimate Partner Violence: Not on file      Review of Systems  Constitutional: Positive for activity change and fatigue. Negative for chills, fever and unexpected weight change.       Patient remains out of work due to development and persistence of neurological symptoms. Has noted some improvement over past several weeks.   HENT: Negative for congestion, postnasal drip, rhinorrhea, sneezing and sore throat.   Respiratory: Negative for cough, chest tightness, shortness of breath and wheezing.   Cardiovascular: Negative for chest pain and palpitations.  Gastrointestinal: Negative for abdominal pain, constipation, diarrhea, nausea and vomiting.  Endocrine: Negative for cold intolerance, heat intolerance, polydipsia and polyuria.  Musculoskeletal: Positive for arthralgias and myalgias. Negative for back pain, joint swelling and neck pain.  Skin: Negative for rash.  Allergic/Immunologic: Negative for environmental allergies.  Neurological: Positive for dizziness, tremors, weakness and headaches. Negative for numbness.       Loss of balance causing falls. Gets dizzy, then feels like she  is going to pass out. She loses balance. Symptoms have worsened since her most recent visit. The patient is now seeing new neurologist at [facility].   Hematological: Negative for adenopathy. Does not bruise/bleed easily.  Psychiatric/Behavioral: Positive for decreased concentration. Negative for behavioral problems (Depression), sleep disturbance and suicidal ideas. The patient is nervous/anxious.     Today's Vitals   12/23/19 0953  BP: 113/67  Pulse: 85  Resp: 16  Temp: (!) 97.5 F (36.4 C)  SpO2: 99%  Weight: 173 lb 9.6 oz (78.7 kg)  Height: 5\' 4"  (1.626 m)   Body mass index is 29.8 kg/m.  Physical Exam Vitals and nursing note reviewed.  Constitutional:      General: She is not in acute distress.    Appearance: Normal appearance. She is well-developed. She is not diaphoretic.  HENT:     Head: Normocephalic and atraumatic.     Nose: Nose normal.  Mouth/Throat:     Pharynx: No oropharyngeal exudate.  Eyes:     Pupils: Pupils are equal, round, and reactive to light.  Neck:     Thyroid: No thyromegaly.     Vascular: No carotid bruit or JVD.     Trachea: No tracheal deviation.  Cardiovascular:     Rate and Rhythm: Normal rate and regular rhythm.     Heart sounds: Normal heart sounds. No murmur heard. No friction rub. No gallop.   Pulmonary:     Effort: Pulmonary effort is normal. No respiratory distress.     Breath sounds: Normal breath sounds. No wheezing or rales.  Chest:     Chest wall: No tenderness.  Abdominal:     Palpations: Abdomen is soft.  Musculoskeletal:        General: Normal range of motion.     Cervical back: Normal range of motion and neck supple.  Lymphadenopathy:     Cervical: No cervical adenopathy.  Skin:    General: Skin is warm and dry.  Neurological:     Mental Status: She is alert and oriented to person, place, and time. Mental status is at baseline.     Cranial Nerves: No cranial nerve deficit.     Motor: Tremor present.     Gait:  Gait abnormal.     Comments: Improved tremor since her last visit.   Psychiatric:        Mood and Affect: Mood normal.        Behavior: Behavior normal.        Thought Content: Thought content normal.        Judgment: Judgment normal.    Assessment/Plan:  1. Mixed hyperlipidemia Patient now on crestor 20mg  daily. Will recheck fasting lipid panel prior to next visit and adjust dosing as indicated.   2. Cerebrovascular disease Patient now seeing neurology. Reviewed medication changes and updated her current medication list to reflect adjustments. Continue regular visits as scheduled.   3. Attention and concentration deficit May take adderall 10mg  up to twice daily as needed for focus and concentration. A single 30 day prescription was sent to her pharmacy.  - amphetamine-dextroamphetamine (ADDERALL) 10 MG tablet; Take 1 tablet (10 mg total) by mouth 2 (two) times daily.  Dispense: 60 tablet; Refill: 0  4. Nausea May take zofran up to three times daily as needed for nausea.  - ondansetron (ZOFRAN) 8 MG tablet; Take 1 tablet (8 mg total) by mouth every 8 (eight) hours as needed for nausea or vomiting.  Dispense: 30 tablet; Refill: 2  5. Encounter for long-term (current) use of high-risk medication - POCT Urine Drug Screen positive for oxycodone only. She does see pain management provider.    General Counseling: Romayne verbalizes understanding of the findings of todays visit and agrees with plan of treatment. I have discussed any further diagnostic evaluation that may be needed or ordered today. We also reviewed her medications today. she has been encouraged to call the office with any questions or concerns that should arise related to todays visit.  This patient was seen by Leretha Pol FNP Collaboration with Dr Lavera Guise as a part of collaborative care agreement  Orders Placed This Encounter  Procedures  . POCT Urine Drug Screen    Meds ordered this encounter  Medications  .  amphetamine-dextroamphetamine (ADDERALL) 10 MG tablet    Sig: Take 1 tablet (10 mg total) by mouth 2 (two) times daily.    Dispense:  60 tablet  Refill:  0    Order Specific Question:   Supervising Provider    Answer:   Lavera Guise [[id number]]  . ondansetron (ZOFRAN) 8 MG tablet    Sig: Take 1 tablet (8 mg total) by mouth every 8 (eight) hours as needed for nausea or vomiting.    Dispense:  30 tablet    Refill:  2    Order Specific Question:   Supervising Provider    Answer:   Lavera Guise [[id number]]    Total time spent: 30 Minutes   Time spent includes review of chart, medications, test results, and follow up plan with the patient.      Dr Lavera Guise Internal medicine

## 2020-01-06 ENCOUNTER — Encounter: Payer: Managed Care, Other (non HMO) | Admitting: Nurse Practitioner

## 2020-01-23 DIAGNOSIS — R110 Nausea: Secondary | ICD-10-CM | POA: Insufficient documentation

## 2020-01-26 ENCOUNTER — Telehealth: Payer: Self-pay

## 2020-01-26 NOTE — Telephone Encounter (Signed)
Hey. I was talking to Tat about this. She really needs to be seen for it as her overall situation is complicated. Thanks.

## 2020-01-27 ENCOUNTER — Telehealth: Payer: Self-pay

## 2020-01-27 NOTE — Telephone Encounter (Signed)
Returning pt's call to make appt with [facility] for refill request for Adderall, unable to LVM

## 2020-01-27 NOTE — Telephone Encounter (Signed)
Pt called to get appt scheduled

## 2020-01-28 ENCOUNTER — Ambulatory Visit (INDEPENDENT_AMBULATORY_CARE_PROVIDER_SITE_OTHER): Payer: BLUE CROSS/BLUE SHIELD | Admitting: Nurse Practitioner

## 2020-01-28 ENCOUNTER — Encounter: Payer: Self-pay | Admitting: Nurse Practitioner

## 2020-01-28 VITALS — Temp 98.7°F

## 2020-01-28 DIAGNOSIS — R41840 Attention and concentration deficit: Secondary | ICD-10-CM | POA: Diagnosis not present

## 2020-01-28 DIAGNOSIS — U071 COVID-19: Secondary | ICD-10-CM | POA: Diagnosis not present

## 2020-01-28 DIAGNOSIS — I679 Cerebrovascular disease, unspecified: Secondary | ICD-10-CM | POA: Diagnosis not present

## 2020-01-28 DIAGNOSIS — J069 Acute upper respiratory infection, unspecified: Secondary | ICD-10-CM

## 2020-01-28 DIAGNOSIS — F321 Major depressive disorder, single episode, moderate: Secondary | ICD-10-CM | POA: Diagnosis not present

## 2020-01-28 MED ORDER — AZITHROMYCIN 250 MG PO TABS: ORAL_TABLET | ORAL | 0 refills | Status: DC

## 2020-01-28 MED ORDER — AMPHETAMINE-DEXTROAMPHETAMINE 10 MG PO TABS: 10.0000 mg | ORAL_TABLET | Freq: Two times a day (BID) | ORAL | 0 refills | Status: DC

## 2020-01-28 MED ORDER — PREDNISONE 10 MG (21) PO TBPK: ORAL_TABLET | ORAL | 0 refills | Status: DC

## 2020-01-28 NOTE — Progress Notes (Signed)
[facility] [place], [place] [address]  Internal MEDICINE  Telephone Visit  Patient Name: Tamara Garrett  [id number]  [id number]  Date of Service: 01/28/2020  I connected with the patient at 1:06pm by telephone and verified the patients identity using two identifiers.   I discussed the limitations, risks, security and privacy concerns of performing an evaluation and management service by telephone and the availability of in person appointments. I also discussed with the patient that there may be a patient responsible charge related to the service.  The patient expressed understanding and agrees to proceed.    Chief Complaint  Patient presents with  . Telephone Screen  . Telephone Assessment  . Cough  . Headache  . Sore Throat  . Fatigue    The patient has been contacted via telephone for follow up visit due to concerns for spread of novel coronavirus.  -initially, appointment set up to get refills for adderall. Had done referral to psychiatry several months ago. She states that she has not heard anything about this appointment being scheduled. History of depression and adult ADD along with current treatment for cerebrovascular disease, I believe she should have this consultation for medication management of mental health medication. She is having to start with new neurologist and ENT specialist due to change in insurance. She is unsure if she may need to have new referral -positive COVID 19 test on 01/24/2020. She has been following home isolation recommendations for COVID 19 since the onset of symptoms.       Current Medication: Outpatient Encounter Medications as of 01/28/2020  Medication Sig  . azithromycin (ZITHROMAX) 250 MG tablet z-pack - take as directed for 5 days  . clonazePAM (KLONOPIN) 0.25 MG disintegrating tablet Take 1 tab by mouth twice a day. May increase to 3 times daily if needed after 1 week  . Cyanocobalamin (B-12 PO) Take by mouth.  .  diphenoxylate-atropine (LOMOTIL) 2.5-0.025 MG tablet Take 1 tablet by mouth 4 (four) times daily as needed for diarrhea or loose stools.  . fluconazole (DIFLUCAN) 150 MG tablet Take 1 tablet (150 mg total) by mouth every three (3) days as needed.  . gabapentin (NEURONTIN) 600 MG tablet 600 mg. Take 1 tab 3-4 times daily.  . nitrofurantoin, macrocrystal-monohydrate, (MACROBID) 100 MG capsule Take 1 capsule po BID for 10 dys  Then take 1 capsule daily to prevent UTI  . nortriptyline (PAMELOR) 10 MG capsule 10 mg. Take 2 caps nightly.  Marland Kitchen omeprazole (PRILOSEC) 20 MG capsule TAKE 1 CAPSULE BY MOUTH EVERY DAY  . ondansetron (ZOFRAN) 8 MG tablet Take 1 tablet (8 mg total) by mouth every 8 (eight) hours as needed for nausea or vomiting.  Marland Kitchen oxyCODONE-acetaminophen (PERCOCET/ROXICET) 5-325 MG tablet Take 1 tablet by mouth every 8 (eight) hours as needed for severe pain.  . phenazopyridine (PYRIDIUM) 200 MG tablet Take 1 tablet (200 mg total) by mouth 3 (three) times daily as needed for pain.  . predniSONE (STERAPRED UNI-PAK 21 TAB) 10 MG (21) TBPK tablet 6 day taper - take by mouth as directed for 6 days  . QUEtiapine (SEROQUEL) 25 MG tablet 25 mg. Take 1-2 tabs at night for sleep.  . rosuvastatin (CRESTOR) 20 MG tablet Take 1 tablet (20 mg total) by mouth daily.  Marland Kitchen venlafaxine (EFFEXOR) 75 MG tablet Take 75 mg by mouth 2 (two) times daily.  . [DISCONTINUED] amphetamine-dextroamphetamine (ADDERALL) 10 MG tablet Take 1 tablet (10 mg total) by mouth 2 (two) times  daily.  . amphetamine-dextroamphetamine (ADDERALL) 10 MG tablet Take 1 tablet (10 mg total) by mouth 2 (two) times daily.   No facility-administered encounter medications on file as of 01/28/2020.    Surgical History: Past Surgical History:  Procedure Laterality Date  . ABDOMINAL HYSTERECTOMY    . BREAST BIOPSY Left 2005   benign  . COLONOSCOPY    . COLONOSCOPY WITH PROPOFOL N/A 04/02/2016   Procedure: COLONOSCOPY WITH PROPOFOL;  Surgeon: Lollie Sails, MD;  Location: [facility] ENDOSCOPY;  Service: Endoscopy;  Laterality: N/A;  . KIDNEY STONE SURGERY Right   . LAPAROSCOPIC GASTRIC BYPASS      Medical History: Past Medical History:  Diagnosis Date  . Anxiety   . Chronic kidney disease    kidney stones  . GERD (gastroesophageal reflux disease)     Family History: Family History  Problem Relation Age of Onset  . Colon polyps Mother   . COPD Mother   . Congestive Heart Failure Mother   . Diabetes Mother   . Dementia Father   . Stroke Father     Social History   Socioeconomic History  . Marital status: Married    Spouse name: Not on file  . Number of children: Not on file  . Years of education: Not on file  . Highest education level: Not on file  Occupational History  . Not on file  Tobacco Use  . Smoking status: Current Every Day Smoker    Packs/day: 1.00    Types: Cigarettes  . Smokeless tobacco: Never Used  Vaping Use  . Vaping Use: Former  Substance and Sexual Activity  . Alcohol use: No  . Drug use: No  . Sexual activity: Not on file  Other Topics Concern  . Not on file  Social History Narrative  . Not on file   Social Determinants of Health   Financial Resource Strain: Not on file  Food Insecurity: Not on file  Transportation Needs: Not on file  Physical Activity: Not on file  Stress: Not on file  Social Connections: Not on file  Intimate Partner Violence: Not on file      Review of Systems  Constitutional: Positive for chills, fatigue and fever.  HENT: Positive for congestion, postnasal drip, rhinorrhea, sinus pressure, sinus pain, sore throat and voice change. Negative for ear pain.   Respiratory: Positive for cough and wheezing.   Cardiovascular: Negative for chest pain and palpitations.  Gastrointestinal: Positive for nausea. Negative for vomiting.  Allergic/Immunologic: Positive for environmental allergies.  Neurological: Positive for dizziness and headaches.   Psychiatric/Behavioral: Positive for decreased concentration. The patient is nervous/anxious.     Vital Signs: Temp 98.7 F (37.1 C)    Observation/Objective:   The patient is alert and oriented. She is pleasant and answers all questions appropriately. Breathing is non-labored. She is in no acute distress at this time. The patient has harsh, non productive cough. Voice is hoarse     Assessment/Plan:  1. Upper respiratory tract infection due to COVID-19 virus Start z-pack. Take as directed for 5 days. Add prednisone taper for 6 days. Should also take OTC zinc, vitamin d, and vitamin c every day. Can also use otc medications as needed and as indicated for symptom management  - azithromycin (ZITHROMAX) 250 MG tablet; z-pack - take as directed for 5 days  Dispense: 6 tablet; Refill: 0 - predniSONE (STERAPRED UNI-PAK 21 TAB) 10 MG (21) TBPK tablet; 6 day taper - take by mouth as directed for 6  days  Dispense: 21 tablet; Refill: 0  2. Cerebrovascular disease Currently seeing neurologist at [facility]. Will have to switch to [facility] provider due to insurance. Will make appointment if PCP referral needed.   3. Moderate major depression ([place]) Refer to psychiatry for further evaluation.  - Ambulatory referral to Psychiatry  4. Attention and concentration deficit Fill history reviewed per PDMP. Refill adderall 10mg  twice daily as needed to help focus/concntration. Refer to psychiatry for further evaluation.  - Ambulatory referral to Psychiatry - amphetamine-dextroamphetamine (ADDERALL) 10 MG tablet; Take 1 tablet (10 mg total) by mouth 2 (two) times daily.  Dispense: 60 tablet; Refill: 0  General Counseling: Starling verbalizes understanding of the findings of today's phone visit and agrees with plan of treatment. I have discussed any further diagnostic evaluation that may be needed or ordered today. We also reviewed her medications today. she has been encouraged to call the office with any questions or  concerns that should arise related to todays visit.      Person Under Monitoring Name: Tamara Garrett  Location: [place] [address]   Infection Prevention Recommendations for Individuals Confirmed to have, or Being Evaluated for, 2019 Novel Coronavirus (COVID-19) Infection Who Receive Care at Home  Individuals who are confirmed to have, or are being evaluated for, COVID-19 should follow the prevention steps below until a healthcare provider or local or state health department says they can return to normal activities.  Stay home except to get medical care You should restrict activities outside your home, except for getting medical care. Do not go to work, school, or public areas, and do not use public transportation or taxis.  Call ahead before visiting your doctor Before your medical appointment, call the healthcare provider and tell them that you have, or are being evaluated for, COVID-19 infection. This will help the healthcare provider's office take steps to keep other people from getting infected. Ask your healthcare provider to call the local or state health department.  Monitor your symptoms Seek prompt medical attention if your illness is worsening (e.g., difficulty breathing). Before going to your medical appointment, call the healthcare provider and tell them that you have, or are being evaluated for, COVID-19 infection. Ask your healthcare provider to call the local or state health department.  Wear a facemask You should wear a facemask that covers your nose and mouth when you are in the same room with other people and when you visit a healthcare provider. People who live with or visit you should also wear a facemask while they are in the same room with you.  Separate yourself from other people in your home As much as possible, you should stay in a different room from other people in your home. Also, you should use a separate bathroom, if  available.  Avoid sharing household items You should not share dishes, drinking glasses, cups, eating utensils, towels, bedding, or other items with other people in your home. After using these items, you should wash them thoroughly with soap and water.  Cover your coughs and sneezes Cover your mouth and nose with a tissue when you cough or sneeze, or you can cough or sneeze into your sleeve. Throw used tissues in a lined trash can, and immediately wash your hands with soap and water for at least 20 seconds or use an alcohol-based hand rub.  Wash your [facility] your hands often and thoroughly with soap and water for at least 20 seconds. You can use  an alcohol-based hand sanitizer if soap and water are not available and if your hands are not visibly dirty. Avoid touching your eyes, nose, and mouth with unwashed hands.   Prevention Steps for Caregivers and Household Members of Individuals Confirmed to have, or Being Evaluated for, COVID-19 Infection Being Cared for in the Home  If you live with, or provide care at home for, a person confirmed to have, or being evaluated for, COVID-19 infection please follow these guidelines to prevent infection:  Follow healthcare provider's instructions Make sure that you understand and can help the patient follow any healthcare provider instructions for all care.  Provide for the patient's basic needs You should help the patient with basic needs in the home and provide support for getting groceries, prescriptions, and other personal needs.  Monitor the patient's symptoms If they are getting sicker, call his or her medical provider and tell them that the patient has, or is being evaluated for, COVID-19 infection. This will help the healthcare provider's office take steps to keep other people from getting infected. Ask the healthcare provider to call the local or state health department.  Limit the number of people who have contact with the  patient  If possible, have only one caregiver for the patient.  Other household members should stay in another home or place of residence. If this is not possible, they should stay  in another room, or be separated from the patient as much as possible. Use a separate bathroom, if available.  Restrict visitors who do not have an essential need to be in the home.  Keep older adults, very young children, and other sick people away from the patient Keep older adults, very young children, and those who have compromised immune systems or chronic health conditions away from the patient. This includes people with chronic heart, lung, or kidney conditions, diabetes, and cancer.  Ensure good ventilation Make sure that shared spaces in the home have good air flow, such as from an air conditioner or an opened window, weather permitting.  Wash your hands often  Wash your hands often and thoroughly with soap and water for at least 20 seconds. You can use an alcohol based hand sanitizer if soap and water are not available and if your hands are not visibly dirty.  Avoid touching your eyes, nose, and mouth with unwashed hands.  Use disposable paper towels to dry your hands. If not available, use dedicated cloth towels and replace them when they become wet.  Wear a facemask and gloves  Wear a disposable facemask at all times in the room and gloves when you touch or have contact with the patient's blood, body fluids, and/or secretions or excretions, such as sweat, saliva, sputum, nasal mucus, vomit, urine, or feces.  Ensure the mask fits over your nose and mouth tightly, and do not touch it during use.  Throw out disposable facemasks and gloves after using them. Do not reuse.  Wash your hands immediately after removing your facemask and gloves.  If your personal clothing becomes contaminated, carefully remove clothing and launder. Wash your hands after handling contaminated clothing.  Place all used  disposable facemasks, gloves, and other waste in a lined container before disposing them with other household waste.  Remove gloves and wash your hands immediately after handling these items.  Do not share dishes, glasses, or other household items with the patient  Avoid sharing household items. You should not share dishes, drinking glasses, cups, eating utensils, towels, bedding, or  other items with a patient who is confirmed to have, or being evaluated for, COVID-19 infection.  After the person uses these items, you should wash them thoroughly with soap and water.  Wash laundry thoroughly  Immediately remove and wash clothes or bedding that have blood, body fluids, and/or secretions or excretions, such as sweat, saliva, sputum, nasal mucus, vomit, urine, or feces, on them.  Wear gloves when handling laundry from the patient.  Read and follow directions on labels of laundry or clothing items and detergent. In general, wash and dry with the warmest temperatures recommended on the label.  Clean all areas the individual has used often  Clean all touchable surfaces, such as counters, tabletops, doorknobs, bathroom fixtures, toilets, phones, keyboards, tablets, and bedside tables, every day. Also, clean any surfaces that may have blood, body fluids, and/or secretions or excretions on them.  Wear gloves when cleaning surfaces the patient has come in contact with.  Use a diluted bleach solution (e.g., dilute bleach with 1 part bleach and 10 parts water) or a household disinfectant with a label that says EPA-registered for coronaviruses. To make a bleach solution at home, add 1 tablespoon of bleach to 1 quart (4 cups) of water. For a larger supply, add  cup of bleach to 1 gallon (16 cups) of water.  Read labels of cleaning products and follow recommendations provided on product labels. Labels contain instructions for safe and effective use of the cleaning product including precautions you should  take when applying the product, such as wearing gloves or eye protection and making sure you have good ventilation during use of the product.  Remove gloves and wash hands immediately after cleaning.  Monitor yourself for signs and symptoms of illness Caregivers and household members are considered close contacts, should monitor their health, and will be asked to limit movement outside of the home to the extent possible. Follow the monitoring steps for close contacts listed on the symptom monitoring form.   ? If you have additional questions, contact your local health department or call the epidemiologist on call at [phone] (available 24/7). ? This guidance is subject to change. For the most up-to-date guidance from [facility], please refer to their website: YouBlogs.pl  This patient was seen by Leretha Pol FNP Collaboration with Dr Lavera Guise as a part of collaborative care agreement  Orders Placed This Encounter  Procedures  . Ambulatory referral to Psychiatry    Meds ordered this encounter  Medications  . azithromycin (ZITHROMAX) 250 MG tablet    Sig: z-pack - take as directed for 5 days    Dispense:  6 tablet    Refill:  0    Order Specific Question:   Supervising Provider    Answer:   Lavera Guise [place]  . predniSONE (STERAPRED UNI-PAK 21 TAB) 10 MG (21) TBPK tablet    Sig: 6 day taper - take by mouth as directed for 6 days    Dispense:  21 tablet    Refill:  0    Order Specific Question:   Supervising Provider    Answer:   Lavera Guise [place]  . amphetamine-dextroamphetamine (ADDERALL) 10 MG tablet    Sig: Take 1 tablet (10 mg total) by mouth 2 (two) times daily.    Dispense:  60 tablet    Refill:  0    Order Specific Question:   Supervising Provider    Answer:   Lavera Guise [id number]    Time spent: 30 Minutes  Dr Lavera Guise Internal medicine

## 2020-02-03 ENCOUNTER — Encounter: Payer: Self-pay | Admitting: Nurse Practitioner

## 2020-02-22 ENCOUNTER — Ambulatory Visit: Payer: Managed Care, Other (non HMO) | Admitting: Hospice and Palliative Medicine

## 2020-03-02 ENCOUNTER — Encounter: Payer: Self-pay | Admitting: Physician Assistant

## 2020-03-02 ENCOUNTER — Ambulatory Visit (INDEPENDENT_AMBULATORY_CARE_PROVIDER_SITE_OTHER): Payer: BLUE CROSS/BLUE SHIELD | Admitting: Physician Assistant

## 2020-03-02 ENCOUNTER — Other Ambulatory Visit: Payer: Self-pay

## 2020-03-02 DIAGNOSIS — I679 Cerebrovascular disease, unspecified: Secondary | ICD-10-CM

## 2020-03-02 DIAGNOSIS — R41840 Attention and concentration deficit: Secondary | ICD-10-CM | POA: Diagnosis not present

## 2020-03-02 DIAGNOSIS — F321 Major depressive disorder, single episode, moderate: Secondary | ICD-10-CM | POA: Diagnosis not present

## 2020-03-02 DIAGNOSIS — H9313 Tinnitus, bilateral: Secondary | ICD-10-CM

## 2020-03-02 DIAGNOSIS — F17219 Nicotine dependence, cigarettes, with unspecified nicotine-induced disorders: Secondary | ICD-10-CM | POA: Diagnosis not present

## 2020-03-02 MED ORDER — AMPHETAMINE-DEXTROAMPHETAMINE 10 MG PO TABS: 10.0000 mg | ORAL_TABLET | Freq: Two times a day (BID) | ORAL | 0 refills | Status: DC

## 2020-03-02 NOTE — Progress Notes (Signed)
[facility] [place], [place] [address]  Internal MEDICINE  Office Visit Note  Patient Name: Tamara Garrett  [id number]  [id number]  Date of Service: 03/05/2020  Chief Complaint  Patient presents with  . Medication Refill    2 month f-up    HPI Pt is here for a 2 month f/u and medication refill. She is accompanied by her sister. She needs her adderall refilled until she can get in with psych. She has filled out the paperwork and had to mail it in to their office and is waiting for them to call back and get her set up with an appointment. Adderrall is taken 1 time per day usually. Occassionally takes second dose if she has plans for the PM that she needs to be attentive for such as doctor's appointments or errands. Pt noted to be tachycardic on intake, likely due to being anxious for visit and taking adderall prior to appointment, but HR began to normalize during appointment.  Today she reports having some ear pain in R ear. She has a long hx of ear issues. Sees neurology and ENT for possible [place]. She sees neurologist in 1 week and then will be referred to ENT as necessary. Ringing in L ear and R ear feels full all the time, but seems worse and is hurting today. She also has trouble finding her words for which neurology will also be addressing.  She also smokes 1ppd and is not interested in quitting at this time, but does inquire about potential future options when she is ready.  Current Medication: Outpatient Encounter Medications as of 03/02/2020  Medication Sig  . clonazePAM (KLONOPIN) 0.25 MG disintegrating tablet Take 1 tab by mouth twice a day. May increase to 3 times daily if needed after 1 week  . Cyanocobalamin (B-12 PO) Take by mouth.  . diphenoxylate-atropine (LOMOTIL) 2.5-0.025 MG tablet Take 1 tablet by mouth 4 (four) times daily as needed for diarrhea or loose stools.  . gabapentin (NEURONTIN) 600 MG tablet 600 mg. Take 1 tab 3-4 times daily.   . nitrofurantoin, macrocrystal-monohydrate, (MACROBID) 100 MG capsule Take 1 capsule po BID for 10 dys  Then take 1 capsule daily to prevent UTI  . nortriptyline (PAMELOR) 10 MG capsule 10 mg. Take 2 caps nightly.  Marland Kitchen omeprazole (PRILOSEC) 20 MG capsule TAKE 1 CAPSULE BY MOUTH EVERY DAY  . ondansetron (ZOFRAN) 8 MG tablet Take 1 tablet (8 mg total) by mouth every 8 (eight) hours as needed for nausea or vomiting.  Marland Kitchen oxyCODONE-acetaminophen (PERCOCET/ROXICET) 5-325 MG tablet Take 1 tablet by mouth every 8 (eight) hours as needed for severe pain.  Marland Kitchen QUEtiapine (SEROQUEL) 25 MG tablet 25 mg. Take 1-2 tabs at night for sleep.  . rosuvastatin (CRESTOR) 20 MG tablet Take 1 tablet (20 mg total) by mouth daily.  Marland Kitchen venlafaxine (EFFEXOR) 75 MG tablet Take 75 mg by mouth 2 (two) times daily.  . [DISCONTINUED] amphetamine-dextroamphetamine (ADDERALL) 10 MG tablet Take 1 tablet (10 mg total) by mouth 2 (two) times daily.  Marland Kitchen amphetamine-dextroamphetamine (ADDERALL) 10 MG tablet Take 1 tablet (10 mg total) by mouth 2 (two) times daily.  Marland Kitchen azithromycin (ZITHROMAX) 250 MG tablet z-pack - take as directed for 5 days (Patient not taking: Reported on 03/02/2020)  . fluconazole (DIFLUCAN) 150 MG tablet Take 1 tablet (150 mg total) by mouth every three (3) days as needed. (Patient not taking: Reported on 03/02/2020)  . phenazopyridine (PYRIDIUM) 200 MG tablet Take 1 tablet (  200 mg total) by mouth 3 (three) times daily as needed for pain. (Patient not taking: Reported on 03/02/2020)  . predniSONE (STERAPRED UNI-PAK 21 TAB) 10 MG (21) TBPK tablet 6 day taper - take by mouth as directed for 6 days (Patient not taking: Reported on 03/02/2020)   No facility-administered encounter medications on file as of 03/02/2020.    Surgical History: Past Surgical History:  Procedure Laterality Date  . ABDOMINAL HYSTERECTOMY    . BREAST BIOPSY Left 2005   benign  . COLONOSCOPY    . COLONOSCOPY WITH PROPOFOL N/A 04/02/2016   Procedure:  COLONOSCOPY WITH PROPOFOL;  Surgeon: Lollie Sails, MD;  Location: [facility] ENDOSCOPY;  Service: Endoscopy;  Laterality: N/A;  . KIDNEY STONE SURGERY Right   . LAPAROSCOPIC GASTRIC BYPASS      Medical History: Past Medical History:  Diagnosis Date  . Anxiety   . Chronic kidney disease    kidney stones  . GERD (gastroesophageal reflux disease)     Family History: Family History  Problem Relation Age of Onset  . Colon polyps Mother   . COPD Mother   . Congestive Heart Failure Mother   . Diabetes Mother   . Dementia Father   . Stroke Father     Social History   Socioeconomic History  . Marital status: Married    Spouse name: Not on file  . Number of children: Not on file  . Years of education: Not on file  . Highest education level: Not on file  Occupational History  . Not on file  Tobacco Use  . Smoking status: Current Every Day Smoker    Packs/day: 1.00    Types: Cigarettes  . Smokeless tobacco: Never Used  Vaping Use  . Vaping Use: Former  Substance and Sexual Activity  . Alcohol use: No  . Drug use: No  . Sexual activity: Not on file  Other Topics Concern  . Not on file  Social History Narrative  . Not on file   Social Determinants of Health   Financial Resource Strain: Not on file  Food Insecurity: Not on file  Transportation Needs: Not on file  Physical Activity: Not on file  Stress: Not on file  Social Connections: Not on file  Intimate Partner Violence: Not on file      Review of Systems  Constitutional: Negative for chills, fatigue and unexpected weight change.  HENT: Positive for ear pain, postnasal drip and tinnitus. Negative for congestion, rhinorrhea, sneezing and sore throat.        R ear pain, bilateral tinnitus, possible meniere's  Eyes: Negative for redness.  Respiratory: Negative for cough, chest tightness and shortness of breath.   Cardiovascular: Negative for chest pain and palpitations.  Gastrointestinal: Negative for abdominal  pain, constipation, diarrhea, nausea and vomiting.  Genitourinary: Negative for dysuria and frequency.  Musculoskeletal: Negative for arthralgias, back pain, joint swelling and neck pain.  Skin: Negative for rash.  Neurological: Positive for speech difficulty. Negative for tremors and numbness.  Hematological: Negative for adenopathy. Does not bruise/bleed easily.  Psychiatric/Behavioral: Negative for behavioral problems (Depression), sleep disturbance and suicidal ideas. The patient is nervous/anxious.     Vital Signs: BP 132/76   Pulse (!) 102   Temp 98.1 F (36.7 C)   Resp 16   Wt 167 lb (75.8 kg)   SpO2 97%   BMI 28.67 kg/m    Physical Exam Constitutional:      General: She is not in acute distress.  Appearance: She is well-developed. She is not diaphoretic.  HENT:     Head: Normocephalic and atraumatic.     Right Ear: Tympanic membrane normal.     Left Ear: Tympanic membrane normal.     Mouth/Throat:     Pharynx: No oropharyngeal exudate.  Eyes:     Pupils: Pupils are equal, round, and reactive to light.  Neck:     Thyroid: No thyromegaly.     Vascular: No JVD.     Trachea: No tracheal deviation.  Cardiovascular:     Rate and Rhythm: Regular rhythm. Tachycardia present.     Heart sounds: Normal heart sounds. No murmur heard. No friction rub. No gallop.   Pulmonary:     Effort: Pulmonary effort is normal. No respiratory distress.     Breath sounds: No wheezing or rales.  Chest:     Chest wall: No tenderness.  Abdominal:     General: Bowel sounds are normal.     Palpations: Abdomen is soft.  Musculoskeletal:        General: Normal range of motion.     Cervical back: Normal range of motion and neck supple.  Lymphadenopathy:     Cervical: No cervical adenopathy.  Skin:    General: Skin is warm and dry.  Neurological:     Mental Status: She is alert and oriented to person, place, and time.     Cranial Nerves: No cranial nerve deficit.  Psychiatric:         Behavior: Behavior normal.        Thought Content: Thought content normal.        Judgment: Judgment normal.        Assessment/Plan: 1. Attention and concentration deficit Pt is in the process of establishing care with psych. Will refill medication today while awaiting appointment with them. Pt will only take second tab if needed. - amphetamine-dextroamphetamine (ADDERALL) 10 MG tablet; Take 1 tablet (10 mg total) by mouth 2 (two) times daily.  Dispense: 60 tablet; Refill: 0  2. Moderate major depression ([place]) Pt is in the process of establishing care with psych.  3. Cerebrovascular disease Pt is being followed by neurology.  4. Tinnitus of both ears Pt is being followed by neurology and will then also see ENT for possible meniere's which may explain ringing, fullness and pain in ears.  5. Cigarette nicotine dependence with nicotine-induced disorder Pt not open to quitting at this time, but may be in the future. Smoking cessation counseling: 1. Pt acknowledges the risks of long term smoking, she will try to quite smoking. 2. Options for different medications including nicotine products, chewing gum, patch etc, Wellbutrin and Chantix is discussed 3. Goal and date of compete cessation is discussed 4. Total time spent in smoking cessation is 10 min.   General Counseling: Jasmia verbalizes understanding of the findings of todays visit and agrees with plan of treatment. I have discussed any further diagnostic evaluation that may be needed or ordered today. We also reviewed her medications today. she has been encouraged to call the office with any questions or concerns that should arise related to todays visit.    No orders of the defined types were placed in this encounter.   Meds ordered this encounter  Medications  . amphetamine-dextroamphetamine (ADDERALL) 10 MG tablet    Sig: Take 1 tablet (10 mg total) by mouth 2 (two) times daily.    Dispense:  60 tablet    Refill:  0  Total time spent:30 Minutes Time spent includes review of chart, medications, test results, and follow up plan with the patient.      Dr Lavera Guise Internal medicine

## 2020-03-06 ENCOUNTER — Other Ambulatory Visit: Payer: Self-pay

## 2020-03-06 DIAGNOSIS — E782 Mixed hyperlipidemia: Secondary | ICD-10-CM

## 2020-03-06 MED ORDER — ROSUVASTATIN CALCIUM 20 MG PO TABS: 20.0000 mg | ORAL_TABLET | Freq: Every day | ORAL | 3 refills | Status: DC

## 2020-03-15 ENCOUNTER — Other Ambulatory Visit: Payer: Self-pay | Admitting: Nurse Practitioner

## 2020-03-15 DIAGNOSIS — K219 Gastro-esophageal reflux disease without esophagitis: Secondary | ICD-10-CM

## 2020-03-16 ENCOUNTER — Other Ambulatory Visit: Payer: Self-pay

## 2020-03-16 NOTE — Telephone Encounter (Signed)
Pt can take OTC omeprazole

## 2020-03-16 NOTE — Telephone Encounter (Signed)
Its not covered

## 2020-04-13 ENCOUNTER — Ambulatory Visit: Payer: BLUE CROSS/BLUE SHIELD | Admitting: Physician Assistant

## 2020-04-17 ENCOUNTER — Telehealth: Payer: Self-pay

## 2020-04-17 NOTE — Telephone Encounter (Signed)
Completed medical records for [facility] records to [facility] [address]  [place] [address]

## 2020-04-20 ENCOUNTER — Ambulatory Visit: Payer: BLUE CROSS/BLUE SHIELD | Admitting: Physician Assistant

## 2020-05-08 ENCOUNTER — Other Ambulatory Visit: Payer: Self-pay | Admitting: Nurse Practitioner

## 2020-05-08 DIAGNOSIS — U071 COVID-19: Secondary | ICD-10-CM

## 2020-05-08 DIAGNOSIS — J069 Acute upper respiratory infection, unspecified: Secondary | ICD-10-CM

## 2020-05-17 ENCOUNTER — Telehealth: Payer: Self-pay

## 2020-05-17 NOTE — Telephone Encounter (Signed)
Completed medical records for [facility] Mailed to [facility] [address] [facility] [place] [address] [place], [place], [address]  

## 2020-06-03 ENCOUNTER — Other Ambulatory Visit: Payer: Self-pay | Admitting: Internal Medicine

## 2020-06-03 DIAGNOSIS — E782 Mixed hyperlipidemia: Secondary | ICD-10-CM

## 2020-06-12 ENCOUNTER — Encounter: Payer: Self-pay | Admitting: Physician Assistant

## 2020-06-13 NOTE — Telephone Encounter (Signed)
Please see this

## 2020-06-22 NOTE — Telephone Encounter (Signed)
Please review

## 2020-07-11 ENCOUNTER — Encounter: Payer: Self-pay | Admitting: Internal Medicine

## 2020-08-02 ENCOUNTER — Telehealth: Payer: Self-pay

## 2020-08-02 NOTE — Telephone Encounter (Signed)
Mailed patient a revised appt reminder. Her original cpe scheduled for 09-22-20 needed to be r/s. Patients VM was full so I mailed appt revision on 08-02-20. Tamara Garrett

## 2020-08-12 ENCOUNTER — Encounter: Payer: Self-pay | Admitting: Internal Medicine

## 2020-08-15 ENCOUNTER — Telehealth: Payer: Self-pay

## 2020-08-15 NOTE — Telephone Encounter (Signed)
Called patient in regards to mychart message she sent in reference to previously requested medical records. After speaking with patient she is going to reach out to her Neurologist before proceeding with request for records to be sent to another primary care physician.

## 2020-08-22 ENCOUNTER — Telehealth: Payer: Self-pay

## 2020-08-22 NOTE — Telephone Encounter (Signed)
Spoke to pt regarding records, informed her that we will need a request and asked her to find out what records she will need for new PCP office, pt stated when records are completed she or her sister can pick them up

## 2020-09-22 ENCOUNTER — Encounter: Payer: Managed Care, Other (non HMO) | Admitting: Nurse Practitioner

## 2020-10-02 ENCOUNTER — Encounter: Payer: BLUE CROSS/BLUE SHIELD | Admitting: Nurse Practitioner

## 2020-12-02 ENCOUNTER — Other Ambulatory Visit: Payer: Self-pay | Admitting: Internal Medicine

## 2020-12-02 DIAGNOSIS — E782 Mixed hyperlipidemia: Secondary | ICD-10-CM

## 2020-12-27 ENCOUNTER — Other Ambulatory Visit: Payer: Self-pay | Admitting: Internal Medicine

## 2020-12-27 DIAGNOSIS — E782 Mixed hyperlipidemia: Secondary | ICD-10-CM

## 2021-02-09 NOTE — Telephone Encounter (Signed)
Tamara Garrett told her back in August she needs to come in and complete a Medical Release form with all information as to who are sending it to. She still hasn't done that. I will message her back to let her know.

## 2021-05-11 DIAGNOSIS — F17200 Nicotine dependence, unspecified, uncomplicated: Secondary | ICD-10-CM | POA: Insufficient documentation

## 2022-05-23 DIAGNOSIS — Z87891 Personal history of nicotine dependence: Secondary | ICD-10-CM | POA: Insufficient documentation

## 2022-05-23 DIAGNOSIS — Z9884 Bariatric surgery status: Secondary | ICD-10-CM | POA: Insufficient documentation

## 2022-09-11 DIAGNOSIS — E039 Hypothyroidism, unspecified: Secondary | ICD-10-CM | POA: Insufficient documentation

## 2022-09-11 DIAGNOSIS — J439 Emphysema, unspecified: Secondary | ICD-10-CM | POA: Insufficient documentation

## 2022-11-13 ENCOUNTER — Ambulatory Visit: Payer: PPO | Admitting: Psychiatry

## 2022-11-13 ENCOUNTER — Encounter: Payer: Self-pay | Admitting: Psychiatry

## 2022-11-13 VITALS — BP 106/66 | HR 63 | Temp 97.6°F | Ht 64.0 in | Wt 139.0 lb

## 2022-11-13 DIAGNOSIS — F333 Major depressive disorder, recurrent, severe with psychotic symptoms: Secondary | ICD-10-CM | POA: Diagnosis not present

## 2022-11-13 DIAGNOSIS — Z79899 Other long term (current) drug therapy: Secondary | ICD-10-CM

## 2022-11-13 DIAGNOSIS — F410 Panic disorder [episodic paroxysmal anxiety] without agoraphobia: Secondary | ICD-10-CM | POA: Diagnosis not present

## 2022-11-13 DIAGNOSIS — Z9189 Other specified personal risk factors, not elsewhere classified: Secondary | ICD-10-CM | POA: Diagnosis not present

## 2022-11-13 DIAGNOSIS — R413 Other amnesia: Secondary | ICD-10-CM

## 2022-11-13 NOTE — Patient Instructions (Addendum)
Please call for EKG - [phone]   [email or web address]  [email or web address]  [email or web address] [facility] [address], [place], [place] [address]   [phone]  [facility] [email or web address] [address], [place], [place] [address]  [phone]  [facility] [email or web address] [address], [place], [place] [address]  [phone]   [facility] - [phone]  [facility] - [phone]  [facility] - [phone] counseling [phone]  [facility] [phone]   [email or web address] this clinician can offer telehealth and has a sliding scale option  [email or web address] this group also offers sliding scale rates and is based out of [place]  Dr. Liborio Nixon with the [facility] specializes in divorce  [employer] [facility] has interns who offer sliding scale rates and some of the full time clinicians do, as well. You complete their contact form on their website and the referrals coordinator will help to get connected to someone   Medicaid below :  [facility] [address] [place], [place] [address]  [phone]    [employer] [address] [place], [place] [address]  [phone]    [facility] [address] [place], [place] [address]  [phone]     Aripiprazole Tablets What is this medication? ARIPIPRAZOLE (ay ri PIP ray zole) treats schizophrenia, bipolar I disorder, autism spectrum disorder, and Tourette disorder. It may also be used with antidepressant medications to treat depression. It works by balancing the levels of dopamine and serotonin in the brain, hormones that help regulate mood,  behaviors, and thoughts. It belongs to a group of medications called antipsychotics. Antipsychotics can be used to treat several kinds of mental health conditions. This medicine may be used for other purposes; ask your health care provider or pharmacist if you have questions. COMMON BRAND NAME(S): Abilify What should I tell my care team before I take this medication? They need to know if you have any of these conditions: Dementia Diabetes Difficulty swallowing Have trouble controlling your muscles Heart disease History of irregular heartbeat History of stroke Low blood cell levels (white cells, red cells, and platelets) Low blood pressure Parkinson disease Seizures Suicidal thoughts, plans, or attempt by you or a family member Urges to engage in impulsive behaviors in ways that are unusual for you An unusual or allergic reaction to aripiprazole, other medications, foods, dyes, or preservatives Pregnant or trying to get pregnant Breastfeeding How should I use this medication? Take this medication by mouth with a glass of water. Take it as directed on the prescription label at the same time every day. You can take it with or without food. If it upsets your stomach, take it with food. Do not take your medication more often than directed. Keep taking it unless your care team tells you to stop. A special MedGuide will be given to you by the pharmacist with each prescription and refill. Be sure to read this information carefully each time. Talk to your care team about the use of this medication in children. While it may be prescribed for children as young as 6 years for selected conditions, precautions do apply. Overdosage: If you think you have taken too much of this medicine contact a poison control center or emergency room at once. NOTE: This medicine is only for you. Do not share  this medicine with others. What if I miss a dose? If you miss a dose, take it as soon as you can. If it is  almost time for your next dose, take only that dose. Do not take double or extra doses. What may interact with this medication? Do not take this medication with any of the following: Brexpiprazole Cisapride Dextromethorphan; quinidine Dronedarone Metoclopramide Pimozide Quinidine Thioridazine This medication may also interact with the following: Antihistamines for allergy, cough, and cold Carbamazepine Certain medications for anxiety or sleep Certain medications for depression, such as amitriptyline, fluoxetine, paroxetine, or sertraline Certain medications for fungal infections, such as fluconazole, itraconazole, ketoconazole, posaconazole, or voriconazole Clarithromycin General anesthetics, such as halothane, isoflurane, methoxyflurane, or propofol Medications for Parkinson disease, such as levodopa Medications for blood pressure Medications for seizures Medications that relax muscles for surgery Opioid medications for pain Other medications that cause heart rhythm changes Phenothiazines, such as chlorpromazine or prochlorperazine Rifampin This list may not describe all possible interactions. Give your health care provider a list of all the medicines, herbs, non-prescription drugs, or dietary supplements you use. Also tell them if you smoke, drink alcohol, or use illegal drugs. Some items may interact with your medicine. What should I watch for while using this medication? Visit your care team for regular checks on your progress. Tell your care team if your symptoms do not start to get better or if they get worse. Do not suddenly stop taking this medication. You may develop a severe reaction. Your care team will tell you how much medication to take. If your care team wants you to stop the medication, the dose may be slowly lowered over time to avoid any side effects. Patients and their families should watch out for new or worsening depression or thoughts of suicide. Also watch out for  sudden changes in feelings such as feeling anxious, agitated, panicky, irritable, hostile, aggressive, impulsive, severely restless, overly excited and hyperactive, or not being able to sleep. If this happens, especially at the beginning of antidepressant treatment or after a change in dose, call your care team. This medication may affect your coordination, reaction time, or judgment. Do not drive or operate machinery until you know how this medication affects you. Sit up or stand slowly to reduce the risk of dizzy or fainting spells. Drinking alcohol with this medication can increase the risk of these side effects. This medication can cause problems with controlling your body temperature. It can lower the response of your body to cold temperatures. If possible, stay indoors during cold weather. If you must go outdoors, wear warm clothes. It can also lower the response of your body to heat. Do not overheat. Do not over-exercise. Stay out of the sun when possible. If you must be in the sun, wear cool clothing. Drink plenty of water. If you have trouble controlling your body temperature, call your care team right away. This medication may cause dry eyes and blurred vision. If you wear contact lenses, you may feel some discomfort. Lubricating eye drops may help. See your care team if the problem does not go away or is severe. This medication may increase blood sugar. Ask your care team if changes in diet or medications are needed if you have diabetes. There have been reports of increased sexual urges or other strong urges such as gambling while taking this medication. If you experience any of these while taking this medication, you should report this to your care team as soon as possible. What side  effects may I notice from receiving this medication? Side effects that you should report to your care team as soon as possible: Allergic reactions--skin rash, itching, hives, swelling of the face, lips, tongue, or  throat High blood sugar (hyperglycemia)--increased thirst or amount of urine, unusual weakness or fatigue, blurry vision High fever, stiff muscles, increased sweating, fast or irregular heartbeat, and confusion, which may be signs of neuroleptic malignant syndrome Low blood pressure--dizziness, feeling faint or lightheaded, blurry vision Pain or trouble swallowing Prolonged or painful erection Seizures Stroke--sudden numbness or weakness of the face, arm, or leg, trouble speaking, confusion, trouble walking, loss of balance or coordination, dizziness, severe headache, change in vision Uncontrolled and repetitive body movements, muscle stiffness or spasms, tremors or shaking, loss of balance or coordination, restlessness, shuffling walk, which may be signs of extrapyramidal symptoms (EPS) Thoughts of suicide or self-harm, worsening mood, feelings of depression Urges to engage in impulsive behaviors such as gambling, binge eating, sexual activity, or shopping in ways that are unusual for you Side effects that usually do not require medical attention (report these to your care team if they continue or are bothersome): Constipation Drowsiness Weight gain This list may not describe all possible side effects. Call your doctor for medical advice about side effects. You may report side effects to FDA at 1-800-FDA-1088. Where should I keep my medication? Keep out of the reach of children and pets. Store at room temperature between 15 and 30 degrees C (59 and 86 degrees F). Throw away any unused medication after the expiration date. NOTE: This sheet is a summary. It may not cover all possible information. If you have questions about this medicine, talk to your doctor, pharmacist, or health care provider.  2024 Elsevier/Gold Standard (2021-07-28 00:00:00)
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
## 2022-11-14 DIAGNOSIS — R413 Other amnesia: Secondary | ICD-10-CM | POA: Insufficient documentation

## 2022-11-20 ENCOUNTER — Other Ambulatory Visit
Admission: RE | Admit: 2022-11-20 | Discharge: 2022-11-20 | Disposition: A | Discharge status: Home / Self Care | Payer: Self-pay | Source: Ambulatory Visit | Attending: Internal Medicine | Admitting: Internal Medicine

## 2022-11-20 ENCOUNTER — Ambulatory Visit: Payer: Managed Care, Other (non HMO)

## 2022-11-20 ENCOUNTER — Ambulatory Visit
Admission: RE | Admit: 2022-11-20 | Discharge: 2022-11-20 | Disposition: A | Discharge status: Home / Self Care | Payer: BLUE CROSS/BLUE SHIELD | Source: Ambulatory Visit | Attending: Internal Medicine | Admitting: Internal Medicine

## 2022-11-20 DIAGNOSIS — R9431 Abnormal electrocardiogram [ECG] [EKG]: Secondary | ICD-10-CM | POA: Diagnosis not present

## 2022-11-20 DIAGNOSIS — Z9189 Other specified personal risk factors, not elsewhere classified: Secondary | ICD-10-CM | POA: Insufficient documentation

## 2022-11-20 DIAGNOSIS — Z79899 Other long term (current) drug therapy: Secondary | ICD-10-CM | POA: Insufficient documentation

## 2022-11-21 ENCOUNTER — Ambulatory Visit: Payer: Self-pay

## 2022-11-21 LAB — URINE DRUGS OF ABUSE SCREEN W ALC, ROUTINE (REF LAB)
Amphetamines, Urine: NEGATIVE ng/mL
Barbiturate, Ur: NEGATIVE ng/mL
Benzodiazepine Quant, Ur: NEGATIVE ng/mL
Cannabinoid Quant, Ur: NEGATIVE ng/mL
Cocaine (Metab.): NEGATIVE ng/mL
Ethanol U, Quan: NEGATIVE %
Methadone Screen, Urine: NEGATIVE ng/mL
Opiate Quant, Ur: NEGATIVE ng/mL
Phencyclidine, Ur: NEGATIVE ng/mL
Propoxyphene, Urine: NEGATIVE ng/mL

## 2022-11-22 ENCOUNTER — Telehealth: Payer: Self-pay | Admitting: Psychiatry

## 2022-11-22 DIAGNOSIS — F410 Panic disorder [episodic paroxysmal anxiety] without agoraphobia: Secondary | ICD-10-CM

## 2022-11-22 DIAGNOSIS — F333 Major depressive disorder, recurrent, severe with psychotic symptoms: Secondary | ICD-10-CM

## 2022-11-22 MED ORDER — CLONAZEPAM 0.5 MG PO TABS: 0.2500 mg | ORAL_TABLET | ORAL | 0 refills | Status: DC

## 2022-11-22 MED ORDER — ARIPIPRAZOLE 2 MG PO TABS: 2.0000 mg | ORAL_TABLET | Freq: Every morning | ORAL | 1 refills | Status: DC

## 2022-11-22 NOTE — Telephone Encounter (Signed)
  Contacted patient today. I have sent Abilify 2 mg to pharmacy after reviewing EKG dated 11/20/2022.  Reviewed urine drug screen-negative.  Will start clonazepam for emergencies/severe panic attacks only.  Patient advised not to combine with oxycodone.  Patient also reach out to pain provider to discuss this.  Patient did not keep her follow-up appointment.

## 2022-12-03 ENCOUNTER — Ambulatory Visit (INDEPENDENT_AMBULATORY_CARE_PROVIDER_SITE_OTHER): Payer: PPO | Admitting: Psychiatry

## 2022-12-03 ENCOUNTER — Encounter: Payer: Self-pay | Admitting: Psychiatry

## 2022-12-03 VITALS — BP 129/77 | HR 69 | Temp 97.1°F | Ht 64.0 in | Wt 141.2 lb

## 2022-12-03 DIAGNOSIS — F410 Panic disorder [episodic paroxysmal anxiety] without agoraphobia: Secondary | ICD-10-CM | POA: Diagnosis not present

## 2022-12-03 DIAGNOSIS — F333 Major depressive disorder, recurrent, severe with psychotic symptoms: Secondary | ICD-10-CM

## 2022-12-03 MED ORDER — ARIPIPRAZOLE 5 MG PO TABS: 5.0000 mg | ORAL_TABLET | Freq: Every morning | ORAL | 1 refills | Status: DC

## 2022-12-03 NOTE — Progress Notes (Unsigned)
BH MD OP Progress Note  12/03/2022 2:00 PM Tamara Garrett  MRN:  [id number]  Chief Complaint:  Chief Complaint  Patient presents with   Follow-up   Anxiety   Depression   Medication Refill   HPI: Tamara Garrett is a 57 year old Caucasian female on disability, married, lives in [place], has a history of MDD, panic disorder, history of gastric bypass, sensorineural deafness, cerebrovascular disease/dizziness, chronic nausea, cognitive issues, back pain was evaluated in office today.  Patient presented for follow-up appointment.  Patient today reports she is currently compliant on the Abilify.  She has not noticed any negative effect from the Abilify.  Patient reports she also has not noticed much benefit with regards to her mood symptoms on the current dosage.  She currently takes an Abilify 2 mg daily.  Patient agreeable to dosage increase.  Patient with renewed symptoms of sadness, low motivation, anhedonia, memory problems, concentration issue, panic attacks.  She will benefit from psychotherapy sessions.  Patient agreeable to do so.  Patient denies any suicidality, homicidality.  She does continues to have visual hallucinations of seeing images on and off.  However it is getting better.  Patient denies any other concerns today.  Visit Diagnosis:    ICD-10-CM   1. Severe episode of recurrent major depressive disorder, with psychotic features (HCC)  F33.3 ARIPiprazole (ABILIFY) 5 MG tablet    2. Panic disorder  F41.0       Past Psychiatric History: I have reviewed past psychiatric history from progress note on 11/13/2022.  Past trials of medications like Adderall, Klonopin, Prozac, BuSpar, propranolol, trazodone, venlafaxine, Seroquel.  Past Medical History:  Past Medical History:  Diagnosis Date   Anxiety    Chronic kidney disease    kidney stones   GERD (gastroesophageal reflux disease)     Past Surgical History:  Procedure Laterality Date   ABDOMINAL HYSTERECTOMY      BREAST BIOPSY Left 2005   benign   COLONOSCOPY     COLONOSCOPY WITH PROPOFOL N/A 04/02/2016   Procedure: COLONOSCOPY WITH PROPOFOL;  Surgeon: Christena Deem, MD;  [facility];  Service: Endoscopy;  Laterality: N/A;   KIDNEY STONE SURGERY Right    LAPAROSCOPIC GASTRIC BYPASS      Family Psychiatric History: I have reviewed family psychiatric history from progress note on 11/13/2022.  Family History:  Family History  Problem Relation Age of Onset   Colon polyps Mother    COPD Mother    Congestive Heart Failure Mother    Diabetes Mother    Dementia Father    Stroke Father     Social History: I have reviewed social history from progress note on 11/13/2022. Social History   Socioeconomic History   Marital status: Married    Spouse name: Not on file   Number of children: 1   Years of education: Not on file   Highest education level: Some college, no degree  Occupational History   Not on file  Tobacco Use   Smoking status: Every Day    Current packs/day: 1.00    Types: Cigarettes   Smokeless tobacco: Never  Vaping Use   Vaping status: Former  Substance and Sexual Activity   Alcohol use: No   Drug use: No   Sexual activity: Not Currently  Other Topics Concern   Not on file  Social History Narrative   Not on file   Social Determinants of Health   Financial Resource Strain: Medium Risk (08/08/2021)   Received from  [facility]   Overall Financial Resource Strain (CARDIA)    Difficulty of Paying Living Expenses: Somewhat hard  Food Insecurity: No Food Insecurity (08/08/2021)   Received from [facility]   Hunger Vital Sign    Worried About Running Out of Food in the Last Year: Never true    Ran Out of Food in the Last Year: Never true  Transportation Needs: No Transportation Needs (08/08/2021)   Received from [facility] - Transportation    Lack of Transportation (Medical): No    Lack of Transportation (Non-Medical): No   Physical Activity: Not on file  Stress: Not on file  Social Connections: Not on file    Allergies:  Allergies  Allergen Reactions   Nsaids Other (See Comments)    Patient has had gastric bypass.     Other     Had Gastric Bypass   Sulfamethoxazole Other (See Comments)   Sulfamethoxazole-Trimethoprim Other (See Comments)   Trimethoprim Other (See Comments)    Metabolic Disorder Labs: Lab Results  Component Value Date   HGBA1C 5.6 05/28/2019   No results found for: "PROLACTIN" Lab Results  Component Value Date   CHOL 217 (H) 05/28/2019   TRIG 236 (H) 05/28/2019   HDL 36 (L) 05/28/2019   LDLCALC 138 (H) 05/28/2019   Lab Results  Component Value Date   TSH 2.340 01/21/2019    Therapeutic Level Labs: No results found for: "LITHIUM" No results found for: "VALPROATE" No results found for: "CBMZ"  Current Medications: Current Outpatient Medications  Medication Sig Dispense Refill   ARIPiprazole (ABILIFY) 5 MG tablet Take 1 tablet (5 mg total) by mouth in the morning. 30 tablet 1   aspirin EC 81 MG tablet Take 1 tablet by mouth daily.     busPIRone (BUSPAR) 30 MG tablet Take by mouth.     clonazePAM (KLONOPIN) 0.5 MG tablet Take 0.5-1 tablets (0.25-0.5 mg total) by mouth as directed. Take half to one tablet once a day as needed for severe panic attacks only, please give atleast 8 hours gap with your oxycodone medication, do not combine. 8 tablet 0   cyanocobalamin (VITAMIN B12) 1000 MCG tablet Take by mouth.     dicyclomine (BENTYL) 10 MG capsule TAKE ONE CAPSULE BY MOUTH 3 TIMES A DAY AS NEEDED FOR CRAMPING     diphenoxylate-atropine (LOMOTIL) 2.5-0.025 MG tablet Take 1 tablet by mouth 4 (four) times daily as needed for diarrhea or loose stools. 30 tablet 0   fluconazole (DIFLUCAN) 150 MG tablet Take 1 tablet (150 mg total) by mouth every three (3) days as needed. 3 tablet 0   FLUoxetine (PROZAC) 40 MG capsule Take 2 capsules by mouth daily.     gabapentin (NEURONTIN)  600 MG tablet 600 mg. Take 1 tab 3-4 times daily.     levothyroxine (SYNTHROID) 50 MCG tablet Take 50 mcg by mouth daily.     omeprazole (PRILOSEC) 20 MG capsule TAKE 1 CAPSULE BY MOUTH EVERY DAY 90 capsule 1   ondansetron (ZOFRAN) 8 MG tablet Take 1 tablet (8 mg total) by mouth every 8 (eight) hours as needed for nausea or vomiting. 30 tablet 2   oxyCODONE-acetaminophen (PERCOCET/ROXICET) 5-325 MG tablet Take 1 tablet by mouth every 8 (eight) hours as needed for severe pain.     promethazine (PHENERGAN) 12.5 MG tablet Take by mouth.     propranolol (INDERAL) 10 MG tablet Take 1 tablet by mouth 2 (two) times daily.  rosuvastatin (CRESTOR) 20 MG tablet TAKE 1 TABLET BY MOUTH EVERY DAY 90 tablet 1   traZODone (DESYREL) 50 MG tablet Take 50 mg by mouth at bedtime as needed.     No current facility-administered medications for this visit.     Musculoskeletal: Strength & Muscle Tone: within normal limits Gait & Station: normal Patient leans: N/A  Psychiatric Specialty Exam: Review of Systems  Psychiatric/Behavioral:  Positive for dysphoric mood and hallucinations. The patient is nervous/anxious.     Blood pressure 129/77, pulse 69, temperature (!) 97.1 F (36.2 C), temperature source Skin, height 5\' 4"  (1.626 m), weight 141 lb 3.2 oz (64 kg).Body mass index is 24.24 kg/m.  General Appearance: Fairly Groomed  Eye Contact:  Fair  Speech:  Clear and Coherent  Volume:  Normal  Mood:  Anxious and Depressed  Affect:  Congruent  Thought Process:  Goal Directed and Descriptions of Associations: Intact  Orientation:  Full (Time, Place, and Person)  Thought Content: Hallucinations: Visual seeing shadows - improving  Suicidal Thoughts:  No  Homicidal Thoughts:  No  Memory:  Immediate;   Fair Recent;   Fair Remote;   Fair  Judgement:  Fair  Insight:  Fair  Psychomotor Activity:  Normal  Concentration:  Concentration: Fair and Attention Span: Fair  Recall:  [employer] of Knowledge: Fair   Language: Fair  Akathisia:  No  Handed:  Right  AIMS (if indicated): not done  Assets:  Desire for Improvement Housing Social Support Transportation  ADL's:  Intact  Cognition: WNL  Sleep:  Fair   Screenings: AIMS    Flowsheet Row Office Visit from 12/03/2022 in [facility]  AIMS Total Score 0      GAD-7    Flowsheet Row Office Visit from 11/13/2022 in [facility]  Total GAD-7 Score 8      PHQ2-9    Flowsheet Row Office Visit from 12/03/2022 in [facility] Office Visit from 11/13/2022 in [facility] Office Visit from 03/02/2020 in [facility] Office Visit from 12/23/2019 in [facility] Office Visit from 09/23/2019 in [facility]  PHQ-2 Total Score 4 5 6  0 0  PHQ-9 Total Score 11 17 24  -- --      Flowsheet Row Office Visit from 11/13/2022 in [facility]  C-SSRS RISK CATEGORY No Risk        Assessment and Plan: Tamara Garrett is a 57 year old Caucasian female with multiple medical problems including MDD, panic attacks, sensorineural deafness, cognitive issues, neuropathy, chronic back pain, gastric bypass, hypothyroidism was evaluated in office today.  Patient with continued mood symptoms will benefit from dosage readjustment of her psychotropics as well as psychotherapy, plan as noted below.  Plan  MDD with psychosis-unstable Increase Abilify to 5 mg p.o. daily Prozac 40 mg p.o. daily BuSpar 30 mg p.o. twice daily EKG-11/20/2022-QTc-450.  Normal sinus rhythm.  Panic disorder-unstable Continue Prozac 40 mg p.o. daily BuSpar 30 mg p.o. twice daily Clonazepam 0.5 mg take half to 1 tablet as needed for severe panic attacks.  Patient to limit use. Reviewed [place] PMP AWARxE Patient encouraged to establish care with  therapist.   Collaboration of Care: Collaboration of Care: Referral or follow-up with counselor/therapist AEB patient encouraged to establish care with therapist.  Patient/Guardian was advised Release of Information must be obtained prior to any record release in order  to collaborate their care with an outside provider. Patient/Guardian was advised if they have not already done so to contact the registration department to sign all necessary forms in order for [place] to release information regarding their care.   Consent: Patient/Guardian gives verbal consent for treatment and assignment of benefits for services provided during this visit. Patient/Guardian expressed understanding and agreed to proceed.   Follow-up in clinic in 6 weeks or sooner if needed.  This note was generated in part or whole with voice recognition software. Voice recognition is usually quite accurate but there are transcription errors that can and very often do occur. I apologize for any typographical errors that were not detected and corrected.    Jomarie Longs, MD 12/03/2022, 2:00 PM

## 2023-01-03 ENCOUNTER — Other Ambulatory Visit: Payer: Self-pay | Admitting: Psychiatry

## 2023-01-03 DIAGNOSIS — F333 Major depressive disorder, recurrent, severe with psychotic symptoms: Secondary | ICD-10-CM

## 2023-01-07 ENCOUNTER — Encounter: Payer: Self-pay | Admitting: Psychiatry

## 2023-01-07 ENCOUNTER — Telehealth (INDEPENDENT_AMBULATORY_CARE_PROVIDER_SITE_OTHER): Payer: PPO | Admitting: Psychiatry

## 2023-01-07 DIAGNOSIS — F333 Major depressive disorder, recurrent, severe with psychotic symptoms: Secondary | ICD-10-CM

## 2023-01-07 DIAGNOSIS — F410 Panic disorder [episodic paroxysmal anxiety] without agoraphobia: Secondary | ICD-10-CM | POA: Diagnosis not present

## 2023-01-07 MED ORDER — BUSPIRONE HCL 30 MG PO TABS: 30.0000 mg | ORAL_TABLET | Freq: Two times a day (BID) | ORAL | Status: AC

## 2023-01-07 MED ORDER — PROPRANOLOL HCL 10 MG PO TABS: 10.0000 mg | ORAL_TABLET | Freq: Two times a day (BID) | ORAL | 1 refills | Status: DC | PRN

## 2023-01-07 NOTE — Progress Notes (Signed)
Virtual Visit via Video Note  I connected with Tamara Garrett on 01/07/23 at  9:00 AM EST by a video enabled telemedicine application and verified that I am speaking with the correct person using two identifiers.  Location Provider Location : [facility] Patient Location : Home  Participants: Patient , Provider    I discussed the limitations of evaluation and management by telemedicine and the availability of in person appointments. The patient expressed understanding and agreed to proceed.   I discussed the assessment and treatment plan with the patient. The patient was provided an opportunity to ask questions and all were answered. The patient agreed with the plan and demonstrated an understanding of the instructions.   The patient was advised to call back or seek an in-person evaluation if the symptoms worsen or if the condition fails to improve as anticipated.    [facility] MD OP Progress Note  01/07/2023 3:27 PM Tamara Garrett  MRN:  [id number]  Chief Complaint:  Chief Complaint  Patient presents with   Follow-up   Anxiety   Depression   Medication Refill   HPI: Tamara Garrett is a 57 year old Caucasian female on disability, married, lives in [place], has a history of MDD, gastric bypass, panic disorder, sensorineural deafness, cerebrovascular disease/dizziness, chronic nausea, cognitive issues, back pain was evaluated by telemedicine today.  The patient, diagnosed with major depression and panic disorder, reports a general improvement in symptoms since the last consultation on November 12th. The current medication regimen includes Abilify 5mg , Prozac 40mg , Buspar 30mg  twice daily, and Clonazepam 0.5mg  as needed for severe panic attacks. The patient has only needed to use Clonazepam once, which was effective but caused drowsiness.  The patient is currently on two waiting lists for therapy due to limited options with her insurance. Despite this, she reports feeling okay and believes  her depression symptoms are better. She has been sleeping well, although she wakes up early. She denies any suicidal thoughts or thoughts of harming others. Her appetite has been good, and she has not experienced any hallucinations recently.  Most recent visual hallucination was a few days ago and it is getting better on the Abilify.  The patient reports some ongoing memory issues, which have been discussed with her neurologist. She has been seeing the neurologist regularly for a couple of years. The patient also reports visual hallucinations of seeing shadows, which have decreased in frequency and intensity but still occur occasionally.  The patient recently completed a course of Doxycycline prescribed by her primary doctor for unexplained low-grade fevers, possibly related to recent dental work. Since completing the antibiotic course, the patient has not had any more fevers.  Patient denies any suicidality or homicidality.  Patient denies side effects to medications.    Visit Diagnosis:    ICD-10-CM   1. Severe episode of recurrent major depressive disorder, with psychotic features (HCC)  F33.3    improving    2. Panic disorder  F41.0 propranolol (INDERAL) 10 MG tablet      Past Psychiatric History: I have reviewed past psychiatric history from progress note on 11/13/2022.  Past trials of medications like Adderall, Klonopin, Prozac, BuSpar, propranolol, trazodone, venlafaxine, Seroquel.  Past Medical History:  Past Medical History:  Diagnosis Date   Anxiety    Chronic kidney disease    kidney stones   GERD (gastroesophageal reflux disease)     Past Surgical History:  Procedure Laterality Date   ABDOMINAL HYSTERECTOMY     BREAST BIOPSY Left 2005  benign   COLONOSCOPY     COLONOSCOPY WITH PROPOFOL N/A 04/02/2016   Procedure: COLONOSCOPY WITH PROPOFOL;  Surgeon: Christena Deem, MD;  Location[facility] END[facility]COPY;  Service: Endoscopy;  Laterality: N/A;   KIDNEY STONE SURGERY Right     LAPAROSCOPIC GASTRIC BYPASS      Family Psychiatric History: I have reviewed family psychiatric history from progress note on 11/13/2022.  Family History:  Family History  Problem Relation Age of Onset   Colon polyps Mother    COPD Mother    Congestive Heart Failure Mother    Diabetes Mother    Dementia Father    Stroke Father     Social History: I have reviewed social history from progress note on 11/13/2022. Social History   Socioeconomic History   Marital status: Married    Spouse name: Not on file   Number of children: 1   Years of education: Not on file   Highest education level: Some college, no degree  Occupational History   Not on file  Tobacco Use   Smoking status: Every Day    Current packs/day: 1.00    Types: Cigarettes   Smokeless tobacco: Never  Vaping Use   Vaping status: Former  Substance and Sexual Activity   Alcohol use: No   Drug use: No   Sexual activity: Not Currently  Other Topics Concern   Not on file  Social History Narrative   Not on file   Social Drivers of Health   Financial Resource Strain: Medium Risk (08/08/2021)   Received from [facility]   Overall Financial Resource Strain (CARDIA)    Difficulty of Paying Living Expenses: Somewhat hard  Food Insecurity: No Food Insecurity (08/08/2021)   Received from [facility]   Hunger Vital Sign    Worried About Running Out of Food in the Last Year: Never true    Ran Out of Food in the Last Year: Never true  Transportation Needs: No Transportation Needs (08/08/2021)   Received from [facility]   PRAPARE - Transportation    Lack of Transportation (Medical): No    Lack of Transportation (Non-Medical): No  Physical Activity: Not on file  Stress: Not on file  Social Connections: Not on file    Allergies:  Allergies  Allergen Reactions   Nsaids Other (See Comments)    Patient has had gastric bypass.     Other     Had Gastric Bypass   Sulfamethoxazole Other (See Comments)    Sulfamethoxazole-Trimethoprim Other (See Comments)   Trimethoprim Other (See Comments)    Metabolic Disorder Labs: Lab Results  Component Value Date   HGBA1C 5.6 05/28/2019   No results found for: "PROLACTIN" Lab Results  Component Value Date   CHOL 217 (H) 05/28/2019   TRIG 236 (H) 05/28/2019   HDL 36 (L) 05/28/2019   LDLCALC 138 (H) 05/28/2019   Lab Results  Component Value Date   TSH 2.340 01/21/2019    Therapeutic Level Labs: No results found for: "LITHIUM" No results found for: "VALPROATE" No results found for: "CBMZ"  Current Medications: Current Outpatient Medications  Medication Sig Dispense Refill   ARIPiprazole (ABILIFY) 5 MG tablet TAKE 1 TABLET (5 MG TOTAL) BY MOUTH IN THE MORNING 90 tablet 1   aspirin EC 81 MG tablet Take 1 tablet by mouth daily.     clonazePAM (KLONOPIN) 0.5 MG tablet Take 0.5-1 tablets (0.25-0.5 mg total) by mouth as directed. Take half to one tablet once a day  as needed for severe panic attacks only, please give atleast 8 hours gap with your oxycodone medication, do not combine. 8 tablet 0   diphenoxylate-atropine (LOMOTIL) 2.5-0.025 MG tablet Take 1 tablet by mouth 4 (four) times daily as needed for diarrhea or loose stools. 30 tablet 0   doxycycline (VIBRAMYCIN) 100 MG capsule Take by mouth.     FLUoxetine (PROZAC) 40 MG capsule Take 2 capsules by mouth daily.     gabapentin (NEURONTIN) 600 MG tablet 600 mg. Take 1 tab 3-4 times daily.     levothyroxine (SYNTHROID) 50 MCG tablet Take 50 mcg by mouth daily.     omeprazole (PRILOSEC) 20 MG capsule TAKE 1 CAPSULE BY MOUTH EVERY DAY 90 capsule 1   oxyCODONE-acetaminophen (PERCOCET/ROXICET) 5-325 MG tablet Take 1 tablet by mouth every 8 (eight) hours as needed for severe pain.     promethazine (PHENERGAN) 12.5 MG tablet Take by mouth.     rosuvastatin (CRESTOR) 20 MG tablet TAKE 1 TABLET BY MOUTH EVERY DAY 90 tablet 1   traZODone (DESYREL) 50 MG tablet Take 50 mg by mouth at bedtime as  needed.     busPIRone (BUSPAR) 30 MG tablet Take 1 tablet (30 mg total) by mouth 2 (two) times daily.     dicyclomine (BENTYL) 10 MG capsule TAKE ONE CAPSULE BY MOUTH 3 TIMES A DAY AS NEEDED FOR CRAMPING (Patient not taking: Reported on 01/07/2023)     fluconazole (DIFLUCAN) 150 MG tablet Take 1 tablet (150 mg total) by mouth every three (3) days as needed. (Patient not taking: Reported on 01/07/2023) 3 tablet 0   ondansetron (ZOFRAN) 8 MG tablet Take 1 tablet (8 mg total) by mouth every 8 (eight) hours as needed for nausea or vomiting. (Patient not taking: Reported on 01/07/2023) 30 tablet 2   propranolol (INDERAL) 10 MG tablet Take 1 tablet (10 mg total) by mouth 2 (two) times daily as needed. For anxiety 180 tablet 1   No current facility-administered medications for this visit.     Musculoskeletal: Strength & Muscle Tone:  UTA Gait & Station:  Seated Patient leans: N/A  Psychiatric Specialty Exam: Review of Systems  Psychiatric/Behavioral:  Positive for hallucinations.     There were no vitals taken for this visit.There is no height or weight on file to calculate BMI.  General Appearance: Fairly Groomed  Eye Contact:  Fair  Speech:  Clear and Coherent  Volume:  Normal  Mood:  Euthymic  Affect:  Congruent  Thought Process:  Goal Directed and Descriptions of Associations: Intact  Orientation:  Full (Time, Place, and Person)  Thought Content: Hallucinations: Visual shadows - few days ago - improved  Suicidal Thoughts:  No  Homicidal Thoughts:  No  Memory:  Immediate;   Fair Recent;   Fair Remote;   Fair  Judgement:  Fair  Insight:  Fair  Psychomotor Activity:  Normal  Concentration:  Concentration: Fair and Attention Span: Fair  Recall:  [employer] of Knowledge: Fair  Language: Fair  Akathisia:  No  Handed:  Right  AIMS (if indicated): not done  Assets:  Communication Skills Desire for Improvement Housing Social Support Transportation  ADL's:  Intact  Cognition:  WNL  Sleep:  Fair   Screenings: Geneticist, molecular Office Visit from [facility]/03/2022 in [facility]  AIMS Total Score 0      GAD-7    Flowsheet Row Office Visit from 11/13/2022 in [facility]  Total GAD-7 Score 8      PHQ2-9    Flowsheet Row Office Visit from 12/03/2022 in [facility] Office Visit from 11/13/2022 in [facility] Office Visit from 03/02/2020 in [facility] Office Visit from 12/23/2019 in [facility] Office Visit from 09/23/2019 in [facility]  PHQ-2 Total Score 4 5 6  0 0  PHQ-9 Total Score 11 17 24  -- --      Flowsheet Row Video Visit from 01/07/2023 in [facility] Office Visit from 12/03/2022 in [facility] Office Visit from 11/13/2022 in [facility]  C-SSRS RISK CATEGORY No Risk No Risk No Risk        Assessment and Plan: Tamara Garrett is a 57 year old Caucasian female with multiple medical problems including MDD, panic attacks, sensorineural deafness, cognitive issues, neuropathy, multiple other medical problem was evaluated by telemedicine today.  Patient with improvement on the current medication regimen, discussed assessment and plan as noted below.  Major Depression-improving Improvement in depression symptoms with current medication regimen. No suicidal ideation or changes in appetite. Visual hallucinations of seeing shadows persist but are less frequent and less bothersome. Good motivation to engage in activities. Persistent memory issues with normal thyroid and B12 levels. On waiting lists for therapists in [place] and [place]. Further evaluation by neurologist if memory issues persist despite treatment. - Continue  Abilify 5 mg daily - Continue Prozac 40 mg daily - Continue Buspar 30 mg twice daily - Continue trazodone 50 mg at bedtime as needed for sleep - Refer to in-house therapist for psychotherapy - Follow up with neurologist regarding memory issues  Panic Disorder-improving Used clonazepam once, effective but caused significant drowsiness. Discussed potential drowsiness and advised considering a half dose if needed. - Continue clonazepam 0.5 mg as needed for severe panic attacks - Continue propranolol 10 mg twice daily however changed to as needed. - Educate on potential drowsiness with clonazepam and consider using a half dose if needed  General Health Maintenance Completed doxycycline for unexplained fevers, possibly related to recent dental work. No new allergies or changes in medical problems. Taking multiple medications for various conditions.  Follow-up - Schedule follow-up appointment for March 06, 2023, at 4:40 PM via video. - Discussed with staff to schedule this patient with in-house therapist.   Collaboration of Care: Collaboration of Care: Referral or follow-up with counselor/therapist AEB patient encouraged to establish care.  Patient/Guardian was advised Release of Information must be obtained prior to any record release in order to collaborate their care with an outside provider. Patient/Guardian was advised if they have not already done so to contact the registration department to sign all necessary forms in order for [place] to release information regarding their care.   Consent: Patient/Guardian gives verbal consent for treatment and assignment of benefits for services provided during this visit. Patient/Guardian expressed understanding and agreed to proceed.   This note was generated in part or whole with voice recognition software. Voice recognition is usually quite accurate but there are transcription errors that can and very often do occur. I apologize for any typographical  errors that were not detected and corrected.    Jomarie Longs, MD 01/07/2023, 3:27 PM

## 2023-02-20 ENCOUNTER — Ambulatory Visit (INDEPENDENT_AMBULATORY_CARE_PROVIDER_SITE_OTHER): Payer: PPO | Admitting: Professional Counselor

## 2023-02-20 DIAGNOSIS — F4323 Adjustment disorder with mixed anxiety and depressed mood: Secondary | ICD-10-CM
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
## 2023-03-03 ENCOUNTER — Ambulatory Visit (INDEPENDENT_AMBULATORY_CARE_PROVIDER_SITE_OTHER): Payer: PPO | Admitting: Professional Counselor

## 2023-03-03 DIAGNOSIS — F4323 Adjustment disorder with mixed anxiety and depressed mood: Secondary | ICD-10-CM | POA: Diagnosis not present

## 2023-03-03 NOTE — Progress Notes (Signed)
 THERAPIST PROGRESS NOTE  Virtual Visit via Video Note  I connected with Jodine Munster on 03/03/23 at  9:00 AM EST by a video enabled telemedicine application and verified that I am speaking with the correct person using two identifiers.  Location: Patient: Home Provider: Office   I discussed the limitations of evaluation and management by telemedicine and the availability of in person appointments. The patient expressed understanding and agreed to proceed.  I discussed the assessment and treatment plan with the patient. The patient was provided an opportunity to ask questions and all were answered. The patient agreed with the plan and demonstrated an understanding of the instructions.   The patient was advised to call back or seek an in-person evaluation if the symptoms worsen or if the condition fails to improve as anticipated.  I provided 40 minutes of non-face-to-face time during this encounter Len Quale, [facility]  Session Time: 9:01 AM - 9:41 AM   Participation Level: Active  Behavioral Response: Casual, Alert, Dysphoric  Type of Therapy: Individual Therapy  Treatment Goals addressed: Active OP Depression  LTG: "I'm just stuck in this setting, in this chair, and in this depression, even with medication I haven't been able to pull myself out of it. I give up so easy now. I used to be the main person in our family that stood up and took over everything."    Start:  03/03/23    Expected End:  03/01/24     STG: "I really need to focus on getting my house back in order. I just let it go and I do the bare minimum." To manage ADLs AEB utilizing coping strategies to complete tasks on a daily basis over the next 90 days.    STG: "I don't let things out. I just say everything's okay, everything's fine." To acknowledge and process emotions AEB engaging in talk therapy and sharing thoughts/feelings within personal relationships over the next 12 weeks.    ProgressTowards  Goals: Initial  Interventions: CBT  Summary: Makeda Ridley is a 58 y.o. female who presents with adjustment disorder. She stated things are about the same. She visited with her grandchildren recently and noted this is always a positive experience. Paige engaged in developing her treatment plan. She provided verbal consent to sign consent form. She was receptive to chunking as it might help with getting things done. Nayana engaged in writing exercise. She was receptive to types of cognitive distortions and was able to restructure her thought patterns with assistance. She noted how her initial thoughts make her feel "useless" and the restructured thoughts feel more hopeful. She was in agreement to keep a thought log and practice this exercise on her own between now and next session.     Therapist Response: Conducted telehealth session with [name]. Began session with check-in/update since previous session. Utilized empathetic and reflective listening. Developed treatment plan with input from [facility] on current strengths, needs, and progress towards goals. Explained chunking to help with task completion. Engaged in writing exercise, "Cracking the NUTS and eliminating the ANTS. Provided psychoeducation on cognitive model and cognitive distortions. Assisted with identifying and restructuring Ladeana's thoughts. Emailed copy of handout so Aziza can practice between now and next session. Confirmed next session and concluded session.   Suicidal/Homicidal: No  Plan: Return again in 2 weeks.  Diagnosis: Adjustment disorder with mixed anxiety and depressed mood  Collaboration of Care: Medication Management AEB chart review  Patient/Guardian was advised Release of Information must be obtained  prior to any record release in order to collaborate their care with an outside provider. Patient/Guardian was advised if they have not already done so to contact the registration department to sign all necessary forms in order  for us  to release information regarding their care.   Consent: Patient/Guardian gives verbal consent for treatment and assignment of benefits for services provided during this visit. Patient/Guardian expressed understanding and agreed to proceed.   Len Quale, [facility] 03/03/2023

## 2023-03-06 ENCOUNTER — Telehealth: Payer: PPO | Admitting: Psychiatry

## 2023-03-06 ENCOUNTER — Encounter: Payer: Self-pay | Admitting: Psychiatry

## 2023-03-06 DIAGNOSIS — F333 Major depressive disorder, recurrent, severe with psychotic symptoms: Secondary | ICD-10-CM | POA: Diagnosis not present

## 2023-03-06 DIAGNOSIS — F410 Panic disorder [episodic paroxysmal anxiety] without agoraphobia: Secondary | ICD-10-CM

## 2023-03-06 MED ORDER — FLUOXETINE HCL 20 MG PO CAPS: 20.0000 mg | ORAL_CAPSULE | Freq: Every day | ORAL | 1 refills | Status: DC

## 2023-03-06 NOTE — Progress Notes (Unsigned)
Virtual Visit via Video Note  I connected with Tamara Garrett on 03/06/23 at  4:40 PM EST by a video enabled telemedicine application and verified that I am speaking with the correct person using two identifiers.  Location Provider Location : [facility] Patient Location : Home  Participants: Patient , Provider    I discussed the limitations of evaluation and management by telemedicine and the availability of in person appointments. The patient expressed understanding and agreed to proceed.   I discussed the assessment and treatment plan with the patient. The patient was provided an opportunity to ask questions and all were answered. The patient agreed with the plan and demonstrated an understanding of the instructions.   The patient was advised to call back or seek an in-person evaluation if the symptoms worsen or if the condition fails to improve as anticipated.   BH MD OP Progress Note  03/07/2023 1:27 PM Lanetra Hartley  MRN:  [id number]  Chief Complaint:  Chief Complaint  Patient presents with   Follow-up   Anxiety   Depression   Medication Refill   HPI: [name] is a 58 year old Caucasian female on disability, married, lives in [place] has a history of MDD, panic disorder, gastric bypass, sensorineural deafness, cerebrovascular disease/dizziness, chronic nausea, cognitive issues, back pain was evaluated by telemedicine today.  She has a history of depression and anxiety, currently managed with Abilify, Prozac, and Buspar. These medications have improved her panic attacks, though she still experiences some. She feels 'very depressed' and has cognitive distortions, such as negative self-talk about failing her family. She has started therapy with Ms.Elizabeth Ganey and has had two video visits, which she finds helpful.  She is also taking clonazepam and propranolol as needed for anxiety symptoms/panic attacks.  Limits the use of clonazepam and is aware of drug-drug  interaction with other medications including oxycodone.  She is also taking trazodone,and reports sleeping well.  Denies any side effects.  She has switched from oxycodone to hydrocodone for pain management, as advised by her back doctor. She has been on pain medication for thirteen years. She continues to smoke.  She started Macrobid yesterday for a urinary tract infection (UTI). She has been on this antibiotic before for UTIs and notes that it helps, although it has upset her stomach a little. She was prescribed a five-day supply.  She currently denies any suicidality, homicidality or perceptual disturbances.  Visit Diagnosis:    ICD-10-CM   1. Severe episode of recurrent major depressive disorder, with psychotic features (HCC)  F33.3 FLUoxetine (PROZAC) 20 MG capsule   improving    2. Panic disorder  F41.0 FLUoxetine (PROZAC) 20 MG capsule      Past Psychiatric History: I have reviewed past psychiatric history from progress note on 11/13/2022.  Past trials of medications like Adderall, Klonopin, Prozac, BuSpar, propranolol, trazodone, venlafaxine, Seroquel.  Past Medical History:  Past Medical History:  Diagnosis Date   Anxiety    Chronic kidney disease    kidney stones   GERD (gastroesophageal reflux disease)     Past Surgical History:  Procedure Laterality Date   ABDOMINAL HYSTERECTOMY     BREAST BIOPSY Left 2005   benign   COLONOSCOPY     COLONOSCOPY WITH PROPOFOL N/A 04/02/2016   Procedure: COLONOSCOPY WITH PROPOFOL;  Surgeon: Christena Deem, MD;  Location: [facility] ENDOSCOPY;  Service: Endoscopy;  Laterality: N/A;   KIDNEY STONE SURGERY Right    LAPAROSCOPIC GASTRIC BYPASS      Family  Psychiatric History: I have reviewed family psychiatric history from progress note on 11/13/2022.  Family History:  Family History  Problem Relation Age of Onset   Colon polyps Mother    COPD Mother    Congestive Heart Failure Mother    Diabetes Mother    Dementia Father     Stroke Father     Social History: I have reviewed social history from progress note on 11/13/2022. Social History   Socioeconomic History   Marital status: Married    Spouse name: Not on file   Number of children: 1   Years of education: Not on file   Highest education level: Some college, no degree  Occupational History   Not on file  Tobacco Use   Smoking status: Every Day    Current packs/day: 1.00    Types: Cigarettes   Smokeless tobacco: Never  Vaping Use   Vaping status: Former  Substance and Sexual Activity   Alcohol use: No   Drug use: No   Sexual activity: Not Currently  Other Topics Concern   Not on file  Social History Narrative   Not on file   Social Drivers of Health   Financial Resource Strain: High Risk (02/20/2023)   Overall Financial Resource Strain (CARDIA)    Difficulty of Paying Living Expenses: Hard  Food Insecurity: No Food Insecurity (02/20/2023)   Hunger Vital Sign    Worried About Running Out of Food in the Last Year: Never true    Ran Out of Food in the Last Year: Never true  Transportation Needs: Unmet Transportation Needs (02/20/2023)   PRAPARE - Transportation    Lack of Transportation (Medical): Yes    Lack of Transportation (Non-Medical): Yes  Physical Activity: Insufficiently Active (02/20/2023)   Exercise Vital Sign    Days of Exercise per Week: 1 day    Minutes of Exercise per Session: 10 min  Stress: Stress Concern Present (02/20/2023)   [employer] of Occupational Health - Occupational Stress Questionnaire    Feeling of Stress : Very much  Social Connections: Socially Isolated (02/20/2023)   Social Connection and Isolation Panel [NHANES]    Frequency of Communication with Friends and Family: Twice a week    Frequency of Social Gatherings with Friends and Family: Never    Attends Religious Services: Never    Database administrator or Organizations: No    Attends Banker Meetings: Never    Marital Status: Married     Allergies:  Allergies  Allergen Reactions   Nsaids Other (See Comments)    Patient has had gastric bypass.     Other     Had Gastric Bypass   Sulfamethoxazole Other (See Comments)   Sulfamethoxazole-Trimethoprim Other (See Comments)   Trimethoprim Other (See Comments)    Metabolic Disorder Labs: Lab Results  Component Value Date   HGBA1C 5.6 05/28/2019   No results found for: "PROLACTIN" Lab Results  Component Value Date   CHOL 217 (H) 05/28/2019   TRIG 236 (H) 05/28/2019   HDL 36 (L) 05/28/2019   LDLCALC 138 (H) 05/28/2019   Lab Results  Component Value Date   TSH 2.340 01/21/2019    Therapeutic Level Labs: No results found for: "LITHIUM" No results found for: "VALPROATE" No results found for: "CBMZ"  Current Medications: Current Outpatient Medications  Medication Sig Dispense Refill   FLUoxetine (PROZAC) 20 MG capsule Take 1 capsule (20 mg total) by mouth daily. Take along with 40 mg daily ( has supplies  of 40 mg ) 90 capsule 1   HYDROcodone-acetaminophen (NORCO) 10-325 MG tablet take 1 tablet by oral route  every 8 hours as needed for pain (DNF 04/21/23)     nitrofurantoin, macrocrystal-monohydrate, (MACROBID) 100 MG capsule Take by mouth.     phenazopyridine (PYRIDIUM) 200 MG tablet Take by mouth.     ARIPiprazole (ABILIFY) 5 MG tablet TAKE 1 TABLET (5 MG TOTAL) BY MOUTH IN THE MORNING 90 tablet 1   aspirin EC 81 MG tablet Take 1 tablet by mouth daily.     busPIRone (BUSPAR) 30 MG tablet Take 1 tablet (30 mg total) by mouth 2 (two) times daily.     clonazePAM (KLONOPIN) 0.5 MG tablet Take 0.5-1 tablets (0.25-0.5 mg total) by mouth as directed. Take half to one tablet once a day as needed for severe panic attacks only, please give atleast 8 hours gap with your oxycodone medication, do not combine. 8 tablet 0   dicyclomine (BENTYL) 10 MG capsule TAKE ONE CAPSULE BY MOUTH 3 TIMES A DAY AS NEEDED FOR CRAMPING (Patient not taking: Reported on 01/07/2023)      diphenoxylate-atropine (LOMOTIL) 2.5-0.025 MG tablet Take 1 tablet by mouth 4 (four) times daily as needed for diarrhea or loose stools. 30 tablet 0   fluconazole (DIFLUCAN) 150 MG tablet Take 1 tablet (150 mg total) by mouth every three (3) days as needed. (Patient not taking: Reported on 01/07/2023) 3 tablet 0   FLUoxetine (PROZAC) 40 MG capsule Take 2 capsules by mouth daily.     gabapentin (NEURONTIN) 600 MG tablet 600 mg. Take 1 tab 3-4 times daily.     levothyroxine (SYNTHROID) 50 MCG tablet Take 50 mcg by mouth daily.     omeprazole (PRILOSEC) 20 MG capsule TAKE 1 CAPSULE BY MOUTH EVERY DAY 90 capsule 1   ondansetron (ZOFRAN) 8 MG tablet Take 1 tablet (8 mg total) by mouth every 8 (eight) hours as needed for nausea or vomiting. (Patient not taking: Reported on 01/07/2023) 30 tablet 2   promethazine (PHENERGAN) 12.5 MG tablet Take by mouth.     propranolol (INDERAL) 10 MG tablet Take 1 tablet (10 mg total) by mouth 2 (two) times daily as needed. For anxiety 180 tablet 1   rosuvastatin (CRESTOR) 20 MG tablet TAKE 1 TABLET BY MOUTH EVERY DAY 90 tablet 1   traZODone (DESYREL) 50 MG tablet Take 50 mg by mouth at bedtime as needed.     No current facility-administered medications for this visit.     Musculoskeletal: Strength & Muscle Tone:  UTA Gait & Station:  Seated Patient leans: N/A  Psychiatric Specialty Exam: Review of Systems  Psychiatric/Behavioral:  Positive for dysphoric mood.     There were no vitals taken for this visit.There is no height or weight on file to calculate BMI.  General Appearance: Casual  Eye Contact:  Fair  Speech:  Clear and Coherent  Volume:  Normal  Mood:  Depressed  Affect:  Congruent  Thought Process:  Goal Directed and Descriptions of Associations: Intact  Orientation:  Full (Time, Place, and Person)  Thought Content: Logical   Suicidal Thoughts:  No  Homicidal Thoughts:  No  Memory:  Immediate;   Fair Recent;   Fair Remote;   Fair  Judgement:   Fair  Insight:  Fair  Psychomotor Activity:  Normal  Concentration:  Concentration: Fair and Attention Span: Fair  Recall:  [employer] of Knowledge: Fair  Language: Fair  Akathisia:  No  Handed:  Right  AIMS (if indicated): not done  Assets:  Desire for Improvement Housing Social Support Transportation  ADL's:  Intact  Cognition: WNL  Sleep:  Fair   Screenings: AIMS    Flowsheet Row Office Visit from 12/03/2022 in [facility]  AIMS Total Score 0      GAD-7    Flowsheet Row Counselor from 02/20/2023 in [facility] Office Visit from 11/13/2022 in [facility]  Total GAD-7 Score 7 8      PHQ2-9    Flowsheet Row Counselor from 02/20/2023 in [facility] Office Visit from 12/03/2022 in [facility] Office Visit from 11/13/2022 in [facility] Office Visit from 03/02/2020 in [facility] Office Visit from 12/23/2019 in [facility]  PHQ-2 Total Score 6 4 5 6  0  PHQ-9 Total Score 18 11 17 24  --      Flowsheet Row Video Visit from 03/06/2023 in [facility] Counselor from 02/20/2023 in [facility] Video Visit from 01/07/2023 in [facility]  C-SSRS RISK CATEGORY No Risk No Risk No Risk        Assessment and Plan: Tamara Garrett is a 58 year old Caucasian female with multiple medical problems including MDD, panic attacks, sensorineural deafness, cognitive issues, neuropathy, currently struggling with a UTI on Macrobid, continues to have depression symptoms, discussed assessment and plan as noted below.  Major Depressive Disorder-unstable Ongoing depressive symptoms despite current  regimen (Abilify, Prozac, Buspar). Symptoms include feelings of failure and negative self-talk. Started cognitive behavioral therapy with [facility], two video visits completed. No suicidal ideation. Discussed increasing Prozac to 60 mg daily post-UTI treatment to avoid confounding side effects. - Increase Prozac to 60 mg daily after completing antibiotic course and waiting one week - Continue Abilify 5 mg daily - Continue BuSpar 30 mg twice daily. - Continue Trazodone 50 mg at bedtime as needed for sleep - Continue therapy with [facility] - Monitor for side effects and efficacy of increased Prozac dosage  Panic disorder-improving Improvement in panic attacks with current regimen (Prozac, Buspar). Anxiety better controlled. - Continue current medications (Prozac, Buspar) - Continue Clonazepam 0.5 mg as needed for severe panic attacks - Continue Propranolol 10 mg twice a day as needed for severe anxiety attacks. - Monitor for any changes in anxiety symptoms - Continue CBT   Follow-up - Follow-up appointment on March 27th at 4:20 PM via video.  Collaboration of Care: Collaboration of Care: Referral or follow-up with counselor/therapist AEB patient encouraged to continue CBT has upcoming appointment.  Patient/Guardian was advised Release of Information must be obtained prior to any record release in order to collaborate their care with an outside provider. Patient/Guardian was advised if they have not already done so to contact the registration department to sign all necessary forms in order for [place] to release information regarding their care.   Consent: Patient/Guardian gives verbal consent for treatment and assignment of benefits for services provided during this visit. Patient/Guardian expressed understanding and agreed to proceed.   This note was generated in part or whole with voice recognition software. Voice recognition is usually quite accurate but there are transcription  errors that can and very often do occur. I apologize for any typographical errors that were not detected and corrected.    Jomarie Longs, MD 03/07/2023,  1:27 PM

## 2023-03-17 ENCOUNTER — Ambulatory Visit (INDEPENDENT_AMBULATORY_CARE_PROVIDER_SITE_OTHER): Payer: PPO | Admitting: Professional Counselor

## 2023-03-17 DIAGNOSIS — F4323 Adjustment disorder with mixed anxiety and depressed mood: Secondary | ICD-10-CM

## 2023-03-17 NOTE — Progress Notes (Signed)
 THERAPIST PROGRESS NOTE  Virtual Visit via Video Note  I connected with Tamara Garrett on 03/17/23 at 10:00 AM EST by a video enabled telemedicine application and verified that I am speaking with the correct person using two identifiers.  Location: Patient: Home Provider: Office   I discussed the limitations of evaluation and management by telemedicine and the availability of in person appointments. The patient expressed understanding and agreed to proceed.  I discussed the assessment and treatment plan with the patient. The patient was provided an opportunity to ask questions and all were answered. The patient agreed with the plan and demonstrated an understanding of the instructions.   The patient was advised to call back or seek an in-person evaluation if the symptoms worsen or if the condition fails to improve as anticipated.  I provided 51 minutes of non-face-to-face time during this encounter. Edmonia Lynch, [facility]  Session Time: 10:01 AM - 10:52 AM  Participation Level: Active  Behavioral Response: Casual, Alert, Dysphoric  Type of Therapy: Individual Therapy  Treatment Goals addressed: Active OP Depression  LTG: "I'm just stuck in this setting, in this chair, and in this depression, even with medication I haven't been able to pull myself out of it. I give up so easy now. I used to be the main person in our family that stood up and took over everything."                Start:  03/03/23    Expected End:  03/01/24      STG: "I really need to focus on getting my house back in order. I just let it go and I do the bare minimum." To manage ADLs AEB utilizing coping strategies to complete tasks on a daily basis over the next 90 days.     STG: "I don't let things out. I just say everything's okay, everything's fine." To acknowledge and process emotions AEB engaging in talk therapy and sharing thoughts/feelings within personal relationships over the next 12 weeks.     ProgressTowards Goals: Progressing  Interventions: CBT, Motivational Interviewing, and Solution Focused  Summary: Tamara Garrett is a 58 y.o. female who presents with a history of anxiety and depression. She appeared dysphoric but oriented x5. She stated she might be coming down with a cold. She stated she went to her sister's house and helped her out and then she went with her husband to her sister-in-law's for dinner. She reported she hasn't done that in a long time. She discussed her health/dizzy spells and the impact on her life. She noted smoking cessation may help but she has struggled to quit. She has been able to reduce from 1 pack to 1/2 pack daily. [name] engaged in discussion and was receptive to things that may be helpful. She discussed thoughts around smoking and also around getting out of the house. She agreed there is a fear of having spells around family. She was in agreement to conduct exercise next session. She noted getting a partial tooth implant has probably also helped with getting out of the house because she doesn't feel insecure anymore about a missing tooth.   Therapist Response: Conducted telehealth session with [name]. Began session with check-in/update since previous session. Utilized empathetic and reflective listening. Discussed impact of health issues on current functioning. Explored smoking cessation and explained urge surfing to reduce smoking. Processed [name] thoughts around smoking and pros/cons of quitting. Discussed exposure to getting out of house to reduce fear of dizzy spells  and episodes of depression at home. Identified exercise to conduct next session (donut/circles of control). Scheduled additional appointment and concluded session.  Suicidal/Homicidal: No  Plan: Return again in 2 weeks.  Diagnosis: Adjustment disorder with mixed anxiety and depressed mood  Collaboration of Care: Medication Management AEB chart review  Patient/Guardian was advised  Release of Information must be obtained prior to any record release in order to collaborate their care with an outside provider. Patient/Guardian was advised if they have not already done so to contact the registration department to sign all necessary forms in order for [place] to release information regarding their care.   Consent: Patient/Guardian gives verbal consent for treatment and assignment of benefits for services provided during this visit. Patient/Guardian expressed understanding and agreed to proceed.   Edmonia Lynch, [facility] 03/17/2023

## 2023-03-26 ENCOUNTER — Other Ambulatory Visit: Payer: Self-pay | Admitting: Psychiatry

## 2023-03-26 DIAGNOSIS — F410 Panic disorder [episodic paroxysmal anxiety] without agoraphobia: Secondary | ICD-10-CM

## 2023-03-31 ENCOUNTER — Ambulatory Visit (INDEPENDENT_AMBULATORY_CARE_PROVIDER_SITE_OTHER): Payer: Self-pay | Admitting: Professional Counselor

## 2023-03-31 DIAGNOSIS — F4323 Adjustment disorder with mixed anxiety and depressed mood: Secondary | ICD-10-CM

## 2023-03-31 NOTE — Progress Notes (Signed)
 THERAPIST PROGRESS NOTE  Virtual Visit via Video Note  I connected with Tamara Garrett on 03/31/23 at  9:00 AM EDT by a video enabled telemedicine application and verified that I am speaking with the correct person using two identifiers.  Location: Patient: Home Provider: Office   I discussed the limitations of evaluation and management by telemedicine and the availability of in person appointments. The patient expressed understanding and agreed to proceed.   I discussed the assessment and treatment plan with the patient. The patient was provided an opportunity to ask questions and all were answered. The patient agreed with the plan and demonstrated an understanding of the instructions.   The patient was advised to call back or seek an in-person evaluation if the symptoms worsen or if the condition fails to improve as anticipated.  I provided 34 minutes of non-face-to-face time during this encounter. Edmonia Lynch, [facility]  Session Time: 9:01 AM - 9:35 AM   Participation Level: Active  Behavioral Response: Casual, Alert, Anxious  Type of Therapy: Individual Therapy  Treatment Goals addressed: Active OP Depression  LTG: "I'm just stuck in this setting, in this chair, and in this depression, even with medication I haven't been able to pull myself out of it. I give up so easy now. I used to be the main person in our family that stood up and took over everything."                Start:  03/03/23    Expected End:  03/01/24      STG: "I really need to focus on getting my house back in order. I just let it go and I do the bare minimum." To manage ADLs AEB utilizing coping strategies to complete tasks on a daily basis over the next 90 days.     STG: "I don't let things out. I just say everything's okay, everything's fine." To acknowledge and process emotions AEB engaging in talk therapy and sharing thoughts/feelings within personal relationships over the next 12 weeks.     ProgressTowards Goals: Progressing  Interventions: CBT  Summary: Tamara Garrett is a 58 y.o. female who presents with a history of depression and anxiety.  She reported she just woke up and was still little tired.  Tamara Garrett reported she maintained some activity this previous week visiting with family and going into town.  She noted a minor panic attack while in the drive-through at this [employer].  Tamara Garrett was receptive to reviewing basic coping skills and provided her email to receive copy of handouts.  She expressed understanding about the difference between natural versus manufactured emotions.  She engaged in some discussion about how her thinking may contribute to her emotions and agree that she needs to work on some of those thoughts to help with some changes Tamara Garrett is looking forward to her beach trip and hopes that the skills will be helpful in maintaining a grounded state.  Therapist Response: Conducted session with [name]. Began session with check-in/update since previous session. Utilized empathetic and reflective listening.  Provided psychoeducation on the cycle of avoidance/anxiety trap. Reviewed basic coping skills such as breathing exercises and 5-4-3-2-1.  Explained the difference between natural versus manufactured emotions and the cognitive model.  Engaged in discussion to help Tamara Garrett identify her thoughts and how they contribute to her emotions.  Used Socratic questioning to help challenge negative thinking.  Encouraged Tamara Garrett to [employer] before upcoming trip and during trip.  Scheduled additional appointment and concluded  session. Emailed copies of skills handouts.  Suicidal/Homicidal: No  Plan: Return again in 3 weeks.  Diagnosis: Adjustment disorder with mixed anxiety and depressed mood  Collaboration of Care: Medication Management AEB chart review  Patient/Guardian was advised Release of Information must be obtained prior to any record release in order to collaborate their  care with an outside provider. Patient/Guardian was advised if they have not already done so to contact the registration department to sign all necessary forms in order for [place] to release information regarding their care.   Consent: Patient/Guardian gives verbal consent for treatment and assignment of benefits for services provided during this visit. Patient/Guardian expressed understanding and agreed to proceed.   Edmonia Lynch, [facility] 03/31/2023

## 2023-04-17 ENCOUNTER — Telehealth: Payer: PPO | Admitting: Psychiatry

## 2023-04-17 ENCOUNTER — Encounter: Payer: Self-pay | Admitting: Psychiatry

## 2023-04-17 DIAGNOSIS — F333 Major depressive disorder, recurrent, severe with psychotic symptoms: Secondary | ICD-10-CM | POA: Diagnosis not present

## 2023-04-17 DIAGNOSIS — F410 Panic disorder [episodic paroxysmal anxiety] without agoraphobia: Secondary | ICD-10-CM

## 2023-04-17 NOTE — Progress Notes (Unsigned)
 Virtual Visit via Video Note  I connected with Tamara Garrett on 04/17/23 at  4:20 PM EDT by a video enabled telemedicine application and verified that I am speaking with the correct person using two identifiers.  Location Provider Location : [facility] Patient Location : Home  Participants: Patient , Provider    I discussed the limitations of evaluation and management by telemedicine and the availability of in person appointments. The patient expressed understanding and agreed to proceed.   I discussed the assessment and treatment plan with the patient. The patient was provided an opportunity to ask questions and all were answered. The patient agreed with the plan and demonstrated an understanding of the instructions.   The patient was advised to call back or seek an in-person evaluation if the symptoms worsen or if the condition fails to improve as anticipated.   BH MD OP Progress Note  04/18/2023 12:16 PM Tamara Garrett  MRN:  [id number]  Chief Complaint:  Chief Complaint  Patient presents with   Depression   Anxiety   Medication Refill   Follow-up   HPI: Tamara Garrett is a 58 year old Caucasian female on disability, married, lives in [place], has a history of MDD, panic disorder, gastric bypass, sensorineural deafness, cerebrovascular disease/dizziness, recurrent UTIs, chronic nausea, cognitive issues, back pain was evaluated by telemedicine today.  She is managing depression and anxiety with Prozac, which was increased to 60 mg. Despite the increase, there is no significant change in depressive symptoms, though panic attacks have become less frequent and less intense.  She also takes Buspar 30 mg twice daily.  Dizzy spells occur suddenly, preventing her from going out unless accompanied by someone. These spells make her feel 'stuck in the house,' impacting mood and daily activities.  She experiences recurrent urinary tract infections (UTIs). A UTI from the last visit  resolved, but another has developed, and she is currently on Keflex.  She switched from oxycodone to hydrocodone 10 mg for pain management, which is working well.  She reports an adequate appetite, having gained 15 pounds after previously losing 130 pounds, which she felt was too much for her frame.  She currently denies any suicidality, homicidality or perceptual disturbances.  She is currently in psychotherapy sessions with Ms. Pricilla Loveless and agreeable to continue therapy sessions which are beneficial.    Visit Diagnosis:    ICD-10-CM   1. Severe episode of recurrent major depressive disorder, with psychotic features (HCC)  F33.3     2. Panic disorder  F41.0       Past Psychiatric History: I have reviewed past psychiatric history from progress note on 11/13/2022.  Past trials of medications like Adderall, Klonopin, Prozac, BuSpar, propranolol, trazodone, venlafaxine, Seroquel.  Past Medical History:  Past Medical History:  Diagnosis Date   Anxiety    Chronic kidney disease    kidney stones   GERD (gastroesophageal reflux disease)     Past Surgical History:  Procedure Laterality Date   ABDOMINAL HYSTERECTOMY     BREAST BIOPSY Left 2005   benign   COLONOSCOPY     COLONOSCOPY WITH PROPOFOL N/A 04/02/2016   Procedure: COLONOSCOPY WITH PROPOFOL;  Surgeon: Christena Deem, MD;  Location: [facility] ENDOSCOPY;  Service: Endoscopy;  Laterality: N/A;   KIDNEY STONE SURGERY Right    LAPAROSCOPIC GASTRIC BYPASS      Family Psychiatric History: I have reviewed family psychiatric history from progress note on 11/13/2022.  Family History:  Family History  Problem Relation Age of Onset  Colon polyps Mother    COPD Mother    Congestive Heart Failure Mother    Diabetes Mother    Dementia Father    Stroke Father     Social History: I have reviewed social history from progress note on 11/13/2022. Social History   Socioeconomic History   Marital status: Married    Spouse name: Not  on file   Number of children: 1   Years of education: Not on file   Highest education level: Some college, no degree  Occupational History   Not on file  Tobacco Use   Smoking status: Every Day    Current packs/day: 1.00    Types: Cigarettes   Smokeless tobacco: Never  Vaping Use   Vaping status: Former  Substance and Sexual Activity   Alcohol use: No   Drug use: No   Sexual activity: Not Currently  Other Topics Concern   Not on file  Social History Narrative   Not on file   Social Drivers of Health   Financial Resource Strain: High Risk (02/20/2023)   Overall Financial Resource Strain (CARDIA)    Difficulty of Paying Living Expenses: Hard  Food Insecurity: No Food Insecurity (02/20/2023)   Hunger Vital Sign    Worried About Running Out of Food in the Last Year: Never true    Ran Out of Food in the Last Year: Never true  Transportation Needs: Unmet Transportation Needs (02/20/2023)   PRAPARE - Transportation    Lack of Transportation (Medical): Yes    Lack of Transportation (Non-Medical): Yes  Physical Activity: Insufficiently Active (02/20/2023)   Exercise Vital Sign    Days of Exercise per Week: 1 day    Minutes of Exercise per Session: 10 min  Stress: Stress Concern Present (02/20/2023)   [employer] of Occupational Health - Occupational Stress Questionnaire    Feeling of Stress : Very much  Social Connections: Socially Isolated (02/20/2023)   Social Connection and Isolation Panel [NHANES]    Frequency of Communication with Friends and Family: Twice a week    Frequency of Social Gatherings with Friends and Family: Never    Attends Religious Services: Never    Database administrator or Organizations: No    Attends Banker Meetings: Never    Marital Status: Married    Allergies:  Allergies  Allergen Reactions   Nsaids Other (See Comments)    Patient has had gastric bypass.     Other     Had Gastric Bypass   Sulfamethoxazole Other (See  Comments)   Sulfamethoxazole-Trimethoprim Other (See Comments)   Trimethoprim Other (See Comments)    Metabolic Disorder Labs: Lab Results  Component Value Date   HGBA1C 5.6 05/28/2019   No results found for: "PROLACTIN" Lab Results  Component Value Date   CHOL 217 (H) 05/28/2019   TRIG 236 (H) 05/28/2019   HDL 36 (L) 05/28/2019   LDLCALC 138 (H) 05/28/2019   Lab Results  Component Value Date   TSH 2.340 01/21/2019    Therapeutic Level Labs: No results found for: "LITHIUM" No results found for: "VALPROATE" No results found for: "CBMZ"  Current Medications: Current Outpatient Medications  Medication Sig Dispense Refill   cephALEXin (KEFLEX) 500 MG capsule Take 500 mg by mouth 2 (two) times daily.     ARIPiprazole (ABILIFY) 5 MG tablet TAKE 1 TABLET (5 MG TOTAL) BY MOUTH IN THE MORNING 90 tablet 1   aspirin EC 81 MG tablet Take 1 tablet by mouth daily.  busPIRone (BUSPAR) 30 MG tablet Take 1 tablet (30 mg total) by mouth 2 (two) times daily.     clonazePAM (KLONOPIN) 0.5 MG tablet TAKE 0.5-1 TABLETS (0.25-0.5 MG TOTAL) BY MOUTH AS DIRECTED. TAKE HALF TO ONE TABLET ONCE A DAY AS NEEDED FOR SEVERE PANIC ATTACKS ONLY, PLEASE GIVE ATLEAST 8 HOURS GAP WITH YOUR OXYCODONE MEDICATION, DO NOT COMBINE. 8 tablet 0   dicyclomine (BENTYL) 10 MG capsule TAKE ONE CAPSULE BY MOUTH 3 TIMES A DAY AS NEEDED FOR CRAMPING (Patient not taking: Reported on 01/07/2023)     diphenoxylate-atropine (LOMOTIL) 2.5-0.025 MG tablet Take 1 tablet by mouth 4 (four) times daily as needed for diarrhea or loose stools. 30 tablet 0   fluconazole (DIFLUCAN) 150 MG tablet Take 1 tablet (150 mg total) by mouth every three (3) days as needed. (Patient not taking: Reported on 01/07/2023) 3 tablet 0   FLUoxetine (PROZAC) 20 MG capsule Take 1 capsule (20 mg total) by mouth daily. Take along with 40 mg daily ( has supplies of 40 mg ) 90 capsule 1   FLUoxetine (PROZAC) 40 MG capsule Take 2 capsules by mouth daily.      gabapentin (NEURONTIN) 600 MG tablet 600 mg. Take 1 tab 3-4 times daily.     HYDROcodone-acetaminophen (NORCO) 10-325 MG tablet take 1 tablet by oral route  every 8 hours as needed for pain (DNF 04/21/23)     levothyroxine (SYNTHROID) 50 MCG tablet Take 50 mcg by mouth daily.     omeprazole (PRILOSEC) 20 MG capsule TAKE 1 CAPSULE BY MOUTH EVERY DAY 90 capsule 1   ondansetron (ZOFRAN) 8 MG tablet Take 1 tablet (8 mg total) by mouth every 8 (eight) hours as needed for nausea or vomiting. (Patient not taking: Reported on 01/07/2023) 30 tablet 2   phenazopyridine (PYRIDIUM) 200 MG tablet Take by mouth.     promethazine (PHENERGAN) 12.5 MG tablet Take by mouth.     propranolol (INDERAL) 10 MG tablet Take 1 tablet (10 mg total) by mouth 2 (two) times daily as needed. For anxiety 180 tablet 1   rosuvastatin (CRESTOR) 20 MG tablet TAKE 1 TABLET BY MOUTH EVERY DAY 90 tablet 1   traZODone (DESYREL) 50 MG tablet Take 50 mg by mouth at bedtime as needed.     No current facility-administered medications for this visit.     Musculoskeletal: Strength & Muscle Tone:  UTA Gait & Station:  Seated Patient leans:  NA  Psychiatric Specialty Exam: Review of Systems  Psychiatric/Behavioral:  Positive for dysphoric mood. The patient is nervous/anxious.     There were no vitals taken for this visit.There is no height or weight on file to calculate BMI.  General Appearance: Casual  Eye Contact:  Fair  Speech:  Clear and Coherent  Volume:  Normal  Mood:  Anxious and Depressed  Affect:  Congruent  Thought Process:  Goal Directed and Descriptions of Associations: Intact  Orientation:  Full (Time, Place, and Person)  Thought Content: Logical   Suicidal Thoughts:  No  Homicidal Thoughts:  No  Memory:  Immediate;   Fair Recent;   Fair Remote;   Fair  Judgement:  Fair  Insight:  Fair  Psychomotor Activity:  Normal  Concentration:  Concentration: Fair and Attention Span: Fair  Recall:  [employer] of  Knowledge: Fair  Language: Fair  Akathisia:  No  Handed:  Right  AIMS (if indicated): not done  Assets:  Desire for Improvement Housing Social Support Vocational/Educational  ADL's:  Intact  Cognition: WNL  Sleep:  Fair   Screenings: AIMS    Flowsheet Row Office Visit from 12/03/2022 in [facility]  AIMS Total Score 0      GAD-7    Flowsheet Row Counselor from 02/20/2023 in [facility] Office Visit from 11/13/2022 in [facility]  Total GAD-7 Score 7 8      PHQ2-9    Flowsheet Row Counselor from 02/20/2023 in [facility] Office Visit from 12/03/2022 in [facility] Office Visit from 11/13/2022 in [facility] Office Visit from 03/02/2020 in [facility] Office Visit from 12/23/2019 in [facility]  PHQ-2 Total Score 6 4 5 6  0  PHQ-9 Total Score 18 11 17 24  --      Flowsheet Row Video Visit from 04/17/2023 in [facility] Video Visit from 03/06/2023 in [facility] Counselor from 02/20/2023 in [facility]  C-SSRS RISK CATEGORY No Risk No Risk No Risk        Assessment and Plan: Tamara Garrett is a 58 year old Caucasian female with multiple medical problems including MDD, panic attacks, sensorineural deafness, UTI, cognitive issues, neuropathy was evaluated by telemedicine today.  Discussed assessment and plan as noted below.  Major Depressive Disorder-unstable No significant change in depressive symptoms despite Prozac 60 mg. Persistent low mood and lack of motivation, potentially exacerbated by recurrent UTIs and associated physical symptoms. Feels stuck at home, contributing to  depressive symptoms. Further increase in Prozac may not be beneficial as symptoms may be influenced by physical health issues rather than medication efficacy. - Continue Prozac 60 mg - Continue Abilify 5 mg daily - Continue BuSpar 30 mg twice daily - Continue Trazodone 50 mg at bedtime as needed - Continue therapy with Tamara Garrett  Panic disorder-improving Improvement in panic attacks with Prozac, noting reduced frequency and intensity. Buspar 30 mg twice daily may contribute to anxiety management. Positive impact of Prozac on anxiety symptoms acknowledged. - Continue Prozac 60 mg - Continue Buspar 30 mg twice daily - Continue Clonazepam 0.5 mg as needed for severe panic attacks only. - Continue Propranolol 10 mg twice daily as needed for severe anxiety attacks.  Coordinated care with Ms.Gainey , Therapist. Discussed starting ACT therapy.  Follow-up Follow-up in clinic in 3 months or sooner if needed.    Collaboration of Care: Collaboration of Care: Primary Care Provider AEB encouraged to continue follow up with primary care provider for management of her current physical complaints including dizziness, UTIs and Referral or follow-up with counselor/therapist AEB continue psychotherapy sessions.  Patient/Guardian was advised Release of Information must be obtained prior to any record release in order to collaborate their care with an outside provider. Patient/Guardian was advised if they have not already done so to contact the registration department to sign all necessary forms in order for [place] to release information regarding their care.   Consent: Patient/Guardian gives verbal consent for treatment and assignment of benefits for services provided during this visit. Patient/Guardian expressed understanding and agreed to proceed.  This note was generated in part or whole with voice recognition software. Voice recognition is usually quite accurate but there are transcription errors that  can and very often do occur. I apologize for any typographical errors that were not detected and corrected.   Discussed the  use of a AI scribe software for clinical note transcription with the patient, who gave verbal consent to proceed.    Jomarie Longs, MD 04/18/2023, 12:16 PM

## 2023-04-18 ENCOUNTER — Ambulatory Visit: Payer: PPO | Admitting: Professional Counselor

## 2023-04-18 DIAGNOSIS — F4323 Adjustment disorder with mixed anxiety and depressed mood: Secondary | ICD-10-CM | POA: Diagnosis not present

## 2023-04-18 NOTE — Progress Notes (Signed)
 THERAPIST PROGRESS NOTE  Virtual Visit via Video Note  I connected with Tamara Garrett on 04/18/23 at 11:00 AM EDT by a video enabled telemedicine application and verified that I am speaking with the correct person using two identifiers.  Location: Patient: Home Provider: Home office   I discussed the limitations of evaluation and management by telemedicine and the availability of in person appointments. The patient expressed understanding and agreed to proceed.   I discussed the assessment and treatment plan with the patient. The patient was provided an opportunity to ask questions and all were answered. The patient agreed with the plan and demonstrated an understanding of the instructions.   The patient was advised to call back or seek an in-person evaluation if the symptoms worsen or if the condition fails to improve as anticipated.  I provided 47 minutes of non-face-to-face time during this encounter. Tamara Garrett, [facility]  Session Time: 11:01 AM - 11:48 AM  Participation Level: Active  Behavioral Response: Casual, Alert, Anxious and Dysphoric  Type of Therapy: Individual Therapy  Treatment Goals addressed: Active OP Depression  LTG: "I'm just stuck in this setting, in this chair, and in this depression, even with medication I haven't been able to pull myself out of it. I give up so easy now. I used to be the main person in our family that stood up and took over everything."                Start:  03/03/23    Expected End:  03/01/24      STG: "I really need to focus on getting my house back in order. I just let it go and I do the bare minimum." To manage ADLs AEB utilizing coping strategies to complete tasks on a daily basis over the next 90 days.     STG: "I don't let things out. I just say everything's okay, everything's fine." To acknowledge and process emotions AEB engaging in talk therapy and sharing thoughts/feelings within personal relationships over the next 12  weeks.    ProgressTowards Goals: Progressing  Interventions: CBT and Solution Focused  Summary: Tamara Garrett is a 58 y.o. female who presents with  a history of anxiety and depression.  She appeared alert and oriented x4. She stated she celebrated her 58th birthday and her family threw her a surprise party. She really enjoyed that. [name] has been dealing with a recurrent UTI and states she feels "yucky but had a good week." She engaged discussion about acceptance and commitment to making changes. [name] engaged in completing a Recruitment consultant, identifying goals of checking the mail, de-cluttering the house, and weekly socialization activities. She identified obstacles and explored strategies to overcome those obstacles. She took notes on tips offered by this Clinical research associate. She stated she feels good about the session and understands she has the tools to start making changes.   Therapist Response: Conducted telehealth session with this Clinical research associate. Began session with check-in/update since previous session. Utilized empathetic and reflective listening. Engaged in discussion about acceptance and explained WISE MIND to balance emotion/logic. Used a Recruitment consultant to help [name] identify goals, obstacles, and strategies to overcome obstacles for behavior activation. Scheduled additional appointment and concluded session.   Suicidal/Homicidal: No  Plan: Return again in 3 weeks.  Diagnosis: Adjustment disorder with mixed anxiety and depressed mood  Collaboration of Care: Medication Management AEB chart review  Patient/Guardian was advised Release of Information must be obtained prior to any record release in order to  collaborate their care with an outside provider. Patient/Guardian was advised if they have not already done so to contact the registration department to sign all necessary forms in order for [place] to release information regarding their care.   Consent: Patient/Guardian gives verbal consent  for treatment and assignment of benefits for services provided during this visit. Patient/Guardian expressed understanding and agreed to proceed.   Tamara Garrett, [facility] 04/18/2023

## 2023-05-09 ENCOUNTER — Ambulatory Visit: Admitting: Professional Counselor

## 2023-05-09 DIAGNOSIS — F4323 Adjustment disorder with mixed anxiety and depressed mood: Secondary | ICD-10-CM | POA: Diagnosis not present

## 2023-05-09 NOTE — Progress Notes (Signed)
 THERAPIST PROGRESS NOTE  Virtual Visit via Video Note  I connected with Tamara Garrett on 05/09/23 at 11:00 AM EDT by a video enabled telemedicine application and verified that I am speaking with the correct person using two identifiers.  Location: Patient: Home Provider: Home Office   I discussed the limitations of evaluation and management by telemedicine and the availability of in person appointments. The patient expressed understanding and agreed to proceed.   I discussed the assessment and treatment plan with the patient. The patient was provided an opportunity to ask questions and all were answered. The patient agreed with the plan and demonstrated an understanding of the instructions.   The patient was advised to call back or seek an in-person evaluation if the symptoms worsen or if the condition fails to improve as anticipated.  I provided 47 minutes of non-face-to-face time during this encounter. Len Quale, [facility]  Session Time: 11:01 AM - 11:48 AM   Participation Level: Active  Behavioral Response: Casual, Alert, Dysphoric  Type of Therapy: Individual Therapy  Treatment Goals addressed: Active OP Depression  LTG: "I'm just stuck in this setting, in this chair, and in this depression, even with medication I haven't been able to pull myself out of it. I give up so easy now. I used to be the main person in our family that stood up and took over everything."                Start:  03/03/23    Expected End:  03/01/24      STG: "I really need to focus on getting my house back in order. I just let it go and I do the bare minimum." To manage ADLs AEB utilizing coping strategies to complete tasks on a daily basis over the next 90 days.     STG: "I don't let things out. I just say everything's okay, everything's fine." To acknowledge and process emotions AEB engaging in talk therapy and sharing thoughts/feelings within personal relationships over the next 12 weeks.     ProgressTowards Goals: Progressing  Interventions: CBT, Solution Focused, Assertiveness Training, and Supportive  Summary: Tamara Garrett is a 58 y.o. female who presents with a history of anxiety and depression.  She appeared alert and oriented x5. She stated she has made some progress on the action plan created last session. Tamara Garrett was receptive to acknowledging more progress than she originally did. She engaged in discussion and was able to identify unhelpful words/thoughts (I have completely lost my independence.) She was receptive to checking the facts and noted the word "completely" isn't actually true. Tamara Garrett agreed with her continued trouble with acceptance but was receptive to continuing to try and practice acceptance and commitment to making changes, such as smoking cessation. She noted she could reduce to 8-9 daily instead of 10. She discussed upcoming plans for the weekend and processed thoughts around her son's religion. She was receptive to ways to show curiosity and interest instead of sounding judgmental.  Therapist Response: Conducted session with Tamara Garrett. Began session with check-in/update since previous session. Utilized empathetic and reflective listening. Praised Tamara Garrett on progress and assisted with identifying additional progress. Explored ways to overcome additional obstacles to action plan. Noted thinking patterns (overgeneralization) that may contribute to negative emotions. Assisted with checking the facts for emotion regulation. Processed Tamara Garrett's thoughts/feelings around son's religion. Modeled ways to be assertive and inquire about this. Scheduled additional appointment and concluded session.   Suicidal/Homicidal: No  Plan: Return again in  4 weeks.  Diagnosis: Adjustment disorder with mixed anxiety and depressed mood  Collaboration of Care: Medication Management AEB chart review  Patient/Guardian was advised Release of Information must be obtained prior to any record  release in order to collaborate their care with an outside provider. Patient/Guardian was advised if they have not already done so to contact the registration department to sign all necessary forms in order for us  to release information regarding their care.   Consent: Patient/Guardian gives verbal consent for treatment and assignment of benefits for services provided during this visit. Patient/Guardian expressed understanding and agreed to proceed.   Len Quale, [facility] 05/09/2023

## 2023-06-02 DIAGNOSIS — F410 Panic disorder [episodic paroxysmal anxiety] without agoraphobia: Secondary | ICD-10-CM

## 2023-06-03 MED ORDER — CLONAZEPAM 0.5 MG PO TABS: 0.2500 mg | ORAL_TABLET | ORAL | 0 refills | Status: DC

## 2023-06-06 ENCOUNTER — Ambulatory Visit: Admitting: Professional Counselor

## 2023-06-06 DIAGNOSIS — F4323 Adjustment disorder with mixed anxiety and depressed mood: Secondary | ICD-10-CM

## 2023-06-06 NOTE — Progress Notes (Signed)
 THERAPIST PROGRESS NOTE  Virtual Visit via Video Note  I connected with Tamara Garrett on 06/06/23 at  9:00 AM EDT by a video enabled telemedicine application and verified that I am speaking with the correct person using two identifiers.  Location: Patient: Home Provider: Remote office   I discussed the limitations of evaluation and management by telemedicine and the availability of in person appointments. The patient expressed understanding and agreed to proceed.   I discussed the assessment and treatment plan with the patient. The patient was provided an opportunity to ask questions and all were answered. The patient agreed with the plan and demonstrated an understanding of the instructions.   The patient was advised to call back or seek an in-person evaluation if the symptoms worsen or if the condition fails to improve as anticipated.  I provided 18 minutes of non-face-to-face time during this encounter. Len Quale, [facility]  Session Time: 9:00 AM - 9:18 AM  Participation Level: Active  Behavioral Response: Casual, Alert, Euthymic  Type of Therapy: Individual Therapy  Treatment Goals addressed:  Active OP Depression  LTG: "I'm just stuck in this setting, in this chair, and in this depression, even with medication I haven't been able to pull myself out of it. I give up so easy now. I used to be the main person in our family that stood up and took over everything."                Start:  03/03/23    Expected End:  03/01/24      STG: "I really need to focus on getting my house back in order. I just let it go and I do the bare minimum." To manage ADLs AEB utilizing coping strategies to complete tasks on a daily basis over the next 90 days.     STG: "I don't let things out. I just say everything's okay, everything's fine." To acknowledge and process emotions AEB engaging in talk therapy and sharing thoughts/feelings within personal relationships over the next 12 weeks.     ProgressTowards Goals: Progressing  Interventions: Motivational Interviewing and Supportive  Summary: Tamara Garrett is a 58 y.o. female who presents with a history of anxiety and depression. She appeared alert and oriented x5. She reported she has a headache this morning and would like to have a short session. Tamara Garrett has continued to spend Friday evenings with her sisters and she's been enjoying that. She is unsure about this weekend, as she might go with her husband and his siblings to the beach. If she stays home, she plans to also clean out her closet/clothes. Tamara Garrett has been able to fairly consistently go to the mailbox which was one of her goals. She is still struggling with smoking but continues to want to smoke and considers recommitting to this goal. Overall, Tamara Garrett is doing well and reported it was helpful to notice the ways she is making progress rather than focusing on just the negatives, like she had been.   Therapist Response: Conducted session with Tamara Garrett. Began session with check-in/update since previous session. Utilized empathetic and reflective listening. Used open-ended questions to facilitate discussion and summarized Tamara Garrett's thoughts/feelings. Praised Tamara Garrett on progress she's made. Reminded her of urge surfing to help with smoking cessation. Scheduled additional appointment and concluded session.   Suicidal/Homicidal: No  Plan: Return again in 4 weeks.  Diagnosis: Adjustment disorder with mixed anxiety and depressed mood  Collaboration of Care: Medication Management AEB chart review  Patient/Guardian was advised  Release of Information must be obtained prior to any record release in order to collaborate their care with an outside provider. Patient/Guardian was advised if they have not already done so to contact the registration department to sign all necessary forms in order for us  to release information regarding their care.   Consent: Patient/Guardian gives verbal consent  for treatment and assignment of benefits for services provided during this visit. Patient/Guardian expressed understanding and agreed to proceed.   Len Quale, [facility] 06/06/2023

## 2023-07-04 ENCOUNTER — Ambulatory Visit (INDEPENDENT_AMBULATORY_CARE_PROVIDER_SITE_OTHER): Admitting: Professional Counselor

## 2023-07-04 DIAGNOSIS — F4323 Adjustment disorder with mixed anxiety and depressed mood: Secondary | ICD-10-CM | POA: Diagnosis not present

## 2023-07-04 NOTE — Progress Notes (Signed)
 THERAPIST PROGRESS NOTE  Virtual Visit via Video Note  I connected with Tamara Garrett on 07/04/23 at  9:00 AM EDT by a video enabled telemedicine application and verified that I am speaking with the correct person using two identifiers.  Location: Patient: Home Provider: Remote office   I discussed the limitations of evaluation and management by telemedicine and the availability of in person appointments. The patient expressed understanding and agreed to proceed.   I discussed the assessment and treatment plan with the patient. The patient was provided an opportunity to ask questions and all were answered. The patient agreed with the plan and demonstrated an understanding of the instructions.   The patient was advised to call back or seek an in-person evaluation if the symptoms worsen or if the condition fails to improve as anticipated.  I provided 19 minutes of non-face-to-face time during this encounter. Len Quale, [facility]  Session Time: 9:00 AM - 9:19 AM  Participation Level: Active  Behavioral Response: Casual, Alert, Anxious  Type of Therapy: Individual Therapy  Treatment Goals addressed: Active OP Depression  LTG: I'm just stuck in this setting, in this chair, and in this depression, even with medication I haven't been able to pull myself out of it. I give up so easy now. I used to be the main person in our family that stood up and took over everything.                Start:  03/03/23    Expected End:  03/01/24      STG: I really need to focus on getting my house back in order. I just let it go and I do the bare minimum. To manage ADLs AEB utilizing coping strategies to complete tasks on a daily basis over the next 90 days.     STG: I don't let things out. I just say everything's okay, everything's fine. To acknowledge and process emotions AEB engaging in talk therapy and sharing thoughts/feelings within personal relationships over the next 12 weeks.     ProgressTowards Goals: Progressing  Interventions: CBT, Motivational Interviewing, and Supportive  Summary: Tamara Garrett is a 58 y.o. female who presents with a history of anxiety and depression. She appeared somber but oriented x5. She stated she has been able to get out of the house for various things like attending the grand kids sports events and sister visits. Tamara Garrett has plans to go to the beach with her husband soon. She noted they also have a family reunion with his family tomorrow. She expressed some anxiety around this and previous events. She was able to identify thinking that contributes to her anxiety and appeared receptive to restructuring those thoughts. She was also receptive to WISE MIND to balance emotion and logic. Tamara Garrett has not been able to make any progress with smoking cessation.   Therapist Response: Conducted session with Tamara Garrett. Began session with check-in/update since previous session. Utilized empathetic and reflective listening. Used open-ended questions to facilitate discussion and summarized Tamara Garrett's thoughts/feelings. Explored thinking patterns around anxiety and assisted with restructuring thinking to reduce anxiety/panic. Reminded Tamara Garrett of TIP skill for distress tolerance. Provided psychoeducation on nervous system. Explained WISE MIND to balance emotion and logic. Scheduled additional appointment and concluded session.   Suicidal/Homicidal: No  Plan: Return again in 4 weeks.  Diagnosis: Adjustment disorder with mixed anxiety and depressed mood  Collaboration of Care: Medication Management AEB chart review  Patient/Guardian was advised Release of Information must be obtained prior to  any record release in order to collaborate their care with an outside provider. Patient/Guardian was advised if they have not already done so to contact the registration department to sign all necessary forms in order for us  to release information regarding their care.   Consent:  Patient/Guardian gives verbal consent for treatment and assignment of benefits for services provided during this visit. Patient/Guardian expressed understanding and agreed to proceed.   Len Quale, [facility] 07/04/2023

## 2023-07-11 ENCOUNTER — Other Ambulatory Visit: Payer: Self-pay | Admitting: Psychiatry

## 2023-07-11 DIAGNOSIS — F333 Major depressive disorder, recurrent, severe with psychotic symptoms: Secondary | ICD-10-CM

## 2023-07-14 ENCOUNTER — Encounter: Payer: Self-pay | Admitting: Psychiatry

## 2023-07-14 ENCOUNTER — Telehealth (INDEPENDENT_AMBULATORY_CARE_PROVIDER_SITE_OTHER): Admitting: Psychiatry

## 2023-07-14 DIAGNOSIS — F3342 Major depressive disorder, recurrent, in full remission: Secondary | ICD-10-CM

## 2023-07-14 DIAGNOSIS — F410 Panic disorder [episodic paroxysmal anxiety] without agoraphobia: Secondary | ICD-10-CM

## 2023-07-14 DIAGNOSIS — F333 Major depressive disorder, recurrent, severe with psychotic symptoms: Secondary | ICD-10-CM

## 2023-07-14 NOTE — Progress Notes (Unsigned)
 Virtual Visit via Video Note  I connected with Tamara Garrett on 07/14/23 at  1:00 PM EDT by a video enabled telemedicine application and verified that I am speaking with the correct person using two identifiers.  Location Provider Location : [facility] Patient Location : Home  Participants: Patient , Provider    I discussed the limitations of evaluation and management by telemedicine and the availability of in person appointments. The patient expressed understanding and agreed to proceed.   I discussed the assessment and treatment plan with the patient. The patient was provided an opportunity to ask questions and all were answered. The patient agreed with the plan and demonstrated an understanding of the instructions.   The patient was advised to call back or seek an in-person evaluation if the symptoms worsen or if the condition fails to improve as anticipated.   BH MD OP Progress Note  07/14/2023 1:19 PM Tamara Garrett  MRN:  [id number]  Chief Complaint:  Chief Complaint  Patient presents with   Follow-up   Anxiety   Depression   Medication Refill   Discussed the use of AI scribe software for clinical note transcription with the patient, who gave verbal consent to proceed.  History of Present Illness Tamara Garrett is a 58 year old Caucasian female on disability, married, lives in [place], has a history of MDD, panic disorder, gastric bypass, sensorineural deafness, cerebrovascular disease, recurrent UTIs, chronic nausea, cognitive issue, back pain was evaluated by telemedicine today.  She experiences anxiety and panic attacks, particularly during family gatherings. Increased family interactions this month have heightened her anxiety. She has used Klonopin  three to four times in the past month, which helps alleviate her symptoms.  However overall she believes her anxiety symptoms have improved compared to previously.  She also believes depression symptoms have improved.  She  reports sleep and appetite is fair.  She is engaged in therapy focusing on walking as exercise, breathing exercises for grounding during anxiety episodes.  She experienced dizziness and fell at the beach during a recent trip, resulting in bruises but no fractures. Dizziness is a common occurrence for her, though she was unable to catch herself during this incident.  Her current medications include clonazepam  as needed, abilify , fluoxetine  60 mg, and trazodone 50 mg as needed for sleep. No side effects from her medications are reported.  Denies thoughts of self-harm or harm to others.    Visit Diagnosis:    ICD-10-CM   1. Recurrent major depressive disorder, in full remission (HCC)  F33.42     2. Panic disorder  F41.0       Past Psychiatric History: I have reviewed past psychiatric history from progress note on 11/13/2022.  Past trials of medications like Adderall, Klonopin , Prozac , BuSpar , propranolol , trazodone, venlafaxine, Seroquel.  Past Medical History:  Past Medical History:  Diagnosis Date   Anxiety    Chronic kidney disease    kidney stones   GERD (gastroesophageal reflux disease)     Past Surgical History:  Procedure Laterality Date   ABDOMINAL HYSTERECTOMY     BREAST BIOPSY Left 2005   benign   COLONOSCOPY     COLONOSCOPY WITH PROPOFOL  N/A 04/02/2016   Procedure: COLONOSCOPY WITH PROPOFOL ;  Surgeon: Gladis RAYMOND Mariner, MD;  Location: [facility] ENDOSCOPY;  Service: Endoscopy;  Laterality: N/A;   KIDNEY STONE SURGERY Right    LAPAROSCOPIC GASTRIC BYPASS      Family Psychiatric History: I have reviewed family psychiatric history from progress note on  11/13/2022.  Family History:  Family History  Problem Relation Age of Onset   Colon polyps Mother    COPD Mother    Congestive Heart Failure Mother    Diabetes Mother    Dementia Father    Stroke Father     Social History: I have reviewed social history from progress note on 11/13/2022. Social History    Socioeconomic History   Marital status: Married    Spouse name: Not on file   Number of children: 1   Years of education: Not on file   Highest education level: Some college, no degree  Occupational History   Not on file  Tobacco Use   Smoking status: Every Day    Current packs/day: 1.00    Types: Cigarettes   Smokeless tobacco: Never  Vaping Use   Vaping status: Former  Substance and Sexual Activity   Alcohol use: No   Drug use: No   Sexual activity: Not Currently  Other Topics Concern   Not on file  Social History Narrative   Not on file   Social Drivers of Health   Financial Resource Strain: High Risk (02/20/2023)   Overall Financial Resource Strain (CARDIA)    Difficulty of Paying Living Expenses: Hard  Food Insecurity: No Food Insecurity (02/20/2023)   Hunger Vital Sign    Worried About Running Out of Food in the Last Year: Never true    Ran Out of Food in the Last Year: Never true  Transportation Needs: Unmet Transportation Needs (02/20/2023)   PRAPARE - Transportation    Lack of Transportation (Medical): Yes    Lack of Transportation (Non-Medical): Yes  Physical Activity: Insufficiently Active (02/20/2023)   Exercise Vital Sign    Days of Exercise per Week: 1 day    Minutes of Exercise per Session: 10 min  Stress: Stress Concern Present (02/20/2023)   [employer] of Occupational Health - Occupational Stress Questionnaire    Feeling of Stress : Very much  Social Connections: Socially Isolated (02/20/2023)   Social Connection and Isolation Panel    Frequency of Communication with Friends and Family: Twice a week    Frequency of Social Gatherings with Friends and Family: Never    Attends Religious Services: Never    Database administrator or Organizations: No    Attends Banker Meetings: Never    Marital Status: Married    Allergies:  Allergies  Allergen Reactions   Nsaids Other (See Comments)    Patient has had gastric bypass.      Other     Had Gastric Bypass   Sulfamethoxazole Other (See Comments)   Sulfamethoxazole-Trimethoprim Other (See Comments)   Trimethoprim Other (See Comments)    Metabolic Disorder Labs: Lab Results  Component Value Date   HGBA1C 5.6 05/28/2019   No results found for: PROLACTIN Lab Results  Component Value Date   CHOL 217 (H) 05/28/2019   TRIG 236 (H) 05/28/2019   HDL 36 (L) 05/28/2019   LDLCALC 138 (H) 05/28/2019   Lab Results  Component Value Date   TSH 2.340 01/21/2019    Therapeutic Level Labs: No results found for: LITHIUM No results found for: VALPROATE No results found for: CBMZ  Current Medications: Current Outpatient Medications  Medication Sig Dispense Refill   ARIPiprazole  (ABILIFY ) 5 MG tablet TAKE 1 TABLET (5 MG TOTAL) BY MOUTH IN THE MORNING 90 tablet 1   aspirin EC 81 MG tablet Take 1 tablet by mouth daily.  busPIRone  (BUSPAR ) 30 MG tablet Take 1 tablet (30 mg total) by mouth 2 (two) times daily.     clonazePAM  (KLONOPIN ) 0.5 MG tablet Take 0.5-1 tablets (0.25-0.5 mg total) by mouth as directed. Take half to one tablet once a day as needed for severe panic attacks only, please give atleast 8 hours gap with your oxycodone medication, do not combine. 8 tablet 0   diphenoxylate -atropine  (LOMOTIL ) 2.5-0.025 MG tablet Take 1 tablet by mouth 4 (four) times daily as needed for diarrhea or loose stools. 30 tablet 0   FLUoxetine  (PROZAC ) 20 MG capsule Take 1 capsule (20 mg total) by mouth daily. Take along with 40 mg daily ( has supplies of 40 mg ) 90 capsule 1   FLUoxetine  (PROZAC ) 40 MG capsule Take 2 capsules by mouth daily.     gabapentin (NEURONTIN) 600 MG tablet 600 mg. Take 1 tab 3-4 times daily.     HYDROcodone-acetaminophen (NORCO) 10-325 MG tablet take 1 tablet by oral route  every 8 hours as needed for pain (DNF 04/21/23)     levothyroxine (SYNTHROID) 50 MCG tablet Take 50 mcg by mouth daily.     omeprazole  (PRILOSEC) 20 MG capsule TAKE 1 CAPSULE  BY MOUTH EVERY DAY 90 capsule 1   phenazopyridine  (PYRIDIUM ) 200 MG tablet Take by mouth.     promethazine  (PHENERGAN ) 12.5 MG tablet Take by mouth.     propranolol  (INDERAL ) 10 MG tablet Take 1 tablet (10 mg total) by mouth 2 (two) times daily as needed. For anxiety 180 tablet 1   rosuvastatin  (CRESTOR ) 20 MG tablet TAKE 1 TABLET BY MOUTH EVERY DAY 90 tablet 1   traZODone (DESYREL) 50 MG tablet Take 50 mg by mouth at bedtime as needed.     No current facility-administered medications for this visit.     Musculoskeletal: Strength & Muscle Tone: UTA Gait & Station: Seated Patient leans: N/A  Psychiatric Specialty Exam: Review of Systems  Psychiatric/Behavioral: Negative.      There were no vitals taken for this visit.There is no height or weight on file to calculate BMI.  General Appearance: Fairly Groomed  Eye Contact:  Fair  Speech:  Clear and Coherent  Volume:  Normal  Mood:  Euthymic  Affect:  Congruent  Thought Process:  Goal Directed and Descriptions of Associations: Intact  Orientation:  Full (Time, Place, and Person)  Thought Content: Logical   Suicidal Thoughts:  No  Homicidal Thoughts:  No  Memory:  Immediate;   Fair Recent;   Poor Remote;   Fair  Judgement:  Fair  Insight:  Fair  Psychomotor Activity:  Normal  Concentration:  Concentration: Fair and Attention Span: Fair  Recall:  [employer] of Knowledge: Fair  Language: Fair  Akathisia:  No  Handed:  Right  AIMS (if indicated): not done  Assets:  Manufacturing systems engineer Desire for Improvement Housing Social Support Transportation  ADL's:  Intact  Cognition: WNL  Sleep:  Fair   Screenings: Geneticist, molecular Office Visit from 12/03/2022 in [facility]  AIMS Total Score 0   GAD-7    Flowsheet Row Counselor from 02/20/2023 in [facility] Office Visit from 11/13/2022 in [facility]  Total GAD-7 Score 7 8   PHQ2-9    Flowsheet Row Video Visit from 07/14/2023 in [facility] Counselor from 02/20/2023 in [facility] Office Visit from  12/03/2022 in [facility] Office Visit from 11/13/2022 in [facility] Office Visit from 03/02/2020 in [facility]  PHQ-2 Total Score 0 6 4 5 6   PHQ-9 Total Score -- 18 11 17 24    Flowsheet Row Video Visit from 07/14/2023 in [facility] Video Visit from 04/17/2023 in [facility] Video Visit from 03/06/2023 in [facility]  C-SSRS RISK CATEGORY No Risk No Risk No Risk     Assessment and Plan: Tamara Garrett is a 58 year old Caucasian female with multiple medical problems including MDD, panic attacks, sensorineural deafness was evaluated by telemedicine today.  Discussed assessment and plan as noted below.  MDD in remission Currently denies any significant depression symptoms.  Overall well managed on current medication regimen. Continue Prozac  60 mg daily Continue Abilify  5 mg daily (will consider weaning off in the future) Continue BuSpar  30 mg twice daily Continue Trazodone 50 mg at bedtime as needed Continue CBT with Ms. Almarie Ligas  Panic disorder-improving Currently reports panic symptoms overall has improved with less frequent attacks recently. Continue Prozac  60 mg daily Continue BuSpar  30 mg twice daily Continue Clonazepam  0.5 mg as needed for severe panic attacks only Continue Propranolol  10 mg twice a day as needed for severe anxiety attacks. Reviewed [place] PMP AWARxE  Follow-up Follow-up in clinic in 3 months or sooner in person.  Collaboration of Care: Collaboration of Care: Referral or follow-up with  counselor/therapist AEB I have reviewed notes per Ms. Ligas dated 07/04/2023-patient currently engaged in therapy.  Patient/Guardian was advised Release of Information must be obtained prior to any record release in order to collaborate their care with an outside provider. Patient/Guardian was advised if they have not already done so to contact the registration department to sign all necessary forms in order for us  to release information regarding their care.   Consent: Patient/Guardian gives verbal consent for treatment and assignment of benefits for services provided during this visit. Patient/Guardian expressed understanding and agreed to proceed.   This note was generated in part or whole with voice recognition software. Voice recognition is usually quite accurate but there are transcription errors that can and very often do occur. I apologize for any typographical errors that were not detected and corrected.    Ameila Weldon, MD 07/15/2023, 12:52 PM

## 2023-07-19 ENCOUNTER — Other Ambulatory Visit: Payer: Self-pay | Admitting: Psychiatry

## 2023-07-19 DIAGNOSIS — F410 Panic disorder [episodic paroxysmal anxiety] without agoraphobia: Secondary | ICD-10-CM

## 2023-07-28 ENCOUNTER — Ambulatory Visit: Admitting: Professional Counselor

## 2023-07-28 NOTE — Progress Notes (Deleted)
  THERAPIST PROGRESS NOTE  Virtual Visit via Video Note  I connected with Tamara Garrett on 07/28/23 at 11:00 AM EDT by a video enabled telemedicine application and verified that I am speaking with the correct person using two identifiers.  Location: Patient: *** Provider: ***   I discussed the limitations of evaluation and management by telemedicine and the availability of in person appointments. The patient expressed understanding and agreed to proceed.   I discussed the assessment and treatment plan with the patient. The patient was provided an opportunity to ask questions and all were answered. The patient agreed with the plan and demonstrated an understanding of the instructions.   The patient was advised to call back or seek an in-person evaluation if the symptoms worsen or if the condition fails to improve as anticipated.  I provided *** minutes of non-face-to-face time during this encounter.   Tamara Garrett, [facility]  Session Time: ***  Participation Level: {BHH PARTICIPATION LEVEL:22264}  Behavioral Response: {Appearance:22683}{BHH LEVEL OF CONSCIOUSNESS:22305}{BHH MOOD:22306}  Type of Therapy: {CHL AMB BH Type of Therapy:21022741}  Treatment Goals addressed: ***  ProgressTowards Goals: {Progress Towards Goals:21014066}  Interventions: {CHL AMB BH Type of Intervention:21022753}  Summary: Tamara Garrett is a 58 y.o. female who presents with ***.   Suicidal/Homicidal: {BHH YES OR NO:22294}{yes/no/with/without intent/plan:22693}  Therapist Response: ***  Plan: Return again in *** weeks.  Diagnosis: No diagnosis found.  Collaboration of Care: {BH OP Collaboration of Care:21014065}  Patient/Guardian was advised Release of Information must be obtained prior to any record release in order to collaborate their care with an outside provider. Patient/Guardian was advised if they have not already done so to contact the registration department to sign all necessary  forms in order for us  to release information regarding their care.   Consent: Patient/Guardian gives verbal consent for treatment and assignment of benefits for services provided during this visit. Patient/Guardian expressed understanding and agreed to proceed.   Tamara Garrett, [facility] 07/28/2023

## 2023-08-20 ENCOUNTER — Ambulatory Visit (INDEPENDENT_AMBULATORY_CARE_PROVIDER_SITE_OTHER): Admitting: Professional Counselor

## 2023-08-20 DIAGNOSIS — F4323 Adjustment disorder with mixed anxiety and depressed mood: Secondary | ICD-10-CM

## 2023-08-20 DIAGNOSIS — F3342 Major depressive disorder, recurrent, in full remission: Secondary | ICD-10-CM

## 2023-08-20 DIAGNOSIS — F410 Panic disorder [episodic paroxysmal anxiety] without agoraphobia: Secondary | ICD-10-CM | POA: Diagnosis not present
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
## 2023-08-30 ENCOUNTER — Other Ambulatory Visit: Payer: Self-pay | Admitting: Psychiatry

## 2023-08-30 DIAGNOSIS — F410 Panic disorder [episodic paroxysmal anxiety] without agoraphobia: Secondary | ICD-10-CM

## 2023-08-30 DIAGNOSIS — F333 Major depressive disorder, recurrent, severe with psychotic symptoms: Secondary | ICD-10-CM

## 2023-09-10 DIAGNOSIS — F410 Panic disorder [episodic paroxysmal anxiety] without agoraphobia: Secondary | ICD-10-CM

## 2023-09-10 MED ORDER — CLONAZEPAM 0.5 MG PO TABS: 0.2500 mg | ORAL_TABLET | ORAL | 0 refills | Status: DC

## 2023-09-29 ENCOUNTER — Ambulatory Visit (INDEPENDENT_AMBULATORY_CARE_PROVIDER_SITE_OTHER): Admitting: Professional Counselor

## 2023-09-29 DIAGNOSIS — F4323 Adjustment disorder with mixed anxiety and depressed mood: Secondary | ICD-10-CM

## 2023-09-29 DIAGNOSIS — F3342 Major depressive disorder, recurrent, in full remission: Secondary | ICD-10-CM | POA: Diagnosis not present

## 2023-09-29 NOTE — Progress Notes (Signed)
 THERAPIST PROGRESS NOTE  Virtual Visit via Video Note  I connected with Tamara Garrett on 09/29/23 at 10:00 AM EDT by a video enabled telemedicine application and verified that I am speaking with the correct person using two identifiers.  Location: Patient: Home Provider: Remote office   I discussed the limitations of evaluation and management by telemedicine and the availability of in person appointments. The patient expressed understanding and agreed to proceed.   I discussed the assessment and treatment plan with the patient. The patient was provided an opportunity to ask questions and all were answered. The patient agreed with the plan and demonstrated an understanding of the instructions.   The patient was advised to call back or seek an in-person evaluation if the symptoms worsen or if the condition fails to improve as anticipated.  I provided 40 minutes of non-face-to-face time during this encounter. Tamara Garrett, [facility]  Session Time: 10:01 AM - 10:41 AM   Participation Level: Active  Behavioral Response: Casual, Alert, Dysphoric  Type of Therapy: Individual Therapy  Treatment Goals addressed: Active OP Depression  LTG: I'm just stuck in this setting, in this chair, and in this depression, even with medication I haven't been able to pull myself out of it. I give up so easy now. I used to be the main person in our family that stood up and took over everything. (Completed/Met)    Start:  03/03/23    Expected End:  03/01/24    Resolved:  09/29/23 Goal Note Reviewed 09/29/23 - Yeah, I feel like I've done pretty good on that. Its taken me awhile but getting out. I don't do it everyday but I get out more than I have in the last several years. So I do feel good about that.   STG: I really need to focus on getting my house back in order. I just let it go and I do the bare minimum. To manage ADLs AEB utilizing coping strategies to complete tasks on a daily basis over the  next 90 days.  (Progressing)  Goal Note Reviewed 09/29/23 - We've done a little bit. We had to put a new roof on. That was a lot of fun. But as far as inside the house. I've done some work in here. Nothing like I should have. I just try to work on it some days and some days I don't.  STG: I don't let things out. I just say everything's okay, everything's fine. To acknowledge and process emotions AEB engaging in talk therapy and sharing thoughts/feelings within personal relationships over the next 12 weeks.  (Progressing)  Goal Note Reviewed 09/29/23 - I don't know. I still feel like I do it. I just always have. That's who I've always been so it's just hard. It's even big for me to have a therapist because everything is always alright. But I've asked for help more. I still feel like I keep things in.  ProgressTowards Goals: Progressing  Interventions: CBT, Motivational Interviewing, and Supportive  Summary: Tamara Garrett is a 58 y.o. female who presents with a history of anxiety and depression. She appeared alert and oriented x5. 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 [name] shared an upcoming struggle, with her oldest sister's plan to move in November. She noted progress on goals and areas for continued improvement, including sharing her feelings more with others. [name] can start with sharing her sadness  around her sister moving.   Therapist Response: Conducted session with Tamara Garrett. Began session with check-in/update since previous session. Utilized empathetic and reflective listening. Used open-ended questions to facilitate discussion and summarized [name]'s thoughts/feelings. Reviewed treatment plan with input from [name] on current strengths, needs, and progress towards goals. Praised [name] for progress on reducing depression symptoms and engaging in more activities. Explored contributing factors and beliefs around  emotions and struggle sharing emotions with others. Encouraged [name] to start with name it to tame it to help identify her emotions and to start sharing with more people, potentially with her husband first, working up to sharing with her sister. Scheduled additional appointment and concluded session.   Suicidal/Homicidal: No  Plan: Return again in 4 weeks.  Diagnosis: Adjustment disorder with mixed anxiety and depressed mood  Recurrent major depressive disorder, in full remission (HCC)  Collaboration of Care: Medication Management AEB chart review  Patient/Guardian was advised Release of Information must be obtained prior to any record release in order to collaborate their care with an outside provider. Patient/Guardian was advised if they have not already done so to contact the registration department to sign all necessary forms in order for us  to release information regarding their care.   Consent: Patient/Guardian gives verbal consent for treatment and assignment of benefits for services provided during this visit. Patient/Guardian expressed understanding and agreed to proceed.   Tamara Garrett, [name] 09/29/2023

## 2023-10-08 ENCOUNTER — Encounter: Payer: Self-pay | Admitting: Psychiatry

## 2023-10-08 ENCOUNTER — Telehealth (INDEPENDENT_AMBULATORY_CARE_PROVIDER_SITE_OTHER): Admitting: Psychiatry

## 2023-10-08 DIAGNOSIS — F410 Panic disorder [episodic paroxysmal anxiety] without agoraphobia: Secondary | ICD-10-CM

## 2023-10-08 DIAGNOSIS — F3342 Major depressive disorder, recurrent, in full remission: Secondary | ICD-10-CM

## 2023-10-08 NOTE — Progress Notes (Unsigned)
 Virtual Visit via Video Note  I connected with Tamara Garrett on 10/08/23 at  3:00 PM EDT by a video enabled telemedicine application and verified that I am speaking with the correct person using two identifiers.  Location Provider Location : [facility] Patient Location : Home  Participants: Patient , Provider    I discussed the limitations of evaluation and management by telemedicine and the availability of in person appointments. The patient expressed understanding and agreed to proceed.   I discussed the assessment and treatment plan with the patient. The patient was provided an opportunity to ask questions and all were answered. The patient agreed with the plan and demonstrated an understanding of the instructions.   The patient was advised to call back or seek an in-person evaluation if the symptoms worsen or if the condition fails to improve as anticipated.   BH MD OP Progress Note  10/08/2023 3:19 PM Tamara Garrett  MRN:  [id number]  Chief Complaint:  Chief Complaint  Patient presents with   Follow-up   Anxiety   Depression   Medication Refill  Discussed the use of AI scribe software for clinical note transcription with the patient, who gave verbal consent to proceed.  History of Present Illness Tamara Garrett is a 58 year old Caucasian female on disability, married, lives in [place], has a history of MDD, panic disorder, gastric bypass, sensorineural deafness, cerebrovascular disease, recurrent UTIs, chronic nausea, cognitive issue, back pain was evaluated by telemedicine today.  She reports that her depression is in remission and describes significant improvement in mood, motivation, and interest in activities compared to her previous state. Increased engagement in activities includes getting out of the house, visiting her sister and sister-in-law, attending her grandchildren's ball games, and recently taking a weekend trip to the mountains with family. She identifies her  sister's upcoming move as a stressor but continues to participate in social activities. She reports that sleep remains satisfactory.  She denies any thoughts about harming herself or others. She continues to see her therapist, Ms.Gainey.  Ongoing memory difficulties persist per report.  She appeared to be alert, oriented to person place time and situation.  3 word memory immediate 3 out of 3, after 5 minutes 2 out of 3.  Attention and focus seem to be good in session.  Her current medication regimen includes fluoxetine  60 mg daily (taken as 40 mg and 20 mg), trazodone as needed for sleep (not used every night), buspirone  30 mg twice daily, and aripiprazole  5 mg daily. She denies any side effects from aripiprazole , including muscle spasms or tremors.  She denies any other concerns today.    Visit Diagnosis:    ICD-10-CM   1. Recurrent major depressive disorder, in full remission (HCC)  F33.42     2. Panic disorder  F41.0       Past Psychiatric History: I have reviewed past psychiatric history from progress note on 11/13/2022.  Past trials of medications like Adderall, clonazepam , Prozac , BuSpar , propranolol , trazodone, venlafaxine, Seroquel  Past Medical History:  Past Medical History:  Diagnosis Date   Anxiety    Chronic kidney disease    kidney stones   GERD (gastroesophageal reflux disease)     Past Surgical History:  Procedure Laterality Date   ABDOMINAL HYSTERECTOMY     BREAST BIOPSY Left 2005   benign   COLONOSCOPY     COLONOSCOPY WITH PROPOFOL  N/A 04/02/2016   Procedure: COLONOSCOPY WITH PROPOFOL ;  Surgeon: Gladis RAYMOND Mariner, MD;  Location: [facility] ENDOSCOPY;  Service: Endoscopy;  Laterality: N/A;   KIDNEY STONE SURGERY Right    LAPAROSCOPIC GASTRIC BYPASS      Family Psychiatric History: I have reviewed family psychiatric history from progress note on 11/13/2022.  Family History:  Family History  Problem Relation Age of Onset   Colon polyps Mother    COPD Mother     Congestive Heart Failure Mother    Diabetes Mother    Dementia Father    Stroke Father     Social History: I have reviewed social history from progress note on 11/13/2022. Social History   Socioeconomic History   Marital status: Married    Spouse name: Not on file   Number of children: 1   Years of education: Not on file   Highest education level: Some college, no degree  Occupational History   Not on file  Tobacco Use   Smoking status: Every Day    Current packs/day: 1.00    Types: Cigarettes   Smokeless tobacco: Never  Vaping Use   Vaping status: Former  Substance and Sexual Activity   Alcohol use: No   Drug use: No   Sexual activity: Not Currently  Other Topics Concern   Not on file  Social History Narrative   Not on file   Social Drivers of Health   Financial Resource Strain: High Risk (02/20/2023)   Overall Financial Resource Strain (CARDIA)    Difficulty of Paying Living Expenses: Hard  Food Insecurity: No Food Insecurity (02/20/2023)   Hunger Vital Sign    Worried About Running Out of Food in the Last Year: Never true    Ran Out of Food in the Last Year: Never true  Transportation Needs: Unmet Transportation Needs (02/20/2023)   PRAPARE - Transportation    Lack of Transportation (Medical): Yes    Lack of Transportation (Non-Medical): Yes  Physical Activity: Insufficiently Active (02/20/2023)   Exercise Vital Sign    Days of Exercise per Week: 1 day    Minutes of Exercise per Session: 10 min  Stress: Stress Concern Present (02/20/2023)   [employer] of Occupational Health - Occupational Stress Questionnaire    Feeling of Stress : Very much  Social Connections: Socially Isolated (02/20/2023)   Social Connection and Isolation Panel    Frequency of Communication with Friends and Family: Twice a week    Frequency of Social Gatherings with Friends and Family: Never    Attends Religious Services: Never    Database administrator or Organizations: No     Attends Banker Meetings: Never    Marital Status: Married    Allergies:  Allergies  Allergen Reactions   Nsaids Other (See Comments)    Patient has had gastric bypass.     Other     Had Gastric Bypass   Sulfamethoxazole Other (See Comments)   Sulfamethoxazole-Trimethoprim Other (See Comments)   Trimethoprim Other (See Comments)    Metabolic Disorder Labs: Lab Results  Component Value Date   HGBA1C 5.6 05/28/2019   No results found for: PROLACTIN Lab Results  Component Value Date   CHOL 217 (H) 05/28/2019   TRIG 236 (H) 05/28/2019   HDL 36 (L) 05/28/2019   LDLCALC 138 (H) 05/28/2019   Lab Results  Component Value Date   TSH 2.340 01/21/2019    Therapeutic Level Labs: No results found for: LITHIUM No results found for: VALPROATE No results found for: CBMZ  Current Medications: Current Outpatient Medications  Medication Sig Dispense Refill   FLUoxetine  (  PROZAC ) 40 MG capsule Take 2 capsules by mouth daily. (Patient taking differently: Take 40 mg by mouth daily.)     ARIPiprazole  (ABILIFY ) 5 MG tablet TAKE 1 TABLET (5 MG TOTAL) BY MOUTH IN THE MORNING 90 tablet 1   aspirin EC 81 MG tablet Take 1 tablet by mouth daily.     busPIRone  (BUSPAR ) 30 MG tablet Take 1 tablet (30 mg total) by mouth 2 (two) times daily.     clonazePAM  (KLONOPIN ) 0.5 MG tablet Take 0.5-1 tablets (0.25-0.5 mg total) by mouth as directed. Take half to one tablet once a day as needed for severe panic attacks only, please give atleast 8 hours gap with your oxycodone medication, do not combine. 8 tablet 0   diphenoxylate -atropine  (LOMOTIL ) 2.5-0.025 MG tablet Take 1 tablet by mouth 4 (four) times daily as needed for diarrhea or loose stools. 30 tablet 0   FLUoxetine  (PROZAC ) 20 MG capsule TAKE 1 CAPSULE (20 MG TOTAL) BY MOUTH DAILY. TAKE ALONG WITH 40 MG DAILY ( HAS SUPPLIES OF 40 MG ) 90 capsule 1   gabapentin (NEURONTIN) 600 MG tablet 600 mg. Take 1 tab 3-4 times daily.      HYDROcodone-acetaminophen (NORCO) 10-325 MG tablet take 1 tablet by oral route  every 8 hours as needed for pain (DNF 04/21/23)     levothyroxine (SYNTHROID) 50 MCG tablet Take 50 mcg by mouth daily.     meclizine  (ANTIVERT ) 25 MG tablet Take 25 mg by mouth 3 (three) times daily as needed.     omeprazole  (PRILOSEC) 20 MG capsule TAKE 1 CAPSULE BY MOUTH EVERY DAY 90 capsule 1   phenazopyridine  (PYRIDIUM ) 200 MG tablet Take by mouth.     promethazine  (PHENERGAN ) 12.5 MG tablet Take by mouth.     propranolol  (INDERAL ) 10 MG tablet TAKE 1 TABLET (10 MG TOTAL) BY MOUTH 2 (TWO) TIMES DAILY AS NEEDED. FOR ANXIETY 180 tablet 1   rosuvastatin  (CRESTOR ) 20 MG tablet TAKE 1 TABLET BY MOUTH EVERY DAY 90 tablet 1   traZODone (DESYREL) 50 MG tablet Take 50 mg by mouth at bedtime as needed.     No current facility-administered medications for this visit.     Musculoskeletal: Strength & Muscle Tone: UTA Gait & Station: Seated Patient leans: N/A  Psychiatric Specialty Exam: Review of Systems  Psychiatric/Behavioral:  The patient is nervous/anxious.     There were no vitals taken for this visit.There is no height or weight on file to calculate BMI.  General Appearance: Casual  Eye Contact:  Fair  Speech:  Clear and Coherent  Volume:  Normal  Mood:  Anxious  Affect:  Appropriate  Thought Process:  Goal Directed and Descriptions of Associations: Intact  Orientation:  Full (Time, Place, and Person)  Thought Content: Logical   Suicidal Thoughts:  No  Homicidal Thoughts:  No  Memory:  Immediate;   Fair Recent;   Fair Remote;   Fair  Judgement:  Fair  Insight:  Fair  Psychomotor Activity:  Normal  Concentration:  Concentration: Fair and Attention Span: Fair  Recall:  [employer] of Knowledge: Fair  Language: Fair  Akathisia:  No  Handed:  Right  AIMS (if indicated): not done  Assets:  Communication Skills Desire for Improvement Housing Social Support Transportation  ADL's:  Intact   Cognition: WNL  Sleep:  Fair   Screenings: AIMS    Flowsheet Row Office Visit from 12/03/2022 in [facility]  AIMS Total Score 0  GAD-7    Flowsheet Row Counselor from 02/20/2023 in [facility] Office Visit from 11/13/2022 in [facility]  Total GAD-7 Score 7 8   PHQ2-9    Flowsheet Row Video Visit from 07/14/2023 in [facility] Counselor from 02/20/2023 in [facility] Office Visit from 12/03/2022 in [facility] Office Visit from 11/13/2022 in [facility] Office Visit from 03/02/2020 in [facility]  PHQ-2 Total Score 0 6 4 5 6   PHQ-9 Total Score -- 18 11 17 24    Flowsheet Row Video Visit from 10/08/2023 in [facility] Video Visit from 07/14/2023 in [facility] Video Visit from 04/17/2023 in [facility]  C-SSRS RISK CATEGORY No Risk No Risk No Risk     Assessment and Plan: Alicen Donalson is a 58 year old Caucasian female with multiple medical problems was evaluated by telemedicine today.  Discussed assessment and plan as noted below.  1. Recurrent major depressive disorder, in full remission (HCC) Currently reports depression symptoms are stable.  Denies any significant concerns with medication regimen. Continue Prozac  60 mg daily Continue Abilify  5 mg daily. Continue BuSpar  30 mg twice daily. Continue Trazodone 50 mg at bedtime as needed Continue CBT with Ms. Almarie Ligas  2. Panic disorder-stable Currently reports overall anxiety symptoms is improved and denies any recent panic attacks Continue Prozac  60 mg daily Continue BuSpar  30 mg twice daily Continue  Propranolol  10 mg twice a day as needed for severe anxiety attacks Continue Clonazepam  0.5 mg as needed for severe panic attacks  Follow-up Follow-up in clinic in 2 to 3 months or sooner in person.  Collaboration of Care: Collaboration of Care: Referral or follow-up with counselor/therapist AEB and courage to continue psychotherapy sessions.  Patient/Guardian was advised Release of Information must be obtained prior to any record release in order to collaborate their care with an outside provider. Patient/Guardian was advised if they have not already done so to contact the registration department to sign all necessary forms in order for us  to release information regarding their care.   Consent: Patient/Guardian gives verbal consent for treatment and assignment of benefits for services provided during this visit. Patient/Guardian expressed understanding and agreed to proceed.   This note was generated in part or whole with voice recognition software. Voice recognition is usually quite accurate but there are transcription errors that can and very often do occur. I apologize for any typographical errors that were not detected and corrected.    Deseray Daponte, MD 10/10/2023, 5:38 AM

## 2023-10-27 ENCOUNTER — Ambulatory Visit (INDEPENDENT_AMBULATORY_CARE_PROVIDER_SITE_OTHER): Admitting: Professional Counselor

## 2023-10-27 DIAGNOSIS — F410 Panic disorder [episodic paroxysmal anxiety] without agoraphobia: Secondary | ICD-10-CM | POA: Diagnosis not present

## 2023-10-27 DIAGNOSIS — F3342 Major depressive disorder, recurrent, in full remission: Secondary | ICD-10-CM | POA: Diagnosis not present

## 2023-10-27 NOTE — Progress Notes (Unsigned)
 THERAPIST PROGRESS NOTE  Virtual Visit via Video Note  I connected with Tamara Garrett on 10/28/23 at  1:00 PM EDT by a video enabled telemedicine application and verified that I am speaking with the correct person using two identifiers.  Location: Patient: Home Provider: Remote office   I discussed the limitations of evaluation and management by telemedicine and the availability of in person appointments. The patient expressed understanding and agreed to proceed.   I discussed the assessment and treatment plan with the patient. The patient was provided an opportunity to ask questions and all were answered. The patient agreed with the plan and demonstrated an understanding of the instructions.   The patient was advised to call back or seek an in-person evaluation if the symptoms worsen or if the condition fails to improve as anticipated.  I provided 41 minutes of non-face-to-face time during this encounter. Tamara Garrett, [facility]  Session Time: 1:03 PM - 1:44 PM   Participation Level: Active  Behavioral Response: Casual, Alert, Dysphoric  Type of Therapy: Individual Therapy  Treatment Goals addressed: Active OP Depression  LTG: I'm just stuck in this setting, in this chair, and in this depression, even with medication I haven't been able to pull myself out of it. I give up so easy now. I used to be the main person in our family that stood up and took over everything. (Completed/Met)                Start:  03/03/23    Expected End:  03/01/24    Resolved:  09/29/23 Goal Note Reviewed 09/29/23 - Yeah, I feel like I've done pretty good on that. Its taken me awhile but getting out. I don't do it everyday but I get out more than I have in the last several years. So I do feel good about that.    STG: I really need to focus on getting my house back in order. I just let it go and I do the bare minimum. To manage ADLs AEB utilizing coping strategies to complete tasks on a daily  basis over the next 90 days.  (Progressing)  Goal Note Reviewed 09/29/23 - We've done a little bit. We had to put a new roof on. That was a lot of fun. But as far as inside the house. I've done some work in here. Nothing like I should have. I just try to work on it some days and some days I don't.   STG: I don't let things out. I just say everything's okay, everything's fine. To acknowledge and process emotions AEB engaging in talk therapy and sharing thoughts/feelings within personal relationships over the next 12 weeks.  (Progressing)  Goal Note Reviewed 09/29/23 - I don't know. I still feel like I do it. I just always have. That's who I've always been so it's just hard. It's even big for me to have a therapist because everything is always alright. But I've asked for help more. I still feel like I keep things in.  ProgressTowards Goals: Progressing  Interventions: CBT, Motivational Interviewing, and Supportive  Summary: Tamara Garrett is a 58 y.o. female who presents with a history of anxiety and depression. She appeared somber but oriented x5. She reported she hasn't done as much the past few weeks as she did prior to last session. She was unable to quit smoking by her quit date and hasn't been able to cut back anymore. Tamara Garrett was receptive to continuing to practice urge  surfing with this. She discussed helping her sister get ready for the move and noted family dynamics with communication. Tamara Garrett expressed concerns about trying to be more open with her sister. She continues to struggle with independence as she hasn't driving much since her health issues began. However, she has started some and was open to gradually adding more time to build trust within herself and for others to feel safe with her driving as well.   Therapist Response: Conducted session with Tamara Garrett. Began session with check-in/update since previous session. Utilized empathetic and reflective listening. Used open-ended questions to  facilitate discussion and summarized Tamara Garrett's thoughts/feelings. Reminded Tamara Garrett about urge surfing for smoking cessation. Explored family dynamics and highlighted ways Tamara Garrett's behavior has also shaped other's behaviors. Normalized need for independence and discussed gradual exposure to gain confidence and trust within self and safety for others. Scheduled additional appointment and concluded session.   Suicidal/Homicidal: No  Plan: Return again in 4 weeks.  Diagnosis: Recurrent major depressive disorder, in full remission  Panic disorder  Collaboration of Care: Medication Management AEB chart review  Patient/Guardian was advised Release of Information must be obtained prior to any record release in order to collaborate their care with an outside provider. Patient/Guardian was advised if they have not already done so to contact the registration department to sign all necessary forms in order for us  to release information regarding their care.   Consent: Patient/Guardian gives verbal consent for treatment and assignment of benefits for services provided during this visit. Patient/Guardian expressed understanding and agreed to proceed.   Tamara Garrett, [facility] 10/28/2023

## 2023-11-24 ENCOUNTER — Ambulatory Visit (INDEPENDENT_AMBULATORY_CARE_PROVIDER_SITE_OTHER): Admitting: Professional Counselor

## 2023-11-24 DIAGNOSIS — F410 Panic disorder [episodic paroxysmal anxiety] without agoraphobia: Secondary | ICD-10-CM

## 2023-11-24 DIAGNOSIS — F3342 Major depressive disorder, recurrent, in full remission: Secondary | ICD-10-CM

## 2023-11-24 NOTE — Progress Notes (Signed)
 THERAPIST PROGRESS NOTE  Virtual Visit via Video Note  I connected with Tamara Garrett on 11/24/23 at  1:00 PM EST by a video enabled telemedicine application and verified that I am speaking with the correct person using two identifiers.  Location: Patient: Home Provider: Remote office   I discussed the limitations of evaluation and management by telemedicine and the availability of in person appointments. The patient expressed understanding and agreed to proceed.   I discussed the assessment and treatment plan with the patient. The patient was provided an opportunity to ask questions and all were answered. The patient agreed with the plan and demonstrated an understanding of the instructions.   The patient was advised to call back or seek an in-person evaluation if the symptoms worsen or if the condition fails to improve as anticipated.  I provided 25 minutes of non-face-to-face time during this encounter. Tamara Garrett, [facility]  Session Time: 1:00 PM - 1:30 PM   Participation Level: Active  Behavioral Response: Casual, Alert, Euthymic  Type of Therapy: Individual Therapy  Treatment Goals addressed: Active OP Depression  LTG: I'm just stuck in this setting, in this chair, and in this depression, even with medication I haven't been able to pull myself out of it. I give up so easy now. I used to be the main person in our family that stood up and took over everything. (Completed/Met)                Start:  03/03/23    Expected End:  03/01/24    Resolved:  09/29/23 Goal Note Reviewed 09/29/23 - Yeah, I feel like I've done pretty good on that. Its taken me awhile but getting out. I don't do it everyday but I get out more than I have in the last several years. So I do feel good about that.    STG: I really need to focus on getting my house back in order. I just let it go and I do the bare minimum. To manage ADLs AEB utilizing coping strategies to complete tasks on a daily  basis over the next 90 days.  (Progressing)  Goal Note Reviewed 09/29/23 - We've done a little bit. We had to put a new roof on. That was a lot of fun. But as far as inside the house. I've done some work in here. Nothing like I should have. I just try to work on it some days and some days I don't.   STG: I don't let things out. I just say everything's okay, everything's fine. To acknowledge and process emotions AEB engaging in talk therapy and sharing thoughts/feelings within personal relationships over the next 12 weeks.  (Progressing)  Goal Note Reviewed 09/29/23 - I don't know. I still feel like I do it. I just always have. That's who I've always been so it's just hard. It's even big for me to have a therapist because everything is always alright. But I've asked for help more. I still feel like I keep things in.  ProgressTowards Goals: Progressing  Interventions: Motivational Interviewing and Supportive  Summary: Tamara Garrett is a 57 y.o. female who presents with a history of depression and panic disorder. She appeared alert and oriented x5. She repored she's not been feeling well but has started antibiotics to help. Tamara Garrett stated she hasn't really said anything to her sister for fear of hurting her feelings. Her sister is still in the process of moving. Tamara Garrett reported potential plans for Thanksgiving and  Christmas. She requested to end session early due to having a conflicting appointment at 1:30 PM.  Therapist Response: Conducted session with Tamara Garrett. Began session with check-in/update since previous session. Utilized empathetic and reflective listening. Used open-ended questions to facilitate discussion and summarized Tamara Garrett's  thoughts/feelings. Scheduled additional appointment and concluded session.   Suicidal/Homicidal: No  Plan: Return again in 4 weeks.  Diagnosis: Recurrent major depressive disorder, in full remission  Panic disorder  Collaboration of Care: Medication Management  AEB chart review  Patient/Guardian was advised Release of Information must be obtained prior to any record release in order to collaborate their care with an outside provider. Patient/Guardian was advised if they have not already done so to contact the registration department to sign all necessary forms in order for us  to release information regarding their care.   Consent: Patient/Guardian gives verbal consent for treatment and assignment of benefits for services provided during this visit. Patient/Guardian expressed understanding and agreed to proceed.   Tamara Garrett, [facility] 11/24/2023

## 2023-12-22 ENCOUNTER — Ambulatory Visit: Admitting: Professional Counselor

## 2023-12-22 DIAGNOSIS — F410 Panic disorder [episodic paroxysmal anxiety] without agoraphobia: Secondary | ICD-10-CM | POA: Diagnosis not present

## 2023-12-22 DIAGNOSIS — F3342 Major depressive disorder, recurrent, in full remission: Secondary | ICD-10-CM | POA: Diagnosis not present

## 2023-12-22 NOTE — Progress Notes (Signed)
 THERAPIST PROGRESS NOTE  Virtual Visit via Video Note  I connected with [name] on 12/22/23 at 11:00 AM EST by a video enabled telemedicine application and verified that I am speaking with the correct person using two identifiers.  Location: Patient: Home Provider: Remote office   I discussed the limitations of evaluation and management by telemedicine and the availability of in person appointments. The patient expressed understanding and agreed to proceed.   I discussed the assessment and treatment plan with the patient. The patient was provided an opportunity to ask questions and all were answered. The patient agreed with the plan and demonstrated an understanding of the instructions.   The patient was advised to call back or seek an in-person evaluation if the symptoms worsen or if the condition fails to improve as anticipated.  I provided 31 minutes of non-face-to-face time during this encounter. [name], [facility]  Session Time: 11:00 AM - 11:31 AM  Participation Level: Active  Behavioral Response: Well Groomed, Alert, Euthymic  Type of Therapy: Individual Therapy  Treatment Goals addressed: Active OP Depression  LTG: I'm just stuck in this setting, in this chair, and in this depression, even with medication I haven't been able to pull myself out of it. I give up so easy now. I used to be the main person in our family that stood up and took over everything. (Completed/Met)                Start:  03/03/23    Expected End:  03/01/24    Resolved:  09/29/23 Goal Note Reviewed 09/29/23 - Yeah, I feel like I've done pretty good on that. Its taken me awhile but getting out. I don't do it everyday but I get out more than I have in the last several years. So I do feel good about that.    STG: I really need to focus on getting my house back in order. I just let it go and I do the bare minimum. To manage ADLs AEB utilizing coping strategies to complete tasks on a  daily basis over the next 90 days.  (Progressing)  Goal Note Reviewed 09/29/23 - We've done a little bit. We had to put a new roof on. That was a lot of fun. But as far as inside the house. I've done some work in here. Nothing like I should have. I just try to work on it some days and some days I don't.   STG: I don't let things out. I just say everything's okay, everything's fine. To acknowledge and process emotions AEB engaging in talk therapy and sharing thoughts/feelings within personal relationships over the next 12 weeks.  (Progressing)  Goal Note Reviewed 09/29/23 - I don't know. I still feel like I do it. I just always have. That's who I've always been so it's just hard. It's even big for me to have a therapist because everything is always alright. But I've asked for help more. I still feel like I keep things in.  ProgressTowards Goals: Progressing  Interventions: Motivational Interviewing and Supportive  Summary: Pamela Intrieri is a 58 y.o. female who presents with a history of depression and panic disorder. She appeared alert and oriented x5. She reported they enjoyed Thanksgiving with her grandchildren. She has been dealing with a recurring UTI, which has impacted her energy/activity levels but she plans to follow up with her doctor today. Kymorah noted things are going well overall. She has been keeping in touch with  her sister who moved away. They plan to all get together for Christmas. Lorice is still working on smoking cessation but has cut back some more. She plans to continue with this.   Therapist Response: Conducted session with [name]. Began session with check-in/update since previous session. Utilized empathetic and reflective listening. Used open-ended questions to facilitate discussion and summarized Linn's thoughts/feelings. Used positive reinforcement about smoking cessation and change talk. Scheduled additional appointment and concluded session.   Suicidal/Homicidal:  No  Plan: Return again in 4 weeks.  Diagnosis: Panic disorder  Recurrent major depressive disorder, in full remission  Collaboration of Care: Medication Management AEB chart review  Patient/Guardian was advised Release of Information must be obtained prior to any record release in order to collaborate their care with an outside provider. Patient/Guardian was advised if they have not already done so to contact the registration department to sign all necessary forms in order for us  to release information regarding their care.   Consent: Patient/Guardian gives verbal consent for treatment and assignment of benefits for services provided during this visit. Patient/Guardian expressed understanding and agreed to proceed.   [name], [facility] 12/22/2023

## 2023-12-23 ENCOUNTER — Ambulatory Visit: Admitting: Professional Counselor

## 2024-01-05 ENCOUNTER — Ambulatory Visit: Admitting: Psychiatry

## 2024-01-05 ENCOUNTER — Other Ambulatory Visit: Payer: Self-pay

## 2024-01-05 ENCOUNTER — Encounter: Payer: Self-pay | Admitting: Psychiatry

## 2024-01-05 VITALS — BP 102/67 | HR 56 | Temp 97.8°F | Ht 64.0 in | Wt 171.0 lb

## 2024-01-05 DIAGNOSIS — Z9189 Other specified personal risk factors, not elsewhere classified: Secondary | ICD-10-CM

## 2024-01-05 DIAGNOSIS — F410 Panic disorder [episodic paroxysmal anxiety] without agoraphobia: Secondary | ICD-10-CM

## 2024-01-05 DIAGNOSIS — F330 Major depressive disorder, recurrent, mild: Secondary | ICD-10-CM | POA: Diagnosis not present

## 2024-01-05 MED ORDER — CLONAZEPAM 0.5 MG PO TABS: 0.2500 mg | ORAL_TABLET | ORAL | 0 refills | Status: AC

## 2024-01-05 MED ORDER — FLUOXETINE HCL 40 MG PO CAPS: 80.0000 mg | ORAL_CAPSULE | Freq: Every day | ORAL | 1 refills | Status: AC

## 2024-01-05 NOTE — Patient Instructions (Signed)
 Please call for [phone]

## 2024-01-05 NOTE — Progress Notes (Unsigned)
 [facility] MD OP Progress Note  01/05/2024 9:38 AM Tamara Garrett  MRN:  [id number]  Chief Complaint:  Chief Complaint  Patient presents with   Follow-up   Anxiety   Depression   Medication Refill   Discussed the use of AI scribe software for clinical note transcription with the patient, who gave verbal consent to proceed.  History of Present Illness Tamara Garrett is a 58 year old Caucasian female on disability, married, lives in [place], has a history of panic disorder, gastric bypass, sensorineural deafness, cerebrovascular disease, recurrent UTIs, chronic nausea, cognitive issues, back pain was evaluated at the office today.  She reports ongoing symptoms of depression, including persistent low mood, lack of interest and pleasure in activities, and feelings of helplessness. She reports little to no desire to engage in activities she previously enjoyed, though she is making efforts to become more involved by deciding to host Christmas this year. She thinks her lack of interest may have a lot to do with her depression. She denies feelings of hopelessness at this time. She reports that she is working with her therapist to increase her activity level and involvement with family.  She reports her anxiety is overall manageable.  She takes clonazepam  as prescribed but states she does not need it often and is currently out of it. She also takes propranolol , which she reports taking every morning and sometimes twice daily.she reports she did not know it was supposed to be taken as needed.  She denies recent panic attacks.  She reports sleep is generally adequate, though she sometimes uses trazodone to help her sleep. She states her appetite is good. She reports some memory difficulties, which she considers normal for her age, but she reports no difficulty managing her finances or daily tasks.  She denies suicidal thoughts, thoughts of self-harm, or thoughts of harming others.  She reports her  current medication regimen includes Fluoxetine , Abilify , Buspar , Clonazepam , and Propranolol  (taken daily, though prescribed as needed for anxiety). She also reports taking Trazodone as needed for sleep.  She reported she continues to be under the care of Ms. Almarie Ligas, for psychotherapy.  She reports current tobacco use, specifically cigarettes. She previously smoked approximately 1 pack per day, has reduced to half a pack per day, and currently smokes about 5 cigarettes per day.  She plans to host Christmas at her home for her family, including her sisters, nephews, and son.   She currently has UTI symptoms which also could be contributing to her current mood symptoms.  She agrees to get in touch with her provider for management of the same.   Visit Diagnosis:    ICD-10-CM   1. MDD (major depressive disorder), recurrent episode, mild  F33.0 FLUoxetine  (PROZAC ) 40 MG capsule    2. Panic disorder  F41.0 FLUoxetine  (PROZAC ) 40 MG capsule    clonazePAM  (KLONOPIN ) 0.5 MG tablet    3. At risk for prolonged QT interval syndrome  Z91.89 EKG 12-Lead      Past Psychiatric History: Reviewed past psychiatric history from progress note on 11/13/2022.  Past trials of medications like Adderall, clonazepam , Prozac , BuSpar , propranolol , trazodone, venlafaxine, Seroquel.  Past Medical History:  Past Medical History:  Diagnosis Date   Anxiety    Chronic kidney disease    kidney stones   GERD (gastroesophageal reflux disease)     Past Surgical History:  Procedure Laterality Date   ABDOMINAL HYSTERECTOMY     BREAST BIOPSY Left 2005   benign   COLONOSCOPY  COLONOSCOPY WITH PROPOFOL  N/A 04/02/2016   Procedure: COLONOSCOPY WITH PROPOFOL ;  Surgeon: Gladis RAYMOND Mariner, MD;  Location: [facility] ENDOSCOPY;  Service: Endoscopy;  Laterality: N/A;   KIDNEY STONE SURGERY Right    LAPAROSCOPIC GASTRIC BYPASS      Family Psychiatric History: Reviewed family psychiatric history from progress note on  11/13/2022.  Family History:  Family History  Problem Relation Age of Onset   Colon polyps Mother    COPD Mother    Congestive Heart Failure Mother    Diabetes Mother    Dementia Father    Stroke Father     Social History: I have reviewed social history from progress note on 11/13/2022. Social History   Socioeconomic History   Marital status: Married    Spouse name: Not on file   Number of children: 1   Years of education: Not on file   Highest education level: Some college, no degree  Occupational History   Not on file  Tobacco Use   Smoking status: Every Day    Current packs/day: 1.00    Types: Cigarettes   Smokeless tobacco: Never  Vaping Use   Vaping status: Former  Substance and Sexual Activity   Alcohol use: No   Drug use: No   Sexual activity: Not Currently  Other Topics Concern   Not on file  Social History Narrative   Not on file   Social Drivers of Health   Tobacco Use: High Risk (01/05/2024)   Patient History    Smoking Tobacco Use: Every Day    Smokeless Tobacco Use: Never    Passive Exposure: Not on file  Financial Resource Strain: Medium Risk (11/15/2023)   Received from [facility]   Overall Financial Resource Strain (CARDIA)    How hard is it for you to pay for the very basics like food, housing, medical care, and heating?: Somewhat hard  Food Insecurity: No Food Insecurity (11/15/2023)   Received from [facility]   Epic    Within the past 12 months, you worried that your food would run out before you got the money to buy more.: Never true    Within the past 12 months, the food you bought just didn't last and you didn't have money to get more.: Never true  Transportation Needs: No Transportation Needs (11/15/2023)   Received from [facility] - Transportation    Lack of Transportation (Medical): No    Lack of Transportation (Non-Medical): No  Physical Activity: Insufficiently Active (02/20/2023)   Exercise Vital Sign     Days of Exercise per Week: 1 day    Minutes of Exercise per Session: 10 min  Stress: Stress Concern Present (02/20/2023)   [employer] of [employer] - Occupational Stress Questionnaire    Feeling of Stress : Very much  Social Connections: Socially Isolated (02/20/2023)   Social Connection and Isolation Panel    Frequency of Communication with Friends and Family: Twice a week    Frequency of Social Gatherings with Friends and Family: Never    Attends Religious Services: Never    Database Administrator or Organizations: No    Attends Banker Meetings: Never    Marital Status: Married  Depression (PHQ2-9): Medium Risk (01/05/2024)   Depression (PHQ2-9)    PHQ-2 Score: 10  Alcohol Screen: Low Risk (02/20/2023)   Alcohol Screen    Last Alcohol Screening Score (AUDIT): 0  Housing: Low Risk (02/20/2023)   Housing Stability Vital  Sign    Unable to Pay for Housing in the Last Year: No    Number of Times Moved in the Last Year: 0    Homeless in the Last Year: No  Utilities: High Risk (11/15/2023)   Received from [facility]   Utilities    Within the past 12 months, have you been unable to get utilities(heat, electricity) when it was really needed?: Yes  Health Literacy: Inadequate Health Literacy (02/20/2023)   B1300 Health Literacy    Frequency of need for help with medical instructions: Sometimes    Allergies: Allergies[1]  Metabolic Disorder Labs: Lab Results  Component Value Date   HGBA1C 5.6 05/28/2019   No results found for: PROLACTIN Lab Results  Component Value Date   CHOL 217 (H) 05/28/2019   TRIG 236 (H) 05/28/2019   HDL 36 (L) 05/28/2019   LDLCALC 138 (H) 05/28/2019   Lab Results  Component Value Date   TSH 2.340 01/21/2019    Therapeutic Level Labs: No results found for: LITHIUM No results found for: VALPROATE No results found for: CBMZ  Current Medications: Current Outpatient Medications  Medication Sig Dispense Refill    ARIPiprazole  (ABILIFY ) 5 MG tablet TAKE 1 TABLET (5 MG TOTAL) BY MOUTH IN THE MORNING 90 tablet 1   aspirin EC 81 MG tablet Take 1 tablet by mouth daily.     busPIRone  (BUSPAR ) 30 MG tablet Take 1 tablet (30 mg total) by mouth 2 (two) times daily.     clonazePAM  (KLONOPIN ) 0.5 MG tablet Take 0.5-1 tablets (0.25-0.5 mg total) by mouth as directed. Take half to one tablet once a day as needed for severe panic attacks only, please give atleast 8 hours gap with your hydrocodone medication, do not combine. 8 tablet 0   diphenoxylate -atropine  (LOMOTIL ) 2.5-0.025 MG tablet Take 1 tablet by mouth 4 (four) times daily as needed for diarrhea or loose stools. 30 tablet 0   FLUoxetine  (PROZAC ) 40 MG capsule Take 2 capsules (80 mg total) by mouth daily. Stop the Fluoxetine  20 mg , dose change 180 capsule 1   gabapentin (NEURONTIN) 600 MG tablet 600 mg. Take 1 tab 3-4 times daily.     HYDROcodone-acetaminophen (NORCO) 10-325 MG tablet take 1 tablet by oral route  every 8 hours as needed for pain (DNF 04/21/23)     levothyroxine (SYNTHROID) 50 MCG tablet Take 50 mcg by mouth daily.     meclizine  (ANTIVERT ) 25 MG tablet Take 25 mg by mouth 3 (three) times daily as needed.     omeprazole  (PRILOSEC) 20 MG capsule TAKE 1 CAPSULE BY MOUTH EVERY DAY 90 capsule 1   phenazopyridine  (PYRIDIUM ) 200 MG tablet Take by mouth.     promethazine  (PHENERGAN ) 12.5 MG tablet Take by mouth.     propranolol  (INDERAL ) 10 MG tablet TAKE 1 TABLET (10 MG TOTAL) BY MOUTH 2 (TWO) TIMES DAILY AS NEEDED. FOR ANXIETY 180 tablet 1   rosuvastatin  (CRESTOR ) 20 MG tablet TAKE 1 TABLET BY MOUTH EVERY DAY 90 tablet 1   traZODone (DESYREL) 50 MG tablet Take 50 mg by mouth at bedtime as needed.     No current facility-administered medications for this visit.     Musculoskeletal: Strength & Muscle Tone: within normal limits Gait & Station: normal Patient leans: N/A  Psychiatric Specialty Exam: Review of Systems  Psychiatric/Behavioral:   The patient is nervous/anxious.     Blood pressure 102/67, pulse (!) 56, temperature 97.8 F (36.6 C), temperature source Temporal, height 5' 4 (  1.626 m), weight 171 lb (77.6 kg).Body mass index is 29.35 kg/m.  General Appearance: Casual  Eye Contact:  Fair  Speech:  Normal Rate  Volume:  Normal  Mood:  Anxious  Affect:  Appropriate  Thought Process:  Goal Directed and Descriptions of Associations: Intact  Orientation:  Full (Time, Place, and Person)  Thought Content: Logical   Suicidal Thoughts:  No  Homicidal Thoughts:  No  Memory:  Immediate;   Fair Recent;   Fair Remote;   Fair  Judgement:  Fair  Insight:  Fair  Psychomotor Activity:  Normal  Concentration:  Concentration: Fair and Attention Span: Fair  Recall:  [employer] of Knowledge: Fair  Language: Fair  Akathisia:  No  Handed:  Right  AIMS (if indicated): not done  Assets:  Manufacturing Systems Engineer Desire for Improvement Housing Social Support Transportation  ADL's:  Intact  Cognition: WNL  Sleep:  Fair   Screenings: Geneticist, Molecular Office Visit from 12/03/2022 in [facility]  AIMS Total Score 0   GAD-7    Flowsheet Row Office Visit from 01/05/2024 in [facility] Counselor from 02/20/2023 in [facility] Office Visit from 11/13/2022 in [facility]  Total GAD-7 Score 4 7 8    PHQ2-9    Flowsheet Row Office Visit from 01/05/2024 in [facility] Video Visit from 07/14/2023 in [facility] Counselor from 02/20/2023 in [facility] Office Visit from 12/03/2022 in [facility] Office Visit from 11/13/2022 in [facility]  PHQ-2 Total Score 3 0 6 4 5   PHQ-9  Total Score 10 -- 18 11 17    Flowsheet Row Video Visit from 10/08/2023 in [facility] Video Visit from 07/14/2023 in [facility] Video Visit from 04/17/2023 in [facility]  C-SSRS RISK CATEGORY No Risk No Risk No Risk     Assessment and Plan: Ave Scharnhorst is a 58 year old Caucasian female who presented for a follow-up appointment, discussed assessment and plan as noted below.  1. MDD (major depressive disorder), recurrent episode, mild-unstable Currently reports lack of motivation likely multifactorial including pain, chronic, recurrent UTI with current symptoms. Increase Fluoxetine  to 80 mg daily Continue Abilify  5 mg daily Continue BuSpar  30 mg twice a day Continue Trazodone 50 mg at bedtime Continue CBT with Ms. Almarie Ligas  2. Panic disorder-stable Currently denies any significant panic symptoms Continue Fluoxetine  80 mg daily as noted above Continue BuSpar  30 mg twice daily Continue Clonazepam  0.5 mg as needed for severe panic attacks.  Limiting use. Reviewed [place] PMP AWARxE Continue propranolol  10 mg twice a day as needed, encouraged to limit use.  Heart rate noted as in the lower range at 56 today.  Patient to monitor and contact primary care provider.  3. At risk for prolonged QT interval syndrome Will order an EKG, patient to get this done by calling [phone].  Will consider readjusting the dosage of Abilify  or discontinuing propranolol  as needed once EKG is reviewed.   Follow-up Follow-up in clinic in 4 weeks or sooner if needed.  Collaboration of Care: Collaboration of Care: Referral or follow-up with counselor/therapist AEB encouraged to continue psychotherapy sessions with Ms. Almarie Ligas  Patient/Guardian was advised Release of Information must be obtained prior to  any record release in order to collaborate their care with an outside  provider. Patient/Guardian was advised if they have not already done so to contact the registration department to sign all necessary forms in order for us  to release information regarding their care.   Consent: Patient/Guardian gives verbal consent for treatment and assignment of benefits for services provided during this visit. Patient/Guardian expressed understanding and agreed to proceed.  This note was generated in part or whole with voice recognition software. Voice recognition is usually quite accurate but there are transcription errors that can and very often do occur. I apologize for any typographical errors that were not detected and corrected.     Amram Maya, MD 01/05/2024, 9:38 AM     [1]  Allergies Allergen Reactions   Nsaids Other (See Comments)    Patient has had gastric bypass.     Other     Had Gastric Bypass   Sulfamethoxazole Other (See Comments)   Sulfamethoxazole-Trimethoprim Other (See Comments)   Trimethoprim Other (See Comments)

## 2024-01-11 ENCOUNTER — Other Ambulatory Visit: Payer: Self-pay | Admitting: Psychiatry

## 2024-01-11 DIAGNOSIS — F333 Major depressive disorder, recurrent, severe with psychotic symptoms: Secondary | ICD-10-CM

## 2024-01-19 ENCOUNTER — Ambulatory Visit: Admitting: Professional Counselor

## 2024-01-19 DIAGNOSIS — F330 Major depressive disorder, recurrent, mild: Secondary | ICD-10-CM | POA: Diagnosis not present

## 2024-01-19 NOTE — Progress Notes (Signed)
 " THERAPIST PROGRESS NOTE  Virtual Visit via Video Note  I connected with Tamara Garrett on 01/19/2024 at 11:00 AM EST by a video enabled telemedicine application and verified that I am speaking with the correct person using two identifiers.  Location: Patient: Home Provider: Remote office   I discussed the limitations of evaluation and management by telemedicine and the availability of in person appointments. The patient expressed understanding and agreed to proceed.   I discussed the assessment and treatment plan with the patient. The patient was provided an opportunity to ask questions and all were answered. The patient agreed with the plan and demonstrated an understanding of the instructions.   The patient was advised to call back or seek an in-person evaluation if the symptoms worsen or if the condition fails to improve as anticipated.  I provided 22 minutes of non-face-to-face time during this encounter. Tamara Garrett, [facility]  Session Time: 11:02 AM - 11:24 AM   Participation Level: Active  Behavioral Response: Casual, Alert, Dysphoric  Type of Therapy: Individual Therapy  Treatment Goals addressed: Active OP Depression  LTG: I'm just stuck in this setting, in this chair, and in this depression, even with medication I haven't been able to pull myself out of it. I give up so easy now. I used to be the main person in our family that stood up and took over everything. (Completed/Met)    Start:  03/03/23    Expected End:  03/01/24    Resolved:  09/29/23 Goal Note Reviewed 09/29/23 - Yeah, I feel like I've done pretty good on that. Its taken me awhile but getting out. I don't do it everyday but I get out more than I have in the last several years. So I do feel good about that.   STG: I really need to focus on getting my house back in order. I just let it go and I do the bare minimum. To manage ADLs AEB utilizing coping strategies to complete tasks on a daily basis over  the next 90 days.  (Progressing)  Goal Note Reviewed 01/19/24 - I had started on it, but Christmas came. I'll get back on that after the new year.   STG: I don't let things out. I just say everything's okay, everything's fine. To acknowledge and process emotions AEB engaging in talk therapy and sharing thoughts/feelings within personal relationships over the next 12 weeks.  (Progressing)  Goal Note Reviewed 01/19/24 - I still have that little issue. Agreed to start journaling to get in practice of writing what is true.   ProgressTowards Goals: Progressing  Interventions: CBT, Solution Focused, and Supportive  Summary: Tamara Garrett is a 58 y.o. female who presents with a history of panic disorder and depression. She appeared alert and oriented x5. She reported the holidays went well and they went to a variety of gatherings. She managed these well. Tamara Garrett reported she has also reduced smoking to approximately 4 a day, down from 10 a day. She is proud of this progress. Tamara Garrett identified progress on goals and areas for continued improvement. She was in agreement with plan to address connecting to her feelings, vulnerability, and problematic thinking patterns.   Therapist Response: Conducted session with Tamara Garrett. Began session with check-in/update since previous session. Utilized empathetic and reflective listening. Used open-ended questions to facilitate discussion and summarized Tamara Garrett's thoughts/feelings. Reviewed treatment plan with input from [facility] on current strengths, needs, and progress towards goals. Brainstormed ways to work on connecting to emotions, being  vulnerable, and challenging negative thinking. Agreed upon journaling. Provided positive reinforcement with smoking cessation. Scheduled additional appointment and concluded session.   Suicidal/Homicidal: No  Plan: Return again in 3 weeks.  Diagnosis: MDD (major depressive disorder), recurrent episode, mild  Collaboration of Care:  Medication Management AEB chart review  Patient/Guardian was advised Release of Information must be obtained prior to any record release in order to collaborate their care with an outside provider. Patient/Guardian was advised if they have not already done so to contact the registration department to sign all necessary forms in order for us  to release information regarding their care.   Consent: Patient/Guardian gives verbal consent for treatment and assignment of benefits for services provided during this visit. Patient/Guardian expressed understanding and agreed to proceed.   Tamara Garrett, [facility] 01/19/2024  "

## 2024-02-04 ENCOUNTER — Encounter: Payer: Self-pay | Admitting: Psychiatry

## 2024-02-04 ENCOUNTER — Telehealth (INDEPENDENT_AMBULATORY_CARE_PROVIDER_SITE_OTHER): Admitting: Psychiatry

## 2024-02-04 DIAGNOSIS — F410 Panic disorder [episodic paroxysmal anxiety] without agoraphobia: Secondary | ICD-10-CM

## 2024-02-04 DIAGNOSIS — F3341 Major depressive disorder, recurrent, in partial remission: Secondary | ICD-10-CM | POA: Diagnosis not present

## 2024-02-04 NOTE — Progress Notes (Signed)
 Virtual Visit via Video Note  I connected with Tamara Garrett on 02/04/2024 at  1:20 PM EST by a video enabled telemedicine application and verified that I am speaking with the correct person using two identifiers.  Location Provider Location : [facility] Patient Location : Home  Participants: Patient , Provider   I discussed the limitations of evaluation and management by telemedicine and the availability of in person appointments. The patient expressed understanding and agreed to proceed.   I discussed the assessment and treatment plan with the patient. The patient was provided an opportunity to ask questions and all were answered. The patient agreed with the plan and demonstrated an understanding of the instructions.   The patient was advised to call back or seek an in-person evaluation if the symptoms worsen or if the condition fails to improve as anticipated.   BH MD OP Progress Note  02/04/2024 3:52 PM Tamara Garrett  MRN:  [id number]  Chief Complaint:  Chief Complaint  Patient presents with   Follow-up   Anxiety   Depression   Medication Refill   Discussed the use of AI scribe software for clinical note transcription with the patient, who gave verbal consent to proceed.  History of Present Illness Tamara Garrett is a 59 year old Caucasian female on disability, married, lives in [place], has a history of panic disorder, gastric bypass, sensorineural deafness, cerebrovascular disease, recurrent UTIs, chronic nausea, cognitive issues, back pain was evaluated by telemedicine today for a follow-up appointment.  She reports experiencing mild ongoing depression and notes some improvement in her mood over the past few days. Since her fluoxetine  dose increased at her last visit, she describes feeling better and expresses hope that the higher dose will continue to help.  She denies any side effects to fluoxetine .  She continues to be compliant on the Abilify  and denies any side  effects.  She reports that she has not experienced any significant anxiety symptoms or panic attacks since her last appointment. She confirms that she continues to take buspirone  30 mg twice daily and clonazepam  as needed, and she reports having a sufficient supply of her medications. She also notes that she stopped taking propranolol  recently without consulting her primary care provider.  Patient with history of bradycardia unknown if this likely from propranolol .  She reports that her sleep and appetite remain good. She denies any thoughts of harming herself or others.  She denies any perceptual disturbances.  She previously tried Chantix for smoking cessation without benefit.  She is currently working on smoking cessation.  Not interested in medications to assist with that.  She describes working with her therapist, Ms. Almarie, at least once a month, sometimes every three weeks, with sessions focused on increasing her activity outside the home and reducing isolation.     Visit Diagnosis:    ICD-10-CM   1. Recurrent major depressive disorder, in partial remission  F33.41     2. Panic disorder  F41.0       Past Psychiatric History: I have reviewed past psychiatric history from progress note on 11/13/2022.  Past trials of medications like Adderall, clonazepam , Prozac , BuSpar , propranolol , trazodone, venlafaxine, Seroquel.  Past Medical History:  Past Medical History:  Diagnosis Date   Anxiety    Chronic kidney disease    kidney stones   GERD (gastroesophageal reflux disease)     Past Surgical History:  Procedure Laterality Date   ABDOMINAL HYSTERECTOMY     BREAST BIOPSY Left 2005   benign  COLONOSCOPY     COLONOSCOPY WITH PROPOFOL  N/A 04/02/2016   Procedure: COLONOSCOPY WITH PROPOFOL ;  Surgeon: Gladis RAYMOND Mariner, MD;  Location: [facility] ENDOSCOPY;  Service: Endoscopy;  Laterality: N/A;   KIDNEY STONE SURGERY Right    LAPAROSCOPIC GASTRIC BYPASS      Family Psychiatric History:  I have reviewed family psychiatric history from progress note on 11/13/2022  Family History:  Family History  Problem Relation Age of Onset   Colon polyps Mother    COPD Mother    Congestive Heart Failure Mother    Diabetes Mother    Dementia Father    Stroke Father     Social History: I have reviewed social history from progress note on 11/13/2022 Social History   Socioeconomic History   Marital status: Married    Spouse name: Not on file   Number of children: 1   Years of education: Not on file   Highest education level: Some college, no degree  Occupational History   Not on file  Tobacco Use   Smoking status: Every Day    Current packs/day: 1.00    Types: Cigarettes   Smokeless tobacco: Never  Vaping Use   Vaping status: Former  Substance and Sexual Activity   Alcohol use: No   Drug use: No   Sexual activity: Not Currently  Other Topics Concern   Not on file  Social History Narrative   Not on file   Social Drivers of Health   Tobacco Use: High Risk (02/04/2024)   Patient History    Smoking Tobacco Use: Every Day    Smokeless Tobacco Use: Never    Passive Exposure: Not on file  Financial Resource Strain: Medium Risk (11/15/2023)   Received from [facility]   Overall Financial Resource Strain (CARDIA)    How hard is it for you to pay for the very basics like food, housing, medical care, and heating?: Somewhat hard  Food Insecurity: No Food Insecurity (11/15/2023)   Received from [facility]   Epic    Within the past 12 months, you worried that your food would run out before you got the money to buy more.: Never true    Within the past 12 months, the food you bought just didn't last and you didn't have money to get more.: Never true  Transportation Needs: No Transportation Needs (11/15/2023)   Received from [facility] - Transportation    Lack of Transportation (Medical): No    Lack of Transportation (Non-Medical): No  Physical  Activity: Insufficiently Active (02/20/2023)   Exercise Vital Sign    Days of Exercise per Week: 1 day    Minutes of Exercise per Session: 10 min  Stress: Stress Concern Present (02/20/2023)   [employer] of Occupational [employer] - Occupational Stress Questionnaire    Feeling of Stress : Very much  Social Connections: Socially Isolated (02/20/2023)   Social Connection and Isolation Panel    Frequency of Communication with Friends and Family: Twice a week    Frequency of Social Gatherings with Friends and Family: Never    Attends Religious Services: Never    Database Administrator or Organizations: No    Attends Banker Meetings: Never    Marital Status: Married  Depression (PHQ2-9): Medium Risk (01/05/2024)   Depression (PHQ2-9)    PHQ-2 Score: 10  Alcohol Screen: Low Risk (02/20/2023)   Alcohol Screen    Last Alcohol Screening Score (AUDIT): 0  Housing: Low  Risk (02/20/2023)   Housing Stability Vital Sign    Unable to Pay for Housing in the Last Year: No    Number of Times Moved in the Last Year: 0    Homeless in the Last Year: No  Utilities: High Risk (11/15/2023)   Received from [facility]   Utilities    Within the past 12 months, have you been unable to get utilities(heat, electricity) when it was really needed?: Yes  Health Literacy: Inadequate Health Literacy (02/20/2023)   B1300 Health Literacy    Frequency of need for help with medical instructions: Sometimes    Allergies: Allergies[1]  Metabolic Disorder Labs: Lab Results  Component Value Date   HGBA1C 5.6 05/28/2019   No results found for: PROLACTIN Lab Results  Component Value Date   CHOL 217 (H) 05/28/2019   TRIG 236 (H) 05/28/2019   HDL 36 (L) 05/28/2019   LDLCALC 138 (H) 05/28/2019   Lab Results  Component Value Date   TSH 2.340 01/21/2019    Therapeutic Level Labs: No results found for: LITHIUM No results found for: VALPROATE No results found for: CBMZ  Current  Medications: Current Outpatient Medications  Medication Sig Dispense Refill   ARIPiprazole  (ABILIFY ) 5 MG tablet TAKE 1 TABLET (5 MG TOTAL) BY MOUTH IN THE MORNING 90 tablet 1   aspirin EC 81 MG tablet Take 1 tablet by mouth daily.     busPIRone  (BUSPAR ) 30 MG tablet Take 1 tablet (30 mg total) by mouth 2 (two) times daily.     clonazePAM  (KLONOPIN ) 0.5 MG tablet Take 0.5-1 tablets (0.25-0.5 mg total) by mouth as directed. Take half to one tablet once a day as needed for severe panic attacks only, please give atleast 8 hours gap with your hydrocodone medication, do not combine. 8 tablet 0   diphenoxylate -atropine  (LOMOTIL ) 2.5-0.025 MG tablet Take 1 tablet by mouth 4 (four) times daily as needed for diarrhea or loose stools. 30 tablet 0   FLUoxetine  (PROZAC ) 40 MG capsule Take 2 capsules (80 mg total) by mouth daily. Stop the Fluoxetine  20 mg , dose change 180 capsule 1   gabapentin (NEURONTIN) 600 MG tablet 600 mg. Take 1 tab 3-4 times daily.     HYDROcodone-acetaminophen (NORCO) 10-325 MG tablet take 1 tablet by oral route  every 8 hours as needed for pain (DNF 04/21/23)     levothyroxine (SYNTHROID) 50 MCG tablet Take 50 mcg by mouth daily.     meclizine  (ANTIVERT ) 25 MG tablet Take 25 mg by mouth 3 (three) times daily as needed.     omeprazole  (PRILOSEC) 20 MG capsule TAKE 1 CAPSULE BY MOUTH EVERY DAY 90 capsule 1   phenazopyridine  (PYRIDIUM ) 200 MG tablet Take by mouth.     promethazine  (PHENERGAN ) 12.5 MG tablet Take by mouth.     rosuvastatin  (CRESTOR ) 20 MG tablet TAKE 1 TABLET BY MOUTH EVERY DAY 90 tablet 1   traZODone (DESYREL) 50 MG tablet Take 50 mg by mouth at bedtime as needed.     No current facility-administered medications for this visit.     Musculoskeletal: Strength & Muscle Tone: UTA Gait & Station: Seated Patient leans: N/A  Psychiatric Specialty Exam: Review of Systems  Psychiatric/Behavioral:  Positive for dysphoric mood.     There were no vitals taken for this  visit.There is no height or weight on file to calculate BMI.  General Appearance: Casual  Eye Contact:  Fair  Speech:  Clear and Coherent  Volume:  Normal  Mood:  Dysphoric improving  Affect:  Full Range  Thought Process:  Goal Directed and Descriptions of Associations: Intact  Orientation:  Full (Time, Place, and Person)  Thought Content: Logical   Suicidal Thoughts:  No  Homicidal Thoughts:  No  Memory:  Immediate;   Fair Recent;   Fair Remote;   Fair  Judgement:  Fair  Insight:  Fair  Psychomotor Activity:  Normal  Concentration:  Concentration: Fair and Attention Span: Fair  Recall:  [employer] of Knowledge: Fair  Language: Fair  Akathisia:  No  Handed:  Right  AIMS (if indicated): not done  Assets:  Communication Skills Desire for Improvement Housing Social Support  ADL's:  Intact  Cognition: WNL  Sleep:  Fair   Screenings: Geneticist, Molecular Office Visit from 12/03/2022 in [facility]  AIMS Total Score 0   GAD-7    Flowsheet Row Office Visit from [facility]05[facility]2024 in [facility] Counselor from 02/20/2023 in [facility] Office Visit from 11/13/2022 in [facility]  Total GAD-7 Score 4 7 8    PHQ2-9    Flowsheet Row Office Visit from 01/05/2024 in [facility] Video Visit from 07/14/2023 in [facility] Counselor from 02/20/2023 in [facility] Office Visit from 12/03/2022 in [facility] Office Visit from 11/13/2022 in [facility]  PHQ-2 Total Score 3 0 6 4 5   PHQ-9 Total Score 10 -- 18 11 17    Flowsheet Row Video Visit from 02/04/2024 in [facility] Office Visit from  01/05/2024 in [facility] Video Visit from 10/08/2023 in [facility]  C-SSRS RISK CATEGORY No Risk No Risk No Risk     Assessment and Plan: Abrie Egloff is a 59 year old Caucasian female who presented for a follow-up appointment, discussed assessment and plan as noted below.  1. Recurrent major depressive disorder, in partial remission Currently reports mood symptoms is improving on the current dosage of fluoxetine . Continue Fluoxetine  80 mg daily Continue Abilify  5 mg daily.   Will consider increasing the dosage of Abilify  in the future pending EKG. Continue BuSpar  30 mg twice a day Continue Trazodone 50 mg at bedtime. Continue CBT with Ms. Almarie Ligas.  2. Panic disorder-stable Currently reports anxiety as managed on the current medication regimen and denies panic attacks Continue Fluoxetine  and BuSpar  as prescribed Continue Clonazepam  0.5 mg as needed for severe panic attacks.  Patient limiting use. Discontinue Propranolol  for possible side effects. Reviewed [place] PMP AWARxE  Follow-up Follow-up in clinic in 3 months or sooner in person.   Collaboration of Care: Collaboration of Care: Referral or follow-up with counselor/therapist AEB patient encouraged to continue CBT  Patient/Guardian was advised Release of Information must be obtained prior to any record release in order to collaborate their care with an outside provider. Patient/Guardian was advised if they have not already done so to contact the registration department to sign all necessary forms in order for us  to release information regarding their care.   Consent: Patient/Guardian gives verbal consent for treatment and assignment of benefits for services provided during this visit. Patient/Guardian expressed understanding and agreed to proceed.   This note was generated in part or whole with voice recognition software. Voice recognition  is usually quite  accurate but there are transcription errors that can and very often do occur. I apologize for any typographical errors that were not detected and corrected.    Tamara Totten, MD 02/04/2024, 3:52 PM     [1]  Allergies Allergen Reactions   Nsaids Other (See Comments)    Patient has had gastric bypass.     Other     Had Gastric Bypass   Sulfamethoxazole Other (See Comments)   Sulfamethoxazole-Trimethoprim Other (See Comments)   Trimethoprim Other (See Comments)

## 2024-02-09 ENCOUNTER — Ambulatory Visit: Admitting: Professional Counselor

## 2024-02-09 DIAGNOSIS — F410 Panic disorder [episodic paroxysmal anxiety] without agoraphobia: Secondary | ICD-10-CM

## 2024-02-09 DIAGNOSIS — F3341 Major depressive disorder, recurrent, in partial remission: Secondary | ICD-10-CM | POA: Diagnosis not present

## 2024-02-09 NOTE — Progress Notes (Signed)
 " THERAPIST PROGRESS NOTE  Virtual Visit via Video Note  I connected with [name] on 02/09/24 at 11:00 AM EST by a video enabled telemedicine application and verified that I am speaking with the correct person using two identifiers.  Location: Patient: Home Provider: Remote office   I discussed the limitations of evaluation and management by telemedicine and the availability of in person appointments. The patient expressed understanding and agreed to proceed.   I discussed the assessment and treatment plan with the patient. The patient was provided an opportunity to ask questions and all were answered. The patient agreed with the plan and demonstrated an understanding of the instructions.   The patient was advised to call back or seek an in-person evaluation if the symptoms worsen or if the condition fails to improve as anticipated.  I provided 47 minutes of non-face-to-face time during this encounter. [name], [facility]  Session Time: 11:00 AM - 11:47 AM  Participation Level: Active  Behavioral Response: Casual, Alert, Dysphoric  Type of Therapy: Individual Therapy  Treatment Goals addressed: Active OP Depression  LTG: I'm just stuck in this setting, in this chair, and in this depression, even with medication I haven't been able to pull myself out of it. I give up so easy now. I used to be the main person in our family that stood up and took over everything. (Completed/Met)                Start:  03/03/23    Expected End:  03/01/24    Resolved:  09/29/23 Goal Note Reviewed 09/29/23 - Yeah, I feel like I've done pretty good on that. Its taken me awhile but getting out. I don't do it everyday but I get out more than I have in the last several years. So I do feel good about that.    STG: I really need to focus on getting my house back in order. I just let it go and I do the bare minimum. To manage ADLs AEB utilizing coping strategies to complete tasks on a daily  basis over the next 90 days.  (Progressing)  Goal Note Reviewed 01/19/24 - I had started on it, but Christmas came. I'll get back on that after the new year.    STG: I don't let things out. I just say everything's okay, everything's fine. To acknowledge and process emotions AEB engaging in talk therapy and sharing thoughts/feelings within personal relationships over the next 12 weeks.  (Progressing)  Goal Note Reviewed 01/19/24 - I still have that little issue. Agreed to start journaling to get in practice of writing what is true.   ProgressTowards Goals: Progressing  Interventions: CBT, Motivational Interviewing, and Supportive  Summary: Luisa Louk is a 59 y.o. female who presents with a history of depression and panic disorder. She appeared alert and oriented x5. She stated things have been slow lately. She did get out and enjoy grandchildren's basketball games. Tyresa expressed the struggle of getting through the colder months. She reported she hasn't had any significant depressive episodes though. She shared that she will often mask it anyway, especially because she doesn't want her husband to worry. Kisha engaged in exploring where this behavior stems from. She discussed family dynamics and experiences during childhood. She also identified how this affects her people pleasing behavior. She noted she can continue to try to work on this. Indira expressed interest in returning to work. She reported she needs to practice driving again to get  over the fear she has developed. She looks forward to becoming more independent.   Therapist Response: Conducted session with [name]. Began session with check-in/update since previous session. Utilized empathetic and reflective listening. Used open-ended questions to facilitate discussion and summarized Amrit's thoughts/feelings. Explored contributing factors to struggle with vulnerability, such as Altamese's belief that emotions equal weakness. Discussed  ways to reduce masking and people pleasing behaviors, starting with herself - name it to tame it. Reinforced change talk around driving and returning to the work force. Scheduled additional appointment and concluded session.   Suicidal/Homicidal: No  Plan: Return again in 3 weeks.  Diagnosis: Recurrent major depressive disorder, in partial remission  Panic disorder  Collaboration of Care: Medication Management AEB chart review  Patient/Guardian was advised Release of Information must be obtained prior to any record release in order to collaborate their care with an outside provider. Patient/Guardian was advised if they have not already done so to contact the registration department to sign all necessary forms in order for us  to release information regarding their care.   Consent: Patient/Guardian gives verbal consent for treatment and assignment of benefits for services provided during this visit. Patient/Guardian expressed understanding and agreed to proceed.   [name], [facility] 02/09/2024  "

## 2024-02-10 ENCOUNTER — Other Ambulatory Visit: Payer: Self-pay | Admitting: Psychiatry

## 2024-02-10 DIAGNOSIS — F410 Panic disorder [episodic paroxysmal anxiety] without agoraphobia: Secondary | ICD-10-CM

## 2024-03-08 ENCOUNTER — Ambulatory Visit: Admitting: Professional Counselor

## 2024-05-17 ENCOUNTER — Ambulatory Visit: Admitting: Psychiatry
